# Patient Record
Sex: Female | Born: 1995 | Race: White | Hispanic: No | Smoking: Current every day smoker
Health system: Southern US, Community
[De-identification: ages and names within clinical notes are randomized; demographics above are authoritative.]

## PROBLEM LIST (undated history)

## (undated) DIAGNOSIS — M4650 Other infective spondylopathies, site unspecified: Secondary | ICD-10-CM

## (undated) DIAGNOSIS — F419 Anxiety disorder, unspecified: Secondary | ICD-10-CM

## (undated) DIAGNOSIS — N390 Urinary tract infection, site not specified: Secondary | ICD-10-CM

## (undated) DIAGNOSIS — N611 Abscess of the breast and nipple: Secondary | ICD-10-CM

## (undated) DIAGNOSIS — Z8679 Personal history of other diseases of the circulatory system: Secondary | ICD-10-CM

## (undated) DIAGNOSIS — F4310 Post-traumatic stress disorder, unspecified: Secondary | ICD-10-CM

## (undated) DIAGNOSIS — T7840XA Allergy, unspecified, initial encounter: Secondary | ICD-10-CM

## (undated) DIAGNOSIS — R87629 Unspecified abnormal cytological findings in specimens from vagina: Secondary | ICD-10-CM

## (undated) DIAGNOSIS — N61 Mastitis without abscess: Principal | ICD-10-CM

## (undated) DIAGNOSIS — J45909 Unspecified asthma, uncomplicated: Secondary | ICD-10-CM

## (undated) DIAGNOSIS — J189 Pneumonia, unspecified organism: Secondary | ICD-10-CM

## (undated) DIAGNOSIS — F32A Depression, unspecified: Secondary | ICD-10-CM

## (undated) HISTORY — DX: Unspecified asthma, uncomplicated: J45.909

## (undated) HISTORY — DX: Unspecified abnormal cytological findings in specimens from vagina: R87.629

## (undated) HISTORY — DX: Anxiety disorder, unspecified: F41.9

## (undated) HISTORY — DX: Other infective spondylopathies, site unspecified: M46.50

## (undated) HISTORY — PX: MYRINGOTOMY: SUR874

## (undated) HISTORY — DX: Personal history of other diseases of the circulatory system: Z86.79

## (undated) HISTORY — DX: Mastitis without abscess: N61.0

## (undated) HISTORY — DX: Abscess of the breast and nipple: N61.1

## (undated) HISTORY — DX: Urinary tract infection, site not specified: N39.0

## (undated) HISTORY — DX: Depression, unspecified: F32.A

## (undated) HISTORY — PX: NO PAST SURGERIES: SHX2092

## (undated) HISTORY — DX: Post-traumatic stress disorder, unspecified: F43.10

## (undated) HISTORY — DX: Allergy, unspecified, initial encounter: T78.40XA

---
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
## 2013-01-18 ENCOUNTER — Ambulatory Visit (INDEPENDENT_AMBULATORY_CARE_PROVIDER_SITE_OTHER): Payer: No Typology Code available for payment source | Admitting: Obstetrics & Gynecology

## 2013-01-18 ENCOUNTER — Encounter: Payer: Self-pay | Admitting: Obstetrics & Gynecology

## 2013-01-18 VITALS — BP 120/60 | Wt 177.0 lb

## 2013-01-18 DIAGNOSIS — O99891 Other specified diseases and conditions complicating pregnancy: Secondary | ICD-10-CM

## 2013-01-18 DIAGNOSIS — O99330 Smoking (tobacco) complicating pregnancy, unspecified trimester: Secondary | ICD-10-CM

## 2013-01-18 DIAGNOSIS — Z331 Pregnant state, incidental: Secondary | ICD-10-CM

## 2013-01-18 DIAGNOSIS — Z1389 Encounter for screening for other disorder: Secondary | ICD-10-CM

## 2013-01-18 DIAGNOSIS — Z3403 Encounter for supervision of normal first pregnancy, third trimester: Secondary | ICD-10-CM

## 2013-01-18 LAB — POCT URINALYSIS DIPSTICK
Blood, UA: NEGATIVE
Ketones, UA: NEGATIVE
Leukocytes, UA: NEGATIVE

## 2013-01-18 NOTE — Progress Notes (Signed)
BP weight and urine results all reviewed and noted. Patient reports good fetal movement, denies any bleeding and no rupture of membranes symptoms or regular contractions. Patient is without complaints. All questions were answered.  

## 2013-02-01 ENCOUNTER — Encounter: Payer: Self-pay | Admitting: Obstetrics & Gynecology

## 2013-02-01 ENCOUNTER — Ambulatory Visit (INDEPENDENT_AMBULATORY_CARE_PROVIDER_SITE_OTHER): Payer: No Typology Code available for payment source | Admitting: Obstetrics & Gynecology

## 2013-02-01 VITALS — BP 140/70 | Wt 179.0 lb

## 2013-02-01 DIAGNOSIS — Z3403 Encounter for supervision of normal first pregnancy, third trimester: Secondary | ICD-10-CM

## 2013-02-01 DIAGNOSIS — Z1389 Encounter for screening for other disorder: Secondary | ICD-10-CM

## 2013-02-01 DIAGNOSIS — O99019 Anemia complicating pregnancy, unspecified trimester: Secondary | ICD-10-CM

## 2013-02-01 LAB — POCT URINALYSIS DIPSTICK
Glucose, UA: NEGATIVE
Leukocytes, UA: NEGATIVE
Nitrite, UA: NEGATIVE

## 2013-02-01 NOTE — Progress Notes (Signed)
BP weight and urine results all reviewed and noted. Patient reports good fetal movement, denies any bleeding and no rupture of membranes symptoms or regular contractions. Patient is without complaints. All questions were answered.  

## 2013-02-01 NOTE — Patient Instructions (Signed)

## 2013-02-14 ENCOUNTER — Ambulatory Visit (INDEPENDENT_AMBULATORY_CARE_PROVIDER_SITE_OTHER): Payer: No Typology Code available for payment source | Admitting: Obstetrics and Gynecology

## 2013-02-14 VITALS — BP 128/60 | Wt 181.8 lb

## 2013-02-14 DIAGNOSIS — Z331 Pregnant state, incidental: Secondary | ICD-10-CM

## 2013-02-14 DIAGNOSIS — O2390 Unspecified genitourinary tract infection in pregnancy, unspecified trimester: Secondary | ICD-10-CM

## 2013-02-14 DIAGNOSIS — O99019 Anemia complicating pregnancy, unspecified trimester: Secondary | ICD-10-CM

## 2013-02-14 DIAGNOSIS — Z3400 Encounter for supervision of normal first pregnancy, unspecified trimester: Secondary | ICD-10-CM

## 2013-02-14 DIAGNOSIS — Z1389 Encounter for screening for other disorder: Secondary | ICD-10-CM

## 2013-02-14 LAB — POCT URINALYSIS DIPSTICK
Nitrite, UA: NEGATIVE
Protein, UA: NEGATIVE

## 2013-02-14 NOTE — Patient Instructions (Signed)
Fetal Movement Counts Patient Name: __________________________________________________ Patient Due Date: ____________________ Performing a fetal movement count is highly recommended in high-risk pregnancies, but it is good for every pregnant woman to do. Your caregiver may ask you to start counting fetal movements at 28 weeks of the pregnancy. Fetal movements often increase:  After eating a full meal.  After physical activity.  After eating or drinking something sweet or cold.  At rest. Pay attention to when you feel the baby is most active. This will help you notice a pattern of your baby's sleep and wake cycles and what factors contribute to an increase in fetal movement. It is important to perform a fetal movement count at the same time each day when your baby is normally most active.  HOW TO COUNT FETAL MOVEMENTS 1. Find a quiet and comfortable area to sit or lie down on your left side. Lying on your left side provides the best blood and oxygen circulation to your baby. 2. Write down the day and time on a sheet of paper or in a journal. 3. Start counting kicks, flutters, swishes, rolls, or jabs in a 2 hour period. You should feel at least 10 movements within 2 hours. 4. If you do not feel 10 movements in 2 hours, wait 2 3 hours and count again. Look for a change in the pattern or not enough counts in 2 hours. SEEK MEDICAL CARE IF:  You feel less than 10 counts in 2 hours, tried twice.  There is no movement in over an hour.  The pattern is changing or taking longer each day to reach 10 counts in 2 hours.  You feel the baby is not moving as he or she usually does. Date: ____________ Movements: ____________ Start time: ____________ Finish time: ____________  Date: ____________ Movements: ____________ Start time: ____________ Finish time: ____________ Date: ____________ Movements: ____________ Start time: ____________ Finish time: ____________ Date: ____________ Movements: ____________  Start time: ____________ Finish time: ____________ Date: ____________ Movements: ____________ Start time: ____________ Finish time: ____________ Date: ____________ Movements: ____________ Start time: ____________ Finish time: ____________ Date: ____________ Movements: ____________ Start time: ____________ Finish time: ____________ Date: ____________ Movements: ____________ Start time: ____________ Finish time: ____________  Date: ____________ Movements: ____________ Start time: ____________ Finish time: ____________ Date: ____________ Movements: ____________ Start time: ____________ Finish time: ____________ Date: ____________ Movements: ____________ Start time: ____________ Finish time: ____________ Date: ____________ Movements: ____________ Start time: ____________ Finish time: ____________ Date: ____________ Movements: ____________ Start time: ____________ Finish time: ____________ Date: ____________ Movements: ____________ Start time: ____________ Finish time: ____________ Date: ____________ Movements: ____________ Start time: ____________ Finish time: ____________  Date: ____________ Movements: ____________ Start time: ____________ Finish time: ____________ Date: ____________ Movements: ____________ Start time: ____________ Finish time: ____________ Date: ____________ Movements: ____________ Start time: ____________ Finish time: ____________ Date: ____________ Movements: ____________ Start time: ____________ Finish time: ____________ Date: ____________ Movements: ____________ Start time: ____________ Finish time: ____________ Date: ____________ Movements: ____________ Start time: ____________ Finish time: ____________ Date: ____________ Movements: ____________ Start time: ____________ Finish time: ____________  Date: ____________ Movements: ____________ Start time: ____________ Finish time: ____________ Date: ____________ Movements: ____________ Start time: ____________ Finish time:  ____________ Date: ____________ Movements: ____________ Start time: ____________ Finish time: ____________ Date: ____________ Movements: ____________ Start time: ____________ Finish time: ____________ Date: ____________ Movements: ____________ Start time: ____________ Finish time: ____________ Date: ____________ Movements: ____________ Start time: ____________ Finish time: ____________ Date: ____________ Movements: ____________ Start time: ____________ Finish time: ____________  Date: ____________ Movements: ____________ Start time: ____________ Finish   time: ____________ Date: ____________ Movements: ____________ Start time: ____________ Finish time: ____________ Date: ____________ Movements: ____________ Start time: ____________ Finish time: ____________ Date: ____________ Movements: ____________ Start time: ____________ Finish time: ____________ Date: ____________ Movements: ____________ Start time: ____________ Finish time: ____________ Date: ____________ Movements: ____________ Start time: ____________ Finish time: ____________ Date: ____________ Movements: ____________ Start time: ____________ Finish time: ____________  Date: ____________ Movements: ____________ Start time: ____________ Finish time: ____________ Date: ____________ Movements: ____________ Start time: ____________ Finish time: ____________ Date: ____________ Movements: ____________ Start time: ____________ Finish time: ____________ Date: ____________ Movements: ____________ Start time: ____________ Finish time: ____________ Date: ____________ Movements: ____________ Start time: ____________ Finish time: ____________ Date: ____________ Movements: ____________ Start time: ____________ Finish time: ____________ Date: ____________ Movements: ____________ Start time: ____________ Finish time: ____________  Date: ____________ Movements: ____________ Start time: ____________ Finish time: ____________ Date: ____________ Movements:  ____________ Start time: ____________ Finish time: ____________ Date: ____________ Movements: ____________ Start time: ____________ Finish time: ____________ Date: ____________ Movements: ____________ Start time: ____________ Finish time: ____________ Date: ____________ Movements: ____________ Start time: ____________ Finish time: ____________ Date: ____________ Movements: ____________ Start time: ____________ Finish time: ____________ Date: ____________ Movements: ____________ Start time: ____________ Finish time: ____________  Date: ____________ Movements: ____________ Start time: ____________ Finish time: ____________ Date: ____________ Movements: ____________ Start time: ____________ Finish time: ____________ Date: ____________ Movements: ____________ Start time: ____________ Finish time: ____________ Date: ____________ Movements: ____________ Start time: ____________ Finish time: ____________ Date: ____________ Movements: ____________ Start time: ____________ Finish time: ____________ Date: ____________ Movements: ____________ Start time: ____________ Finish time: ____________ Document Released: 08/31/2006 Document Revised: 07/18/2012 Document Reviewed: 05/28/2012 [employer] Patient Information 2014 [employer].  

## 2013-02-14 NOTE — Progress Notes (Signed)
C/o of white vaginal discharge with irritation and itching, states "thinks I have a UTI." GBS and GC/CHL today

## 2013-02-15 LAB — URINALYSIS, ROUTINE W REFLEX MICROSCOPIC
Bilirubin Urine: NEGATIVE
Ketones, ur: NEGATIVE mg/dL
Specific Gravity, Urine: 1.009 (ref 1.005–1.030)
Urobilinogen, UA: 0.2 mg/dL (ref 0.0–1.0)
pH: 7.5 (ref 5.0–8.0)

## 2013-02-15 LAB — URINALYSIS, MICROSCOPIC ONLY
Bacteria, UA: NONE SEEN
Casts: NONE SEEN

## 2013-02-15 LAB — OB RESULTS CONSOLE GC/CHLAMYDIA: Chlamydia: NEGATIVE

## 2013-02-16 LAB — STREP B DNA PROBE: GBSP: NEGATIVE

## 2013-02-19 ENCOUNTER — Telehealth: Payer: Self-pay | Admitting: Obstetrics and Gynecology

## 2013-02-19 MED ORDER — NYSTATIN-TRIAMCINOLONE 100000-0.1 UNIT/GM-% EX OINT: TOPICAL_OINTMENT | Freq: Two times a day (BID) | CUTANEOUS | Status: DC

## 2013-02-19 MED ORDER — FLUCONAZOLE 150 MG PO TABS: 150.0000 mg | ORAL_TABLET | Freq: Once | ORAL | Status: DC

## 2013-02-19 NOTE — Addendum Note (Signed)
Addended by: Tilda Burrow on: 02/19/2013 08:57 AM   Modules accepted: Kipp Brood

## 2013-02-19 NOTE — Progress Notes (Signed)
Culture positive for Yeast, will rx Diflucan and Mytrex/Mycolog. Pt to be notified by telephone jvf

## 2013-02-19 NOTE — Telephone Encounter (Signed)
Pt aware of Rx's for yeast infxn.

## 2013-02-22 ENCOUNTER — Encounter: Payer: Self-pay | Admitting: Obstetrics and Gynecology

## 2013-02-22 ENCOUNTER — Ambulatory Visit (INDEPENDENT_AMBULATORY_CARE_PROVIDER_SITE_OTHER): Payer: No Typology Code available for payment source | Admitting: Obstetrics and Gynecology

## 2013-02-22 VITALS — BP 120/72 | Wt 180.4 lb

## 2013-02-22 DIAGNOSIS — Z1389 Encounter for screening for other disorder: Secondary | ICD-10-CM

## 2013-02-22 DIAGNOSIS — Z3493 Encounter for supervision of normal pregnancy, unspecified, third trimester: Secondary | ICD-10-CM

## 2013-02-22 DIAGNOSIS — O99019 Anemia complicating pregnancy, unspecified trimester: Secondary | ICD-10-CM

## 2013-02-22 LAB — POCT URINALYSIS DIPSTICK
Blood, UA: NEGATIVE
Glucose, UA: NEGATIVE
Nitrite, UA: NEGATIVE

## 2013-02-22 NOTE — Patient Instructions (Signed)
Pain Relief During Labor and Delivery All women are different when it comes to having pain. The amount of pain that a woman experiences during labor and delivery depends on their pain tolerance, contraction strength and the baby's size and the position.  There are many ways to prepare and deal with the pain. These ways include:  Taking prenatal classes to learn about labor and delivery. The more informed you are, the less anxious and afraid you may be, which can help lessen the pain.  Taking medication or anesthetics during labor and delivery.  Women preferring natural childbirth learn breathing and relaxation techniques and how to relax between contractions to control their pain. They may take a shower or bath, have their partner massage or place an ice pack on their back, or may just want to change positions during labor. METHODS AVAILABLE TO CONTROL PAIN:  Medications may be given by injection into the muscles or vein (intravenously) to ease the pain. These medications can be given along with another medication (barbiturates) to relax you. These medications keep you awake. There can be minor side effects such as nausea, trouble concentrating, becoming sleepy and lowering the heart rate of the baby. However, the dose given will not seriously affect the baby.  Paracervical block is given during labor by injecting numbing medication into the right and left sides of the cervix and vagina. This is found to relieve most of the labor pains. It may have to be given more than once.  A local anesthetic may be injected under the skin, on the outside of the vagina, in the perineal area to perform a small cut (episiotomy). This cut is done to prevent tearing of the vagina at the time of delivery.  Pudendal anesthesia is an injection given deep through the perineum into the area to the pudendal nerves. This numbs the outside of the vagina and perineum at the time of delivery.  Epidural anesthesia is an  injection of numbing medication into the area of the lower back and spinal column. This anesthetic is injected on the outside covering of the spinal cord. An epidural numbs the lower part of the body. It can be given continuously in small doses through a tube for prolonged anesthesia. It can also be used to do a Cesarean section. You are able to move your legs, but not allowed to walk. Epidural is a very common and popular form of anesthesia during labor and delivery. It should only be given by a trained and experienced anesthesiologist or anesthetist. Side effects may occur, such as:  Headache.  Drop in blood pressure.  Backache.  Dizziness.  Difficulty breathing if it affects your chest muscles.  Shivering.  Slowing down the contractions.  Require forceps or vacuum extraction delivery.  Higher incidence of Cesarean Section.  Rarely it can cause a convulsion.  Spinal block anesthesia is an injection of numbing medication into the lower back and spinal column area. However, this anesthetic is injected on the inside of the covering of the spinal cord. Spinal block anesthesia is only given once. Therefore, it is best to be given just before delivering the baby. It can also be used for Cesarean sections. The side effects are the same as the epidural side effects. It should only be given by a trained anesthesiologist or anesthetist. Whether a person takes medication or not during labor and delivery will depend on the needs of each individual and situation. Do not be afraid to ask for pain medication if you need  it. There is no reason to be ashamed, embarrassed or feel that you failed yourself if you take medication. You should discuss how you and your caregiver plan to control your pain during your prenatal visits. It may also be a good idea to talk to an anesthesiologist before your due date. Document Released: 11/17/2008 Document Revised: 10/24/2011 Document Reviewed: 11/17/2008 [facility]  Patient Information 2014 [place], [place].

## 2013-02-22 NOTE — Progress Notes (Signed)
Pt here today for routine visiemt. Pt states she can't sleep at night. Pt denies any other problems or issues at this time.

## 2013-02-22 NOTE — Progress Notes (Signed)
Good fm, - bleeding ,- dischg,-SROM, + Braxton Hicks q1h... A stable preg [redacted]w[redacted]d. GBS neg  P: routine pnc. jvf

## 2013-02-28 ENCOUNTER — Ambulatory Visit (INDEPENDENT_AMBULATORY_CARE_PROVIDER_SITE_OTHER): Payer: No Typology Code available for payment source | Admitting: Obstetrics & Gynecology

## 2013-02-28 ENCOUNTER — Encounter: Payer: Self-pay | Admitting: Obstetrics & Gynecology

## 2013-02-28 VITALS — BP 138/78 | Wt 183.5 lb

## 2013-02-28 DIAGNOSIS — Z331 Pregnant state, incidental: Secondary | ICD-10-CM

## 2013-02-28 DIAGNOSIS — Z1389 Encounter for screening for other disorder: Secondary | ICD-10-CM

## 2013-02-28 DIAGNOSIS — O99019 Anemia complicating pregnancy, unspecified trimester: Secondary | ICD-10-CM

## 2013-02-28 LAB — POCT URINALYSIS DIPSTICK
Blood, UA: NEGATIVE
Glucose, UA: NEGATIVE
Ketones, UA: NEGATIVE

## 2013-02-28 NOTE — Patient Instructions (Signed)
Labor Induction  Most women go into labor on their own between 37 and 42 weeks of the pregnancy. When this does not happen or when there is a medical need, medicine or other methods may be used to induce labor. Labor induction causes a pregnant woman's uterus to contract. It also causes the cervix to soften (ripen), open (dilate), and thin out (efface). Usually, labor is not induced before 39 weeks of the pregnancy unless there is a problem with the baby or mother. Whether your labor will be induced depends on a number of factors, including the following:  The medical condition of you and the baby.  How many weeks along you are.  The status of baby's lung maturity.  The condition of the cervix.  The position of the baby. REASONS FOR LABOR INDUCTION  The health of the baby or mother is at risk.  The pregnancy is overdue by 1 week or more.  The water breaks but labor does not start on its own.  The mother has a health condition or serious illness such as high blood pressure, infection, placental abruption, or diabetes.  The amniotic fluid amounts are low around the baby.  The baby is distressed. REASONS TO NOT INDUCE LABOR Labor induction may not be a good idea if:  It is shown that your baby does not tolerate labor.  An induction is just more convenient.  You want the baby to be born on a certain date, like a holiday.  You have had previous surgeries on your uterus, such as a myomectomy or the removal of fibroids.  Your placenta lies very low in the uterus and blocks the opening of the cervix (placenta previa).  Your baby is not in a head down position.  The umbilical cord drops down into the birth canal in front of the baby. This could cut off the baby's blood and oxygen supply.  You have had a previous cesarean delivery.  There areunusual circumstances, such as the baby being extremely premature. RISKS AND COMPLICATIONS Problems may occur in the process of induction  and plans may need to be modified as a situation unfolds. Some of the risks of induction include:  Change in fetal heart rate, such as too high, too low, or erratic.  Risk of fetal distress.  Risk of infection to mother and baby.  Increased chance of having a cesarean delivery.  The rare, but increased chance that the placenta will separate from the uterus (abruption).  Uterine rupture (very rare). When induction is needed for medical reasons, the benefits of induction may outweigh the risks. BEFORE THE PROCEDURE Your caregiver will check your cervix and the baby's position. This will help your caregiver decide if you are far enough along for an induction to work. PROCEDURE Several methods of labor induction may be used, such as:   Taking prostaglandin medicine to dilate and ripen the cervix. The medicine will also start contractions. It can be taken by mouth or by inserting a suppository into the vagina.  A thin tube (catheter) with a balloon on the end may be inserted into your vagina to dilate the cervix. Once inserted, the balloon expands with water, which causes the cervix to open.  Striping the membranes. Your caregiver inserts a finger between the cervix and membranes, which causes the cervix to be stretched and may cause the uterus to contract. This is often done during an office visit. You will be sent home to wait for the contractions to begin. You will   then come in for an induction.  Breaking the water. Your caregiver will make a hole in the amniotic sac using a small instrument. Once the amniotic sac breaks, contractions should begin. This may still take hours to see an effect.  Taking medicine to trigger or strengthen contractions. This medicine is given intravenously through a tube in your arm. All of the methods of induction, besides stripping the membranes, will be done in the hospital. Induction is done in the hospital so that you and the baby can be carefully  monitored. AFTER THE PROCEDURE Some inductions can take up to 2 or 3 days. Depending on the cervix, it usually takes less time. It takes longer when you are induced early in the pregnancy or if this is your first pregnancy. If a mother is still pregnant and the induction has been going on for 2 to 3 days, either the mother will be sent home or a cesarean delivery will be needed. Document Released: 12/21/2006 Document Revised: 10/24/2011 Document Reviewed: 06/06/2011 [employer] Patient Information 2014 [employer].  

## 2013-02-28 NOTE — Progress Notes (Signed)
Recheck BP 138/78 BP weight and urine results all reviewed and noted. Patient reports good fetal movement, denies any bleeding and no rupture of membranes symptoms or regular contractions. Patient is without complaints. All questions were answered.

## 2013-03-07 ENCOUNTER — Other Ambulatory Visit: Payer: Self-pay | Admitting: *Deleted

## 2013-03-08 ENCOUNTER — Encounter: Payer: No Typology Code available for payment source | Admitting: Obstetrics and Gynecology

## 2013-03-09 MED ORDER — NYSTATIN-TRIAMCINOLONE 100000-0.1 UNIT/GM-% EX OINT: TOPICAL_OINTMENT | Freq: Two times a day (BID) | CUTANEOUS | Status: DC

## 2013-03-11 ENCOUNTER — Ambulatory Visit (INDEPENDENT_AMBULATORY_CARE_PROVIDER_SITE_OTHER): Payer: No Typology Code available for payment source | Admitting: Obstetrics and Gynecology

## 2013-03-11 VITALS — BP 150/60 | Wt 185.5 lb

## 2013-03-11 DIAGNOSIS — Z331 Pregnant state, incidental: Secondary | ICD-10-CM

## 2013-03-11 DIAGNOSIS — Z3403 Encounter for supervision of normal first pregnancy, third trimester: Secondary | ICD-10-CM

## 2013-03-11 DIAGNOSIS — O99019 Anemia complicating pregnancy, unspecified trimester: Secondary | ICD-10-CM

## 2013-03-11 DIAGNOSIS — Z1389 Encounter for screening for other disorder: Secondary | ICD-10-CM

## 2013-03-11 LAB — POCT URINALYSIS DIPSTICK
Blood, UA: NEGATIVE
Ketones, UA: NEGATIVE
Protein, UA: NEGATIVE

## 2013-03-11 NOTE — Patient Instructions (Signed)

## 2013-03-11 NOTE — Progress Notes (Signed)
C/o irregular contractions with pain. Excited re delivery. C/o  Mild headaches , chronic, over the last 2 weeks, along with nausea. No Ruq pain.  Generous contractions in evenings. BP RECHECK  150/70. Will check PIH labs.  Rechk 1 day. 10 am

## 2013-03-12 ENCOUNTER — Encounter: Payer: Self-pay | Admitting: Women's Health

## 2013-03-12 ENCOUNTER — Telehealth: Payer: Self-pay | Admitting: Obstetrics and Gynecology

## 2013-03-12 ENCOUNTER — Ambulatory Visit (INDEPENDENT_AMBULATORY_CARE_PROVIDER_SITE_OTHER): Payer: No Typology Code available for payment source | Admitting: Women's Health

## 2013-03-12 VITALS — BP 142/66 | Wt 183.0 lb

## 2013-03-12 DIAGNOSIS — O99320 Drug use complicating pregnancy, unspecified trimester: Secondary | ICD-10-CM

## 2013-03-12 DIAGNOSIS — O4400 Placenta previa specified as without hemorrhage, unspecified trimester: Secondary | ICD-10-CM

## 2013-03-12 DIAGNOSIS — O99019 Anemia complicating pregnancy, unspecified trimester: Secondary | ICD-10-CM

## 2013-03-12 DIAGNOSIS — Z1389 Encounter for screening for other disorder: Secondary | ICD-10-CM

## 2013-03-12 DIAGNOSIS — O99330 Smoking (tobacco) complicating pregnancy, unspecified trimester: Secondary | ICD-10-CM

## 2013-03-12 DIAGNOSIS — F1920 Other psychoactive substance dependence, uncomplicated: Secondary | ICD-10-CM

## 2013-03-12 DIAGNOSIS — O210 Mild hyperemesis gravidarum: Secondary | ICD-10-CM

## 2013-03-12 LAB — POCT URINALYSIS DIPSTICK
Leukocytes, UA: NEGATIVE
Nitrite, UA: NEGATIVE
Protein, UA: NEGATIVE

## 2013-03-12 LAB — COMPREHENSIVE METABOLIC PANEL
AST: 11 U/L (ref 0–37)
Alkaline Phosphatase: 222 U/L — ABNORMAL HIGH (ref 39–117)
BUN: 5 mg/dL — ABNORMAL LOW (ref 6–23)
Calcium: 8.7 mg/dL (ref 8.4–10.5)
Chloride: 103 mEq/L (ref 96–112)
Creat: 0.47 mg/dL — ABNORMAL LOW (ref 0.50–1.10)
Total Bilirubin: 0.3 mg/dL (ref 0.3–1.2)

## 2013-03-12 LAB — CBC
HCT: 28.7 % — ABNORMAL LOW (ref 36.0–46.0)
MCH: 24.6 pg — ABNORMAL LOW (ref 26.0–34.0)
MCHC: 32.8 g/dL (ref 30.0–36.0)
MCV: 75.1 fL — ABNORMAL LOW (ref 78.0–100.0)
RDW: 16.1 % — ABNORMAL HIGH (ref 11.5–15.5)

## 2013-03-12 LAB — PROTEIN / CREATININE RATIO, URINE: Protein Creatinine Ratio: 0.06 (ref <0.15)

## 2013-03-12 NOTE — Progress Notes (Signed)
Pt here today to recheck BP. Pt states she is still having irregular contractions and a lot of discharge. Pt denies any gush of fluid and bleeding.

## 2013-03-12 NOTE — Progress Notes (Signed)
Spoke with Dr. Emelda Fear and he advised that he would look over the pt's chart and call her, and probably send her down to [facility] this afternoon. The pt is aware of the plan and went home to rest, until Dr. Emelda Fear calls.

## 2013-03-12 NOTE — Telephone Encounter (Signed)
Dr. Emelda Fear spoke with pt concerning B/P today and induction this weekend.

## 2013-03-13 ENCOUNTER — Telehealth (HOSPITAL_COMMUNITY): Payer: Self-pay | Admitting: *Deleted

## 2013-03-13 ENCOUNTER — Encounter (HOSPITAL_COMMUNITY): Payer: Self-pay | Admitting: *Deleted

## 2013-03-13 ENCOUNTER — Inpatient Hospital Stay (HOSPITAL_COMMUNITY)
Admission: AD | Admit: 2013-03-13 | Discharge: 2013-03-13 | Disposition: A | Discharge status: Home / Self Care | Payer: No Typology Code available for payment source | Source: Ambulatory Visit | Attending: Obstetrics and Gynecology | Admitting: Obstetrics and Gynecology

## 2013-03-13 ENCOUNTER — Inpatient Hospital Stay (HOSPITAL_COMMUNITY)
Admission: AD | Admit: 2013-03-13 | Discharge: 2013-03-16 | DRG: 775 | Disposition: A | Discharge status: Home / Self Care | Payer: No Typology Code available for payment source | Source: Ambulatory Visit | Attending: Obstetrics & Gynecology | Admitting: Obstetrics & Gynecology

## 2013-03-13 DIAGNOSIS — O479 False labor, unspecified: Secondary | ICD-10-CM | POA: Insufficient documentation

## 2013-03-13 DIAGNOSIS — IMO0001 Reserved for inherently not codable concepts without codable children: Secondary | ICD-10-CM

## 2013-03-13 DIAGNOSIS — O99334 Smoking (tobacco) complicating childbirth: Secondary | ICD-10-CM | POA: Diagnosis present

## 2013-03-13 DIAGNOSIS — O139 Gestational [pregnancy-induced] hypertension without significant proteinuria, unspecified trimester: Secondary | ICD-10-CM | POA: Diagnosis present

## 2013-03-13 DIAGNOSIS — Z3403 Encounter for supervision of normal first pregnancy, third trimester: Secondary | ICD-10-CM

## 2013-03-13 LAB — URINALYSIS, ROUTINE W REFLEX MICROSCOPIC
Bilirubin Urine: NEGATIVE
Ketones, ur: NEGATIVE mg/dL
Nitrite: NEGATIVE
Specific Gravity, Urine: 1.01 (ref 1.005–1.030)
Urobilinogen, UA: 0.2 mg/dL (ref 0.0–1.0)

## 2013-03-13 LAB — URINE MICROSCOPIC-ADD ON

## 2013-03-13 MED ORDER — LACTATED RINGERS IV SOLN
INTRAVENOUS | Status: DC
  Administered 2013-03-14 (×2): via INTRAVENOUS

## 2013-03-13 MED ORDER — CITRIC ACID-SODIUM CITRATE 334-500 MG/5ML PO SOLN: 30.0000 mL | ORAL | Status: DC | PRN

## 2013-03-13 MED ORDER — OXYTOCIN BOLUS FROM INFUSION: 500.0000 mL | INTRAVENOUS | Status: DC

## 2013-03-13 MED ORDER — PROMETHAZINE HCL 25 MG/ML IJ SOLN
25.0000 mg | Freq: Once | INTRAMUSCULAR | Status: AC
  Administered 2013-03-13: 25 mg via INTRAMUSCULAR
  Filled 2013-03-13: qty 1

## 2013-03-13 MED ORDER — ACETAMINOPHEN 325 MG PO TABS: 650.0000 mg | ORAL_TABLET | ORAL | Status: DC | PRN

## 2013-03-13 MED ORDER — ALBUTEROL SULFATE HFA 108 (90 BASE) MCG/ACT IN AERS
2.0000 | INHALATION_SPRAY | Freq: Four times a day (QID) | RESPIRATORY_TRACT | Status: DC | PRN
  Filled 2013-03-13: qty 6.7

## 2013-03-13 MED ORDER — NALBUPHINE SYRINGE 5 MG/0.5 ML
10.0000 mg | INJECTION | INTRAMUSCULAR | Status: DC | PRN
  Administered 2013-03-14 (×2): 10 mg via INTRAVENOUS
  Filled 2013-03-13: qty 1
  Filled 2013-03-13: qty 0.5
  Filled 2013-03-13: qty 1

## 2013-03-13 MED ORDER — IBUPROFEN 600 MG PO TABS: 600.0000 mg | ORAL_TABLET | Freq: Four times a day (QID) | ORAL | Status: DC | PRN

## 2013-03-13 MED ORDER — LIDOCAINE HCL (PF) 1 % IJ SOLN
30.0000 mL | INTRAMUSCULAR | Status: AC | PRN
  Administered 2013-03-14: 30 mL via SUBCUTANEOUS
  Filled 2013-03-13 (×2): qty 30

## 2013-03-13 MED ORDER — MORPHINE SULFATE 10 MG/ML IJ SOLN
10.0000 mg | Freq: Once | INTRAMUSCULAR | Status: AC
  Administered 2013-03-13: 10 mg via INTRAMUSCULAR
  Filled 2013-03-13: qty 1

## 2013-03-13 MED ORDER — ONDANSETRON HCL 4 MG/2ML IJ SOLN: 4.0000 mg | Freq: Four times a day (QID) | INTRAMUSCULAR | Status: DC | PRN

## 2013-03-13 MED ORDER — LACTATED RINGERS IV SOLN
500.0000 mL | INTRAVENOUS | Status: DC | PRN
  Administered 2013-03-14: 500 mL via INTRAVENOUS

## 2013-03-13 MED ORDER — OXYCODONE-ACETAMINOPHEN 5-325 MG PO TABS: 1.0000 | ORAL_TABLET | ORAL | Status: DC | PRN

## 2013-03-13 MED ORDER — OXYTOCIN 40 UNITS IN LACTATED RINGERS INFUSION - SIMPLE MED: 62.5000 mL/h | INTRAVENOUS | Status: DC

## 2013-03-13 NOTE — Telephone Encounter (Signed)
Preadmission screen  

## 2013-03-13 NOTE — MAU Note (Signed)
Pt states has been contracting snce 2000

## 2013-03-13 NOTE — MAU Provider Note (Addendum)
Patient having contractions q 8 mins, pt very umcomfortable c contractions NO srom, + lite show on perineum Cx: 2-3 /80/-2 no blood on glove Fhr Cat I Bp 140 / 66 reflexes 3+ no clonus Willl check ua, protien/creatinine ratio

## 2013-03-13 NOTE — MAU Note (Signed)
Pt presents with increased contractions q 5- 10 minutessince 12 pm today after 5 am evaluation in MAU. Pt denies srom, some bleeding notes.  Pt with+ fm.  Pt with mild headache.

## 2013-03-13 NOTE — H&P (Signed)
Krystal Valdez is a 17 y.o. female presenting for contractions for past 2 days, worsening gradually. No leakage of fluid or bleeding. Baby moving well.  Receives care at [facility]. Has had elevated BP x 3 in office since 7/17 - 140-150/60-80s. Denies headache, vision changes, RUQ pain. Scheduled for induction in 2 days. Normal 2 hour GTT and ultrasound. Smoker. GBS negative.  Maternal Medical History:  Reason for admission: Contractions.   Contractions: Frequency: regular.   Perceived severity is strong.    Fetal activity: Perceived fetal activity is normal.   Last perceived fetal movement was within the past hour.      OB History   Grav Para Term Preterm Abortions TAB SAB Ect Mult Living   1              Past Medical History  Diagnosis Date  . Asthma   . Allergy    Past Surgical History  Procedure Laterality Date  . Myringotomy      bilateral, x 2   Family History: family history includes Diabetes in her paternal grandfather; Hyperlipidemia in her father; and Hypertension in her father. Social History:  reports that she has been smoking Cigarettes.  She has a 4 pack-year smoking history. She has never used smokeless tobacco. She reports that she does not drink alcohol or use illicit drugs.   Prenatal Transfer Tool  Maternal Diabetes: No Genetic Screening: Declined Maternal Ultrasounds/Referrals: Normal Fetal Ultrasounds or other Referrals:  None Maternal Substance Abuse:  Yes:  Type: Smoker Significant Maternal Medications:  None Significant Maternal Lab Results:  Lab values include: Group B Strep negative Other Comments:  None  ROS  See HPI  Dilation: 3 Effacement (%): 80 Station: -3 Exam by:: Dr Thad Ranger Blood pressure 129/64, pulse 80, temperature 97.9 F (36.6 C), temperature source Oral, resp. rate 18, height 5\' 7"  (1.702 m), weight 83.008 kg (183 lb), last menstrual period 06/02/2012. Exam Filed Vitals:   03/13/13 2035 03/13/13 2139 03/13/13 2305 03/14/13  0046  BP:  132/87 129/64 127/62  Pulse:  85 80 73  Temp:    98.2 F (36.8 C)  TempSrc:    Oral  Resp:  18 18 18   Height: 5\' 7"  (1.702 m)     Weight: 83.008 kg (183 lb)       Physical Exam  GEN:  WNWD, mild distress with contractions HEENT:  NCAT, EOMI, conjunctiva clear NECK:  Supple, non-tender, no thyromegaly, trachea midline CV: RRR, no murmur RESP:  CTAB ABD:  Soft, non-tender, no guarding or rebound, normal bowel sounds EXTREM:  Warm, well perfused, no edema or tenderness NEURO:  Alert, oriented, no focal deficits  Dilation: 3 Effacement (%): 80 Cervical Position: Posterior Station: -3 Presentation: Vertex Exam by:: Dr Thad Ranger Change from 2 cm earlier in visit   Prenatal labs: ABO, Rh:   Antibody: NEG (05/16 0906) Rubella:   RPR: NON REAC (05/16 0906)  HBsAg:    HIV: NON REACTIVE (05/16 0906)  GBS: NEGATIVE (07/03 1622)   Assessment/Plan: 17 y.o. G1P0 at [redacted]w[redacted]d with SOL - small cervical change and painful regular contractions - FHTs reactive - GHTN - urine protein:creatinine ratio pending, asymptomatic - Admit to L&D - Epidural when desired, pitocin as needed - Anticipate SVD  Napoleon Form 03/13/2013, 11:14 PM

## 2013-03-14 ENCOUNTER — Inpatient Hospital Stay (HOSPITAL_COMMUNITY): Payer: No Typology Code available for payment source | Admitting: Anesthesiology

## 2013-03-14 ENCOUNTER — Encounter (HOSPITAL_COMMUNITY): Payer: Self-pay | Admitting: *Deleted

## 2013-03-14 ENCOUNTER — Encounter (HOSPITAL_COMMUNITY): Payer: Self-pay | Admitting: Anesthesiology

## 2013-03-14 DIAGNOSIS — O99334 Smoking (tobacco) complicating childbirth: Secondary | ICD-10-CM

## 2013-03-14 DIAGNOSIS — O139 Gestational [pregnancy-induced] hypertension without significant proteinuria, unspecified trimester: Secondary | ICD-10-CM

## 2013-03-14 LAB — CBC
HCT: 30.7 % — ABNORMAL LOW (ref 36.0–46.0)
MCH: 25.4 pg — ABNORMAL LOW (ref 26.0–34.0)
MCV: 78.1 fL (ref 78.0–100.0)
Platelets: 252 10*3/uL (ref 150–400)
RDW: 15.4 % (ref 11.5–15.5)

## 2013-03-14 LAB — PROTEIN / CREATININE RATIO, URINE: Total Protein, Urine: 4.4 mg/dL

## 2013-03-14 LAB — RPR: RPR Ser Ql: NONREACTIVE

## 2013-03-14 MED ORDER — DIBUCAINE 1 % RE OINT: 1.0000 "application " | TOPICAL_OINTMENT | RECTAL | Status: DC | PRN

## 2013-03-14 MED ORDER — WITCH HAZEL-GLYCERIN EX PADS: 1.0000 "application " | MEDICATED_PAD | CUTANEOUS | Status: DC | PRN

## 2013-03-14 MED ORDER — EPHEDRINE 5 MG/ML INJ
10.0000 mg | INTRAVENOUS | Status: DC | PRN
  Filled 2013-03-14: qty 4
  Filled 2013-03-14: qty 2

## 2013-03-14 MED ORDER — PRENATAL MULTIVITAMIN CH
1.0000 | ORAL_TABLET | Freq: Every day | ORAL | Status: DC
  Administered 2013-03-16: 1 via ORAL
  Filled 2013-03-14: qty 1

## 2013-03-14 MED ORDER — DIPHENHYDRAMINE HCL 25 MG PO CAPS: 25.0000 mg | ORAL_CAPSULE | Freq: Four times a day (QID) | ORAL | Status: DC | PRN

## 2013-03-14 MED ORDER — OXYCODONE-ACETAMINOPHEN 5-325 MG PO TABS
1.0000 | ORAL_TABLET | ORAL | Status: DC | PRN
  Administered 2013-03-15 (×2): 1 via ORAL
  Filled 2013-03-14 (×3): qty 1

## 2013-03-14 MED ORDER — LANOLIN HYDROUS EX OINT: TOPICAL_OINTMENT | CUTANEOUS | Status: DC | PRN

## 2013-03-14 MED ORDER — TETANUS-DIPHTH-ACELL PERTUSSIS 5-2.5-18.5 LF-MCG/0.5 IM SUSP
0.5000 mL | Freq: Once | INTRAMUSCULAR | Status: AC
  Administered 2013-03-15: 0.5 mL via INTRAMUSCULAR
  Filled 2013-03-14: qty 0.5

## 2013-03-14 MED ORDER — ONDANSETRON HCL 4 MG PO TABS: 4.0000 mg | ORAL_TABLET | ORAL | Status: DC | PRN

## 2013-03-14 MED ORDER — ONDANSETRON HCL 4 MG/2ML IJ SOLN: 4.0000 mg | INTRAMUSCULAR | Status: DC | PRN

## 2013-03-14 MED ORDER — NALBUPHINE SYRINGE 5 MG/0.5 ML
10.0000 mg | INJECTION | INTRAMUSCULAR | Status: DC | PRN
  Filled 2013-03-14: qty 1

## 2013-03-14 MED ORDER — SIMETHICONE 80 MG PO CHEW: 80.0000 mg | CHEWABLE_TABLET | ORAL | Status: DC | PRN

## 2013-03-14 MED ORDER — ZOLPIDEM TARTRATE 5 MG PO TABS: 5.0000 mg | ORAL_TABLET | Freq: Every evening | ORAL | Status: DC | PRN

## 2013-03-14 MED ORDER — LIDOCAINE HCL (PF) 1 % IJ SOLN
INTRAMUSCULAR | Status: DC | PRN
  Administered 2013-03-14 (×2): 5 mL

## 2013-03-14 MED ORDER — BENZOCAINE-MENTHOL 20-0.5 % EX AERO
1.0000 "application " | INHALATION_SPRAY | CUTANEOUS | Status: DC | PRN
  Administered 2013-03-14 – 2013-03-16 (×2): 1 via TOPICAL
  Filled 2013-03-14 (×2): qty 56

## 2013-03-14 MED ORDER — OXYTOCIN 40 UNITS IN LACTATED RINGERS INFUSION - SIMPLE MED
1.0000 m[IU]/min | INTRAVENOUS | Status: DC
  Administered 2013-03-14: 2 m[IU]/min via INTRAVENOUS
  Filled 2013-03-14: qty 1000

## 2013-03-14 MED ORDER — IBUPROFEN 600 MG PO TABS
600.0000 mg | ORAL_TABLET | Freq: Four times a day (QID) | ORAL | Status: DC
  Administered 2013-03-14 – 2013-03-16 (×8): 600 mg via ORAL
  Filled 2013-03-14 (×9): qty 1

## 2013-03-14 MED ORDER — TERBUTALINE SULFATE 1 MG/ML IJ SOLN: 0.2500 mg | Freq: Once | INTRAMUSCULAR | Status: DC | PRN

## 2013-03-14 MED ORDER — EPHEDRINE 5 MG/ML INJ
10.0000 mg | INTRAVENOUS | Status: DC | PRN
  Filled 2013-03-14: qty 2

## 2013-03-14 MED ORDER — DIPHENHYDRAMINE HCL 50 MG/ML IJ SOLN: 12.5000 mg | INTRAMUSCULAR | Status: DC | PRN

## 2013-03-14 MED ORDER — SENNOSIDES-DOCUSATE SODIUM 8.6-50 MG PO TABS
2.0000 | ORAL_TABLET | Freq: Every day | ORAL | Status: DC
  Administered 2013-03-15: 2 via ORAL

## 2013-03-14 MED ORDER — LACTATED RINGERS IV SOLN
500.0000 mL | Freq: Once | INTRAVENOUS | Status: AC
  Administered 2013-03-14: 500 mL via INTRAVENOUS

## 2013-03-14 MED ORDER — FENTANYL 2.5 MCG/ML BUPIVACAINE 1/10 % EPIDURAL INFUSION (WH - ANES)
14.0000 mL/h | INTRAMUSCULAR | Status: DC | PRN
  Administered 2013-03-14 (×2): 14 mL/h via EPIDURAL
  Filled 2013-03-14 (×2): qty 125

## 2013-03-14 MED ORDER — PHENYLEPHRINE 40 MCG/ML (10ML) SYRINGE FOR IV PUSH (FOR BLOOD PRESSURE SUPPORT)
80.0000 ug | PREFILLED_SYRINGE | INTRAVENOUS | Status: DC | PRN
  Filled 2013-03-14: qty 2

## 2013-03-14 MED ORDER — PHENYLEPHRINE 40 MCG/ML (10ML) SYRINGE FOR IV PUSH (FOR BLOOD PRESSURE SUPPORT)
80.0000 ug | PREFILLED_SYRINGE | INTRAVENOUS | Status: DC | PRN
  Filled 2013-03-14: qty 2
  Filled 2013-03-14: qty 5

## 2013-03-14 NOTE — H&P (Signed)
Attestation of Attending Supervision of Advanced Practitioner: Evaluation and management procedures were performed by the PA/NP/CNM/OB Fellow under my supervision/collaboration. Chart reviewed and agree with management and plan.  Torrell Krutz V 03/14/2013 1:16 PM

## 2013-03-14 NOTE — Anesthesia Preprocedure Evaluation (Signed)

## 2013-03-14 NOTE — Anesthesia Postprocedure Evaluation (Signed)
  Anesthesia Post-op Note  Patient: Krystal Valdez  Procedure(s) Performed: * No procedures listed *  Patient Location: PACU and Mother/Baby  Anesthesia Type:Epidural  Level of Consciousness: awake, alert  and oriented  Airway and Oxygen Therapy: Patient Spontanous Breathing  Post-op Pain: none  Post-op Assessment: Post-op Vital signs reviewed, Patient's Cardiovascular Status Stable, No headache, No backache, No residual numbness and No residual motor weakness  Post-op Vital Signs: Reviewed and stable  Complications: No apparent anesthesia complications

## 2013-03-14 NOTE — Progress Notes (Signed)
Krystal Valdez is a 17 y.o. G1P0 at [redacted]w[redacted]d by LMP admitted for early labor  Subjective: Pt on pitocin, received early epidural,    Objective: BP 113/53  Pulse 71  Temp(Src) 98 F (36.7 C) (Oral)  Resp 18  Ht 5\' 7"  (1.702 m)  Wt 83.008 kg (183 lb)  BMI 28.66 kg/m2  SpO2 97%  LMP 06/02/2012      FHT:  FHR: 145 bpm, variability: moderate,  accelerations:  Present,  decelerations:  Absent UC:   irregular, every 4 minutes SVE:   Dilation: 3 Effacement (%): 80 Station: -2 Exam by:: Dr Thad Ranger Exam at 5:10 am, 4/ 90/-1/-2 jvf Labs: Lab Results  Component Value Date   WBC 14.9* 03/13/2013   HGB 10.0* 03/13/2013   HCT 30.7* 03/13/2013   MCV 78.1 03/13/2013   PLT 252 03/13/2013    Assessment / Plan: Augmentation of labor, progressing well  Labor: Progressing on Pitocin, will continue to increase then AROM and Arom clear at 5:10 am Preeclampsia:   Fetal Wellbeing:  Category I Pain Control:  Epidural I/D:   Anticipated MOD:  NSVD  Curties Conigliaro V 03/14/2013, 5:11 AM

## 2013-03-14 NOTE — Progress Notes (Signed)
Krystal Valdez is a 17 y.o. G1P0 at [redacted]w[redacted]d by LMP admitted for early labor  Subjective: Pt on pitocin, received early epidural,  Making change having pain.  Objective: Filed Vitals:   03/14/13 0932  BP: 127/70  Pulse: 121  Temp:   Resp: 20        FHT:  FHR: 130s bpm, variability: moderate,  accelerations:  Present,  decelerations:  Absent UC:   regular q2-43min SVE:   Dilation: Lip/rim Effacement (%): 100 Cervical Position: Anterior Station: 0 Presentation: Vertex Exam by:: Dr Krystal Valdez    Labs: Lab Results  Component Value Date   WBC 14.9* 03/13/2013   HGB 10.0* 03/13/2013   HCT 30.7* 03/13/2013   MCV 78.1 03/13/2013   PLT 252 03/13/2013    Assessment / Plan: Active labor.   Labor: near complete. Infant moving down. Cont current management Preeclampsia:  Normal BP Fetal Wellbeing:  Category I Pain Control:  Epidural well controlled I/D:  GBS NEg Anticipated MOD:  NSVD  Krystal Scale, MD OB Fellow

## 2013-03-14 NOTE — Anesthesia Procedure Notes (Signed)
Epidural Patient location during procedure: OB Start time: 03/14/2013 4:06 AM  Staffing Anesthesiologist: Angus Seller., Harrell Gave. Performed by: anesthesiologist   Preanesthetic Checklist Completed: patient identified, site marked, surgical consent, pre-op evaluation, timeout performed, IV checked, risks and benefits discussed and monitors and equipment checked  Epidural Patient position: sitting Prep: site prepped and draped and DuraPrep Patient monitoring: continuous pulse ox and blood pressure Approach: midline Injection technique: LOR air and LOR saline  Needle:  Needle type: Tuohy  Needle gauge: 17 G Needle length: 9 cm and 9 Needle insertion depth: 5 cm cm Catheter type: closed end flexible Catheter size: 19 Gauge Catheter at skin depth: 10 cm Test dose: negative  Assessment Events: blood not aspirated, injection not painful, no injection resistance, negative IV test and no paresthesia  Additional Notes Patient identified.  Risk benefits discussed including failed block, incomplete pain control, headache, nerve damage, paralysis, blood pressure changes, nausea, vomiting, reactions to medication both toxic or allergic, and postpartum back pain.  Patient expressed understanding and wished to proceed.  All questions were answered.  Sterile technique used throughout procedure and epidural site dressed with sterile barrier dressing. No paresthesia or other complications noted.The patient did not experience any signs of intravascular injection such as tinnitus or metallic taste in mouth nor signs of intrathecal spread such as rapid motor block. Please see nursing notes for vital signs.

## 2013-03-15 MED ORDER — PNEUMOCOCCAL VAC POLYVALENT 25 MCG/0.5ML IJ INJ
0.5000 mL | INJECTION | INTRAMUSCULAR | Status: DC
  Filled 2013-03-15: qty 0.5

## 2013-03-15 NOTE — Progress Notes (Signed)
Post Partum Day 1 Subjective: up ad lib, voiding, tolerating PO and + flatus. Back pain at epidural site, no complaints of headache when standing up. Still sore and burns when she pees, I told her this is to be expected with the repair she had.   Objective: Blood pressure 114/54, pulse 88, temperature 97.3 F (36.3 C), temperature source Oral, resp. rate 18, height 5\' 7"  (1.702 m), weight 83.008 kg (183 lb), last menstrual period 06/02/2012, SpO2 100.00%, unknown if currently breastfeeding.  Physical Exam:  General: alert, cooperative and no distress Lochia: appropriate Uterine Fundus: firm DVT Evaluation: No evidence of DVT seen on physical exam. No cords or calf tenderness. No significant calf/ankle edema.   Recent Labs  03/13/13 0005  HGB 10.0*  HCT 30.7*    Assessment/Plan: Plan for discharge tomorrow and Contraception depo  Patient may want to go home today, will decide later.   LOS: 2 days   Krystal Valdez 03/15/2013, 7:46 AM

## 2013-03-15 NOTE — Progress Notes (Signed)
I spoke with and examined patient and agree with resident's note and plan of care. Plan to d/c tomorrow Tawana Scale, MD OB Fellow 03/15/2013 12:33 PM

## 2013-03-16 ENCOUNTER — Inpatient Hospital Stay (HOSPITAL_COMMUNITY): Admission: RE | Admit: 2013-03-16 | Payer: No Typology Code available for payment source | Source: Ambulatory Visit

## 2013-03-16 MED ORDER — BENZOCAINE-MENTHOL 20-0.5 % EX AERO: 1.0000 "application " | INHALATION_SPRAY | CUTANEOUS | Status: DC | PRN

## 2013-03-16 MED ORDER — IBUPROFEN 600 MG PO TABS: 600.0000 mg | ORAL_TABLET | Freq: Four times a day (QID) | ORAL | Status: DC

## 2013-03-16 MED ORDER — OXYCODONE-ACETAMINOPHEN 5-325 MG PO TABS: 1.0000 | ORAL_TABLET | ORAL | Status: DC | PRN

## 2013-03-16 NOTE — Discharge Summary (Signed)
Obstetric Discharge Summary Reason for Admission: onset of labor Prenatal Procedures: none Intrapartum Procedures: spontaneous vaginal delivery Postpartum Procedures: none Complications-Operative and Postpartum: 2 degree perineal laceration Hemoglobin  Date Value Range Status  03/13/2013 10.0* 12.0 - 15.0 g/dL Final  1/61/0960 45.4  12.2 - 16.2 g/dL Final     HCT  Date Value Range Status  03/13/2013 30.7* 36.0 - 46.0 % Final     HCT, POC  Date Value Range Status  09/25/2011 37.2* 37.7 - 47.9 % Final    Physical Exam:  General: alert Lochia: appropriate Uterine Fundus: firm Incision: n/a DVT Evaluation: No evidence of DVT seen on physical exam.  Discharge Diagnoses: Term Pregnancy-delivered  Discharge Information: Date: 03/16/2013 Activity: pelvic rest Diet: routine Medications: PNV, Ibuprofen and Percocet Condition: stable Instructions: refer to practice specific booklet Discharge to: home Follow-up Information   Follow up with [facility]. Schedule an appointment as soon as possible for a visit in 1 week. (for depo provera)    Contact information:   [address] [place] [address] [phone]      Newborn Data: Live born female  Birth Weight: 7 lb 12 oz (3515 g) APGAR: 9, 9  Home with mother She will get her depo provera next week at [facility].  Gresham Caetano C. 03/16/2013, 9:03 AM

## 2013-03-18 ENCOUNTER — Telehealth: Payer: Self-pay | Admitting: Adult Health

## 2013-03-18 ENCOUNTER — Telehealth: Payer: Self-pay | Admitting: *Deleted

## 2013-03-18 NOTE — Telephone Encounter (Signed)
Pt requesting a work note for [employer], father of baby, to excuse for work on days 07/28-08/01 due to pt delivering. Note faxed to [phone].

## 2013-03-22 NOTE — Telephone Encounter (Signed)
Called pt with no answer. Close encounter

## 2013-03-27 ENCOUNTER — Ambulatory Visit: Payer: No Typology Code available for payment source | Admitting: Adult Health

## 2013-03-27 ENCOUNTER — Telehealth: Payer: Self-pay | Admitting: Obstetrics & Gynecology

## 2013-03-27 NOTE — Telephone Encounter (Signed)
Pt c/o red, swollen nipple with heat to touch on left breast. Pt states cold cabbage leafs and support bra not helping. Appointment made for pt to be seen by Cyril Mourning, NP.

## 2013-03-28 ENCOUNTER — Ambulatory Visit (INDEPENDENT_AMBULATORY_CARE_PROVIDER_SITE_OTHER): Payer: No Typology Code available for payment source | Admitting: Adult Health

## 2013-03-28 ENCOUNTER — Encounter: Payer: Self-pay | Admitting: Adult Health

## 2013-03-28 VITALS — BP 130/80 | Temp 98.8°F | Ht 67.0 in | Wt 168.0 lb

## 2013-03-28 DIAGNOSIS — N61 Mastitis without abscess: Secondary | ICD-10-CM

## 2013-03-28 DIAGNOSIS — O26899 Other specified pregnancy related conditions, unspecified trimester: Secondary | ICD-10-CM

## 2013-03-28 HISTORY — DX: Mastitis without abscess: N61.0

## 2013-03-28 MED ORDER — AMOXICILLIN-POT CLAVULANATE 875-125 MG PO TABS: 1.0000 | ORAL_TABLET | Freq: Two times a day (BID) | ORAL | Status: DC

## 2013-03-28 MED ORDER — OXYCODONE-ACETAMINOPHEN 7.5-325 MG PO TABS: 1.0000 | ORAL_TABLET | ORAL | Status: DC | PRN

## 2013-03-28 NOTE — Progress Notes (Signed)
Subjective:     Patient ID: Krystal Valdez, female   DOB: [birth year], 17 y.o.   MRN: [id number]  HPI Krystal Valdez is a 17 year old white female who delivered 7/31, she is not breastfeeding in today complaining of pain left breast,redness and green discharge from nipple.  Review of Systems Positives in HPI Reviewed past medical,surgical, social and family history. Reviewed medications and allergies.     Objective:   Physical Exam BP 130/80  Temp(Src) 98.8 F (37.1 C)  Ht 5\' 7"  (1.702 m)  Wt 168 lb (76.204 kg)  BMI 26.31 kg/m2  LMP 06/02/2012  Breastfeeding? No   Right breast no masses,retatction has milky discharge, on the left there is a thickness at 8-9 0'clock and redness there,no retraction but she does have a greenish thick discharge can be expressed from the nipple with pressure.Dr Emelda Fear in to co-examine.  Assessment:     Left Mastitis    Plan:    Check TSH and prolactin Rx Augmentin 875-125 mg 1 bid x 14 days #28 no refills Rx percocet 7.5/325 mg #40 1 every 4 hours prn pain no refills Use cabbage leaves,tight bra,massage and express Follow up in 4 days Review hand out on mastitis    Has postpartum 04/16/13 and she wants Depo

## 2013-03-28 NOTE — Patient Instructions (Addendum)
Breastfeeding, Mastitis  Breastfeeding is usually the best way to feed your baby. Breastfed babies tend to be healthier and less affected by disease. Breastfed babies may have better brain development and be less likely to be overweight than formula-fed babies. Breastfeeding also benefits the mother. Breastfeeding reduces the risk of breast cancer. It will give you time to be close to your baby and helps create a strong bond. It also delays the return of your periods, stimulates your uterus to contract back to normal, and helps you lose some of the weight you gained during pregnancy.  Breastfeeding should not be painful. If there is tenderness at first, it should gradually go away as the days go by. Poor latch-on and positioning are common causes of sore nipples. This is usually because the baby is not getting enough of the areola (the colored portion around the nipple) into his or her mouth, and is sucking mostly on the nipple. In general, nurse early and often, nurse with the nipple and areola in the baby's mouth, not just the nipple and feed your baby on demand.  CAUSES   It is common for many women to have a plugged duct in the breast at some point if she breastfeeds. A plugged milk duct feels like a tender, sore, lump in the breast. It is not accompanied by a fever or other symptoms. It happens when a milk duct does not properly drain, and the breast becomes inflamed (red and sore). Then, pressure builds up behind the plug. A plugged duct usually only occurs in one breast at a time.   A breast infection (mastitis), on the other hand, is soreness or a lump in the breast that can be accompanied by a fever or flu-like symptoms, such as feeling run down or very achy. Some women with a breast infection also have nausea and vomiting. You also may have yellowish discharge from the nipple that looks like colostrum, or the breasts feel warm or hot to the touch and appear pink or red. A breast infection can occur when  other family members have a cold or the flu, and like a plugged duct, it usually only occurs in one breast. It is not always easy to tell the difference between a breast infection and a plugged duct because both have similar symptoms and can improve within 24 to 48 hours.   Using one position to breastfeed may not empty your breasts properly or completely and could lead to engorgement and mastitis.   Wearing a bra that is too tight may restrict the flow of your milk.   Mastitis develops because bacteria get under the skin through cracks in the nipple and into the breasts and an infection develops.  TREATMENT    Breastfeed or pump the breast more often on the affected side. This helps loosen the plug, keeps the milk moving freely, and the breast from becoming overly full. Nursing every 2 hours, both day and night on the affected side first can be helpful.   Getting extra sleep or relaxing with your feet up can help speed healing. Often, a plugged duct or breast infection is the first sign that a mother is doing too much and becoming overly tired.   Wear a well-fitting, supportive bra that is not too tight, since this can constrict milk ducts.   If you do not feel better within 24 hours of trying these steps, and you have a fever or your symptoms worsen, call your caregiver. You may need an   or there is pus or blood in the milk, red streaks near the painful area, or your symptoms came suddenly, see your caregiver right away.  Even if you need an antibiotic, continuing to breastfeed during treatment is best for both you and your baby. Most antibiotics passed in your breast milk will not hurt your baby.  Increase your fluid intake especially if you have a fever.  If mastitis is not treated quickly and properly, you may develop a breast abscess. THRUSH  Thrush (yeast) is a fungal infection that can form on your nipples or in  your breast because it thrives on milk.  The infection forms from an overgrowth of the candida organism. Candida usually lives in our bodies and is kept at healthy levels by the natural bacteria in our bodies. But, when the natural balance of bacteria is upset, candida can overgrow, causing an infection. Some of the things that can cause thrush include:   Having an overly moist environment on your skin or nipples that are sore or cracked.  Taking antibiotics, birth control pills, or steroids.  Having a diet that contains large amounts of sugar or foods with yeast.  Women with diabetes have a higher incidence of thrush and fungal infections.  Having a chronic illness like HIV infection, diabetes, or anemia. These are symptoms of thrush:  Having sore nipples that last more than a few days, even when your baby's latch and positioning is correct.  Getting sore nipples suddenly after several weeks of normal nursing.  Pink, flaky, shiny, itchy, or cracked nipples, or deep pink and blistered nipples.  Shooting pains deep in the breast during or after feedings, or achy breasts. This may be from a candida infection of the milk ducts.  The infection can form in your baby's mouth from having contact with your nipples, and appear as little white spots on the inside of the cheeks, gums, or tongue. It also can appear as a diaper rash (small red dots around a rash) on your baby that will not go away by using regular diaper rash ointments. Many babies with thrush refuse to nurse, or are gassy or cranky. HOME CARE INSTRUCTIONS  If you or your baby have any of the above symptoms, contact your caregiver and your baby's caregiver so you both can be correctly diagnosed.  You can get medication for your nipples and for your baby. Medication for a mother may be an ointment for the nipples, and your baby can be given a liquid medication for his or her mouth, or an ointment for any diaper rash.  Change  disposable nursing pads often and wash any towels or clothing that have come in contact with the yeast in very hot water above 122 F (50 C).  Wear a clean bra every day.  Only use cotton pads.  Wash your hands often, and wash your baby's hands often, especially if he or she sucks on his or her fingers.  Boil any pacifiers, bottle nipples, or toys your baby puts in his or her mouth once a day for 20 minutes to kill the thrush. After 1 week of treatment, discard pacifiers and nipples and buy new ones. Boil all breast pump parts that touch the milk for 20 minutes daily.  Make sure other family members are free of thrush or other fungal infections. If they have symptoms, get treatment for them. MAKE SURE YOU:   Understand these instructions.  Will watch your condition.  Will get help right away if  you are not doing well or get worse. Document Released: 11/26/2004 Document Revised: 04/25/2012 Document Reviewed: 04/16/2008 [facility] Patient Information 2014 [place], [place]. Take Augmentin and percocet Follow up in 4 days

## 2013-03-29 ENCOUNTER — Telehealth: Payer: Self-pay | Admitting: Adult Health

## 2013-03-29 LAB — PROLACTIN: Prolactin: 49.9 ng/mL

## 2013-03-29 NOTE — Telephone Encounter (Signed)
Pt feels better, has some nausea still has phenergan ok to take if needed and can just take 1/2 of tab keep follow up as scheduled.

## 2013-04-01 ENCOUNTER — Ambulatory Visit: Payer: No Typology Code available for payment source | Admitting: Adult Health

## 2013-04-02 ENCOUNTER — Ambulatory Visit (INDEPENDENT_AMBULATORY_CARE_PROVIDER_SITE_OTHER): Payer: No Typology Code available for payment source | Admitting: Adult Health

## 2013-04-02 ENCOUNTER — Encounter: Payer: Self-pay | Admitting: Adult Health

## 2013-04-02 VITALS — BP 120/80 | Temp 98.2°F | Ht 67.0 in | Wt 168.0 lb

## 2013-04-02 DIAGNOSIS — N611 Abscess of the breast and nipple: Secondary | ICD-10-CM

## 2013-04-02 DIAGNOSIS — N61 Mastitis without abscess: Secondary | ICD-10-CM

## 2013-04-02 HISTORY — DX: Abscess of the breast and nipple: N61.1

## 2013-04-02 MED ORDER — ONDANSETRON HCL 8 MG PO TABS: 8.0000 mg | ORAL_TABLET | Freq: Two times a day (BID) | ORAL | Status: DC | PRN

## 2013-04-02 MED ORDER — SULFAMETHOXAZOLE-TMP DS 800-160 MG PO TABS: 1.0000 | ORAL_TABLET | Freq: Two times a day (BID) | ORAL | Status: DC

## 2013-04-02 MED ORDER — OXYCODONE-ACETAMINOPHEN 7.5-325 MG PO TABS: 1.0000 | ORAL_TABLET | ORAL | Status: DC | PRN

## 2013-04-02 NOTE — Patient Instructions (Addendum)
Stop Augmentin and start septra ds See Dr Lovell Sheehan 8/21 at 11 am and follow in 1 week

## 2013-04-02 NOTE — Progress Notes (Signed)
Subjective:     Patient ID: Krystal Valdez, female   DOB: 1996-01-27, 17 y.o.   MRN: [id number]  HPI Krystal Valdez is back for left breast mastitis, that is worse, she has pain and redness,and it is not draining now.Has pain under arm.   Review of Systems See HPI Reviewed past medical,surgical, social and family history. Reviewed medications and allergies.     Objective:   Physical Exam BP 120/80  Temp(Src) 98.2 F (36.8 C)  Ht 5\' 7"  (1.702 m)  Wt 168 lb (76.204 kg)  BMI 26.31 kg/m2  Breastfeeding? No   Breasts: right no dominate mass,retarction or nipple discharge, on left there is redness and swelling around areola, and the nipple has fullness and white pustule Dr Despina Hidden in to co examine.  Assessment:     Left breast abscess    Plan:    Refer to Dr Lovell Sheehan 8/21 at 11:15 am be there at 11 am, do not eat or drink that am before visit Stop Augmentin  Rx septra ds 1 bid x 14 days #28 no refills Refilled percocet 7.5/325 mg #40 no refills Rx Zofran 8 mg #20 1 every 12 hours prn n/v with 1 refill Follow up in 1 week

## 2013-04-04 ENCOUNTER — Encounter (HOSPITAL_COMMUNITY): Payer: Self-pay

## 2013-04-04 ENCOUNTER — Encounter (HOSPITAL_COMMUNITY)
Admission: RE | Admit: 2013-04-04 | Discharge: 2013-04-04 | Disposition: A | Discharge status: Home / Self Care | Payer: No Typology Code available for payment source | Source: Ambulatory Visit | Attending: General Surgery | Admitting: General Surgery

## 2013-04-04 ENCOUNTER — Inpatient Hospital Stay (HOSPITAL_COMMUNITY): Admission: RE | Admit: 2013-04-04 | Payer: Self-pay | Source: Ambulatory Visit

## 2013-04-04 LAB — HCG, SERUM, QUALITATIVE: Preg, Serum: NEGATIVE

## 2013-04-04 LAB — BASIC METABOLIC PANEL
BUN: 11 mg/dL (ref 6–23)
CO2: 26 mEq/L (ref 19–32)
Chloride: 98 mEq/L (ref 96–112)
GFR calc Af Amer: >90 mL/min (ref >90)
Glucose, Bld: 76 mg/dL (ref 70–99)
Potassium: 4.3 mEq/L (ref 3.5–5.1)

## 2013-04-04 LAB — CBC WITH DIFFERENTIAL/PLATELET
Hemoglobin: 10.1 g/dL — ABNORMAL LOW (ref 12.0–15.0)
Lymphocytes Relative: 24 % (ref 12–46)
Lymphs Abs: 2.3 10*3/uL (ref 0.7–4.0)
Monocytes Relative: 5 % (ref 3–12)
Neutro Abs: 6.4 10*3/uL (ref 1.7–7.7)
Neutrophils Relative %: 67 % (ref 43–77)
RBC: 4.1 MIL/uL (ref 3.87–5.11)

## 2013-04-04 NOTE — H&P (Signed)
  NTS SOAP Note  Vital Signs:  Vitals as of: 04/04/2013: Systolic 124: Diastolic 55: Heart Rate 94: Temp 96.8F: Height 5ft 7in: Weight 164Lbs 0 Ounces: Pain Level 8: BMI 25.69  BMI : 25.69 kg/m2  Subjective: This 16 Years 10 Months old Female presents for of a left breast abscess.  Has had left breast pain and swelling for about one week.  Initially started on an antibiotic, has not improved.  Followed by GYN.  Last night started to have drainage from the nipple, with some relief of the swelling.  Is three weeks postpartum, is not breastfeeding.  Review of Symptoms:  Constitutional:unremarkable   Head:unremarkable    Eyes:unremarkable   Nose/Mouth/Throat:unremarkable Cardiovascular:  unremarkable   Respiratory:unremarkable   Gastrointestinal:  unremarkable   Genitourinary:unremarkable     Musculoskeletal:unremarkable   Skin:unremarkable   left breast pain and drainage Hematolgic/Lymphatic:unremarkable     Allergic/Immunologic:unremarkable     Past Medical History:    Reviewed   Past Medical History  Surgical History: tubes in ears Medical Problems: none Allergies: nkda Medications: oxycodone, bactrum   Social History:Reviewed  Social History  Preferred Language: English Race:  White Ethnicity: Not Hispanic / Latino Age: 16 Years 10 Months Marital Status:  S Alcohol: socially Recreational drug(s):  No   Smoking Status: Current every day smoker reviewed on 04/04/2013 Started Date: 08/15/2001 Packs per day: 0.50 Functional Status reviewed on mm/dd/yyyy ------------------------------------------------ Bathing: Normal Cooking: Normal Dressing: Normal Driving: Normal Eating: Normal Managing Meds: Normal Oral Care: Normal Shopping: Normal Toileting: Normal Transferring: Normal Walking: Normal Cognitive Status reviewed on mm/dd/yyyy ------------------------------------------------ Attention: Normal Decision  Making: Normal Language: Normal Memory: Normal Motor: Normal Perception: Normal Problem Solving: Normal Visual and Spatial: Normal   Family History:  Reviewed  Family Health History Family History is Unknown    Objective Information: General:  Well appearing, well nourished in no distress. Heart:  RRR, no murmur Lungs:    CTA bilaterally, no wheezes, rhonchi, rales.  Breathing unlabored.     Left breast with posterior areolar swelling and induration.  Erythema present.  Not actively drainage, but exam limited secondary to pain.  Assessment:Left mastitis  Diagnosis &amp; Procedure Smart Code   Plan:To OR on 04/05/13 for incision and drainage of left breast abscess.   Patient Education:Alternative treatments to surgery were discussed with patient (and family).  Risks and benefits  of procedure were fully explained to the patient (and family) who gave informed consent. Patient/family questions were addressed.  Follow-up:Pending Surgery   

## 2013-04-04 NOTE — Patient Instructions (Addendum)
Your procedure is scheduled on: 04/05/2013  Report to [facility] at  6:15   AM.  Call this number if you have problems the morning of surgery: [phone]   Remember:   Do not drink or eat food:After Midnight.  :  Take these medicines the morning of surgery with A SIP OF WATER:    Do not wear jewelry, make-up or nail polish.  Do not wear lotions, powders, or perfumes. You may wear deodorant.  Do not shave 48 hours prior to surgery. Men may shave face and neck.  Do not bring valuables to the hospital.  Contacts, dentures or bridgework may not be worn into surgery.  Leave suitcase in the car. After surgery it may be brought to your room.  For patients admitted to the hospital, checkout time is 11:00 AM the day of discharge.   Patients discharged the day of surgery will not be allowed to drive home.    Special Instructions: Shower using CHG 2 nights before surgery and the night before surgery.  If you shower the day of surgery use CHG.  Use special wash - you have one bottle of CHG for all showers.  You should use approximately 1/3 of the bottle for each shower.   Please read over the following fact sheets that you were given: Pain Booklet, MRSA Information, Surgical Site Infection Prevention and Care and Recovery After Surgery  PATIENT INSTRUCTIONS POST-ANESTHESIA  IMMEDIATELY FOLLOWING SURGERY:  Do not drive or operate machinery for the first twenty four hours after surgery.  Do not make any important decisions for twenty four hours after surgery or while taking narcotic pain medications or sedatives.  If you develop intractable nausea and vomiting or a severe headache please notify your doctor immediately.  FOLLOW-UP:  Please make an appointment with your surgeon as instructed. You do not need to follow up with anesthesia unless specifically instructed to do so.  WOUND CARE INSTRUCTIONS (if applicable):  Keep a dry clean dressing on the anesthesia/puncture wound site if there is  drainage.  Once the wound has quit draining you may leave it open to air.  Generally you should leave the bandage intact for twenty four hours unless there is drainage.  If the epidural site drains for more than 36-48 hours please call the anesthesia department.  QUESTIONS?:  Please feel free to call your physician or the hospital operator if you have any questions, and they will be happy to assist you.      Incision and Drainage Care After Refer to this sheet in the next few weeks. These instructions provide you with information on caring for yourself after your procedure. Your caregiver may also give you more specific instructions. Your treatment has been planned according to current medical practices, but problems sometimes occur. Call your caregiver if you have any problems or questions after your procedure. HOME CARE INSTRUCTIONS   If antibiotic medicine is given, take it as directed. Finish it even if you start to feel better.  Only take over-the-counter or prescription medicines for pain, discomfort, or fever as directed by your caregiver.  Keep all follow-up appointments as directed by your caregiver.  Change any bandages (dressings) as directed by your caregiver. Replace old dressings with clean dressings.  Wash your hands before and after caring for your wound. You will receive specific instructions for cleansing and caring for your wound.  SEEK MEDICAL CARE IF:   You have increased pain, swelling, or redness around the wound.  You  have increased drainage, smell, or bleeding from the wound.  You have muscle aches, chills, or you feel generally sick.  You have a fever. MAKE SURE YOU:   Understand these instructions.  Will watch your condition.  Will get help right away if you are not doing well or get worse. Document Released: 10/24/2011 Document Reviewed: 10/24/2011 [facility] Patient Information 2014 [place], [place].

## 2013-04-05 ENCOUNTER — Ambulatory Visit (HOSPITAL_COMMUNITY)
Admission: RE | Admit: 2013-04-05 | Discharge: 2013-04-05 | Disposition: A | Discharge status: Home / Self Care | Payer: No Typology Code available for payment source | Source: Ambulatory Visit | Attending: General Surgery | Admitting: General Surgery

## 2013-04-05 ENCOUNTER — Encounter (HOSPITAL_COMMUNITY): Payer: Self-pay | Admitting: Anesthesiology

## 2013-04-05 ENCOUNTER — Encounter (HOSPITAL_COMMUNITY): Admission: RE | Payer: Self-pay | Source: Ambulatory Visit

## 2013-04-05 ENCOUNTER — Encounter (HOSPITAL_COMMUNITY): Payer: Self-pay | Admitting: *Deleted

## 2013-04-05 ENCOUNTER — Ambulatory Visit (HOSPITAL_COMMUNITY): Payer: No Typology Code available for payment source | Admitting: Anesthesiology

## 2013-04-05 ENCOUNTER — Ambulatory Visit (HOSPITAL_COMMUNITY): Admission: RE | Admit: 2013-04-05 | Payer: Self-pay | Source: Ambulatory Visit | Admitting: General Surgery

## 2013-04-05 ENCOUNTER — Encounter (HOSPITAL_COMMUNITY)
Admission: RE | Disposition: A | Discharge status: Home / Self Care | Payer: Self-pay | Source: Ambulatory Visit | Attending: General Surgery

## 2013-04-05 DIAGNOSIS — N61 Mastitis without abscess: Secondary | ICD-10-CM | POA: Insufficient documentation

## 2013-04-05 HISTORY — PX: INCISION AND DRAINAGE ABSCESS: SHX5864

## 2013-04-05 SURGERY — INCISION AND DRAINAGE, ABSCESS
Anesthesia: General | Site: Breast | Laterality: Left | Wound class: Dirty or Infected

## 2013-04-05 SURGERY — IRRIGATION AND DEBRIDEMENT ABSCESS
Anesthesia: General | Laterality: Left

## 2013-04-05 MED ORDER — FENTANYL CITRATE 0.05 MG/ML IJ SOLN
INTRAMUSCULAR | Status: DC | PRN
  Administered 2013-04-05: 50 ug via INTRAVENOUS

## 2013-04-05 MED ORDER — BUPIVACAINE HCL (PF) 0.5 % IJ SOLN
INTRAMUSCULAR | Status: AC
  Filled 2013-04-05: qty 30

## 2013-04-05 MED ORDER — CLINDAMYCIN PHOSPHATE 900 MG/50ML IV SOLN
900.0000 mg | Freq: Once | INTRAVENOUS | Status: AC
  Administered 2013-04-05: 900 mg via INTRAVENOUS

## 2013-04-05 MED ORDER — SODIUM CHLORIDE 0.9 % IR SOLN
Status: DC | PRN
  Administered 2013-04-05: 1000 mL

## 2013-04-05 MED ORDER — MIDAZOLAM HCL 2 MG/2ML IJ SOLN
INTRAMUSCULAR | Status: AC
  Filled 2013-04-05: qty 2

## 2013-04-05 MED ORDER — BUPIVACAINE HCL (PF) 0.5 % IJ SOLN
INTRAMUSCULAR | Status: DC | PRN
  Administered 2013-04-05: 3 mL

## 2013-04-05 MED ORDER — FENTANYL CITRATE 0.05 MG/ML IJ SOLN
INTRAMUSCULAR | Status: AC
  Filled 2013-04-05: qty 2

## 2013-04-05 MED ORDER — PROPOFOL 10 MG/ML IV EMUL
INTRAVENOUS | Status: AC
  Filled 2013-04-05: qty 20

## 2013-04-05 MED ORDER — CLINDAMYCIN PHOSPHATE 900 MG/50ML IV SOLN
INTRAVENOUS | Status: AC
  Filled 2013-04-05: qty 50

## 2013-04-05 MED ORDER — KETOROLAC TROMETHAMINE 30 MG/ML IJ SOLN
30.0000 mg | Freq: Once | INTRAMUSCULAR | Status: AC
  Administered 2013-04-05: 30 mg via INTRAVENOUS

## 2013-04-05 MED ORDER — OXYCODONE-ACETAMINOPHEN 7.5-325 MG PO TABS: 1.0000 | ORAL_TABLET | ORAL | Status: DC | PRN

## 2013-04-05 MED ORDER — MIDAZOLAM HCL 2 MG/2ML IJ SOLN
1.0000 mg | INTRAMUSCULAR | Status: DC | PRN
  Administered 2013-04-05: 2 mg via INTRAVENOUS

## 2013-04-05 MED ORDER — LIDOCAINE HCL 1 % IJ SOLN
INTRAMUSCULAR | Status: DC | PRN
  Administered 2013-04-05: 50 mg via INTRADERMAL

## 2013-04-05 MED ORDER — KETOROLAC TROMETHAMINE 30 MG/ML IJ SOLN
INTRAMUSCULAR | Status: AC
  Filled 2013-04-05: qty 1

## 2013-04-05 MED ORDER — PROPOFOL 10 MG/ML IV BOLUS
INTRAVENOUS | Status: DC | PRN
  Administered 2013-04-05: 150 mg via INTRAVENOUS

## 2013-04-05 MED ORDER — ONDANSETRON HCL 4 MG/2ML IJ SOLN: 4.0000 mg | Freq: Once | INTRAMUSCULAR | Status: DC | PRN

## 2013-04-05 MED ORDER — FENTANYL CITRATE 0.05 MG/ML IJ SOLN
25.0000 ug | INTRAMUSCULAR | Status: DC | PRN
  Administered 2013-04-05 (×4): 50 ug via INTRAVENOUS

## 2013-04-05 MED ORDER — FENTANYL CITRATE 0.05 MG/ML IJ SOLN
25.0000 ug | INTRAMUSCULAR | Status: AC
  Administered 2013-04-05 (×2): 25 ug via INTRAVENOUS

## 2013-04-05 MED ORDER — LACTATED RINGERS IV SOLN
INTRAVENOUS | Status: DC
  Administered 2013-04-05: 1000 mL via INTRAVENOUS

## 2013-04-05 MED ORDER — LIDOCAINE HCL (PF) 1 % IJ SOLN
INTRAMUSCULAR | Status: AC
  Filled 2013-04-05: qty 5

## 2013-04-05 SURGICAL SUPPLY — 30 items
BAG HAMPER (MISCELLANEOUS) ×2 IMPLANT
BANDAGE CONFORM 2  STR LF (GAUZE/BANDAGES/DRESSINGS) ×2 IMPLANT
BLADE SURG 15 STRL LF DISP TIS (BLADE) IMPLANT
BLADE SURG 15 STRL SS (BLADE) ×4
CLOTH BEACON ORANGE TIMEOUT ST (SAFETY) ×2 IMPLANT
COVER LIGHT HANDLE STERIS (MISCELLANEOUS) ×4 IMPLANT
ELECT REM PT RETURN 9FT ADLT (ELECTROSURGICAL) ×2
ELECTRODE REM PT RTRN 9FT ADLT (ELECTROSURGICAL) ×1 IMPLANT
GAUZE PACKING IODOFORM 1/2 (PACKING) ×1 IMPLANT
GLOVE BIOGEL M STRL SZ7.5 (GLOVE) ×2 IMPLANT
GLOVE BIOGEL PI IND STRL 7.5 (GLOVE) IMPLANT
GLOVE BIOGEL PI INDICATOR 7.5 (GLOVE) ×1
GLOVE ECLIPSE 7.0 STRL STRAW (GLOVE) ×1 IMPLANT
GLOVE EXAM NITRILE MD LF STRL (GLOVE) ×1 IMPLANT
GOWN STRL REIN XL XLG (GOWN DISPOSABLE) ×4 IMPLANT
KIT ROOM TURNOVER APOR (KITS) ×2 IMPLANT
MANIFOLD NEPTUNE II (INSTRUMENTS) ×2 IMPLANT
MARKER SKIN DUAL TIP RULER LAB (MISCELLANEOUS) ×2 IMPLANT
NS IRRIG 1000ML POUR BTL (IV SOLUTION) ×2 IMPLANT
PACK BASIC LIMB (CUSTOM PROCEDURE TRAY) IMPLANT
PACK MINOR (CUSTOM PROCEDURE TRAY) ×2 IMPLANT
PAD ABD 5X9 TENDERSORB (GAUZE/BANDAGES/DRESSINGS) IMPLANT
PAD ARMBOARD 7.5X6 YLW CONV (MISCELLANEOUS) ×2 IMPLANT
SET BASIN LINEN APH (SET/KITS/TRAYS/PACK) ×2 IMPLANT
SPONGE GAUZE 4X4 12PLY (GAUZE/BANDAGES/DRESSINGS) ×1 IMPLANT
SUT ETHILON 4 0 PS 2 18 (SUTURE) ×2 IMPLANT
SWAB CULTURE LIQ STUART DBL (MISCELLANEOUS) ×1 IMPLANT
SYR BULB IRRIGATION 50ML (SYRINGE) ×2 IMPLANT
SYR CONTROL 10ML LL (SYRINGE) ×1 IMPLANT
TAPE CLOTH SURG 4X10 WHT LF (GAUZE/BANDAGES/DRESSINGS) ×1 IMPLANT

## 2013-04-05 NOTE — Transfer of Care (Signed)
Immediate Anesthesia Transfer of Care Note  Patient: Krystal Valdez  Procedure(s) Performed: Procedure(s): INCISION AND DRAINAGE LEFT BREAST ABSCESS (Left)  Patient Location: PACU  Anesthesia Type:General  Level of Consciousness: awake, alert  and oriented  Airway & Oxygen Therapy: Patient Spontanous Breathing and Patient connected to nasal cannula oxygen  Post-op Assessment: Report given to PACU RN and Post -op Vital signs reviewed and stable  Post vital signs: Reviewed and stable  Complications: No apparent anesthesia complications

## 2013-04-05 NOTE — Op Note (Signed)
Patient:  Krystal Valdez  DOB:  03-Feb-1996  MRN:  [id number]   Preop Diagnosis:  Left breast abscess  Postop Diagnosis:  Same  Procedure:  Incision and drainage of left breast abscess  Surgeon:  Franky Macho, M.D.  Anes:  General  Indications:  Patient is an 17 year old white female who is 3 weeks postpartum who presents with a left breast abscess. She has been on oral antibiotics. She now presents for incision and drainage of left breast abscess. Risks and benefits of the procedure were fully explained to the patient, who gave informed consent.  Procedure note:  The patient is placed the supine position. After general anesthesia was administered, the left breast was prepped and draped using usual sterile technique with Betadine. Surgical site confirmation was performed.  A curvilinear incision was made along the superior aspect of the areolar border. The dissection was taken down to the retroareolar region. A small amount of purulent fluid was found. This was cultured and sent to microbiology. Mostly what was found was inflamed tissue. No large abscess cavity was found. The wound is irrigated normal saline. 0.5% Sensorcaine was instilled the surrounding wound. The wound was packed with iodoform new gauze. The skin was reapproximated loosely using 4-0 nylon interrupted sutures. A dry sterile dressing was then applied.  All tape and needle counts were correct at the end of the procedure. Patient was awakened and transferred to PACU in stable condition.  Complications:  None  EBL:  Minimal  Specimen:  Aerobic culture of left breast abscess

## 2013-04-05 NOTE — H&P (Signed)
  NTS SOAP Note  Vital Signs:  Vitals as of: 04/04/2013: Systolic 124: Diastolic 55: Heart Rate 94: Temp 96.5F: Height 70ft 7in: Weight 164Lbs 0 Ounces: Pain Level 8: BMI 25.69  BMI : 25.69 kg/m2  Subjective: This 17 Years 84 Months old Female presents for of a left breast abscess.  Has had left breast pain and swelling for about one week.  Initially started on an antibiotic, has not improved.  Followed by GYN.  Last night started to have drainage from the nipple, with some relief of the swelling.  Is three weeks postpartum, is not breastfeeding.  Review of Symptoms:  Constitutional:unremarkable   Head:unremarkable    Eyes:unremarkable   Nose/Mouth/Throat:unremarkable Cardiovascular:  unremarkable   Respiratory:unremarkable   Gastrointestinal:  unremarkable   Genitourinary:unremarkable     Musculoskeletal:unremarkable   Skin:unremarkable   left breast pain and drainage Hematolgic/Lymphatic:unremarkable     Allergic/Immunologic:unremarkable     Past Medical History:    Reviewed   Past Medical History  Surgical History: tubes in ears Medical Problems: none Allergies: nkda Medications: oxycodone, bactrum   Social History:Reviewed  Social History  Preferred Language: English Race:  White Ethnicity: Not Hispanic / Latino Age: 17 Years 10 Months Marital Status:  S Alcohol: socially Recreational drug(s):  No   Smoking Status: Current every day smoker reviewed on 04/04/2013 Started Date: 08/15/2001 Packs per day: 0.50 Functional Status reviewed on mm/dd/yyyy ------------------------------------------------ Bathing: Normal Cooking: Normal Dressing: Normal Driving: Normal Eating: Normal Managing Meds: Normal Oral Care: Normal Shopping: Normal Toileting: Normal Transferring: Normal Walking: Normal Cognitive Status reviewed on mm/dd/yyyy ------------------------------------------------ Attention: Normal Decision  Making: Normal Language: Normal Memory: Normal Motor: Normal Perception: Normal Problem Solving: Normal Visual and Spatial: Normal   Family History:  Reviewed  Family Health History Family History is Unknown    Objective Information: General:  Well appearing, well nourished in no distress. Heart:  RRR, no murmur Lungs:    CTA bilaterally, no wheezes, rhonchi, rales.  Breathing unlabored.     Left breast with posterior areolar swelling and induration.  Erythema present.  Not actively drainage, but exam limited secondary to pain.  Assessment:Left mastitis  Diagnosis &amp; Procedure Smart Code   Plan:To OR on 04/05/13 for incision and drainage of left breast abscess.   Patient Education:Alternative treatments to surgery were discussed with patient (and family).  Risks and benefits  of procedure were fully explained to the patient (and family) who gave informed consent. Patient/family questions were addressed.  Follow-up:Pending Surgery

## 2013-04-05 NOTE — Interval H&P Note (Signed)
History and Physical Interval Note:  04/05/2013 7:27 AM  Krystal Valdez  has presented today for surgery, with the diagnosis of left breast abscess  The various methods of treatment have been discussed with the patient and family. After consideration of risks, benefits and other options for treatment, the patient has consented to  Procedure(s): INCISION AND DRAINAGE ABSCESS (Left) as a surgical intervention .  The patient's history has been reviewed, patient examined, no change in status, stable for surgery.  I have reviewed the patient's chart and labs.  Questions were answered to the patient's satisfaction.     Franky Macho A

## 2013-04-05 NOTE — Progress Notes (Signed)
Wants to go home. Denies pain. Pt's mother at side. Newborn infant being held comforted by pt's mother. Newborn crying. No formula available. Pt wants to go home to feed baby. D/C instructions and rx given to pt. Voiced understanding.

## 2013-04-05 NOTE — Anesthesia Preprocedure Evaluation (Signed)
Anesthesia Evaluation  Patient identified by MRN, date of birth, ID band Patient awake    Reviewed: Allergy & Precautions, H&P , NPO status , Patient's Chart, lab work & pertinent test results  Airway Mallampati: II TM Distance: >3 FB     Dental  (+) Teeth Intact   Pulmonary asthma ,  breath sounds clear to auscultation        Cardiovascular negative cardio ROS  Rhythm:Regular Rate:Normal     Neuro/Psych    GI/Hepatic   Endo/Other    Renal/GU      Musculoskeletal   Abdominal   Peds  Hematology   Anesthesia Other Findings   Reproductive/Obstetrics                           Anesthesia Physical Anesthesia Plan  ASA: II  Anesthesia Plan: General   Post-op Pain Management:    Induction: Intravenous  Airway Management Planned: LMA  Additional Equipment:   Intra-op Plan:   Post-operative Plan: Extubation in OR  Informed Consent: I have reviewed the patients History and Physical, chart, labs and discussed the procedure including the risks, benefits and alternatives for the proposed anesthesia with the patient or authorized representative who has indicated his/her understanding and acceptance.     Plan Discussed with:   Anesthesia Plan Comments:         Anesthesia Quick Evaluation

## 2013-04-05 NOTE — Anesthesia Postprocedure Evaluation (Signed)
  Anesthesia Post-op Note  Patient: Krystal Valdez  Procedure(s) Performed: Procedure(s): INCISION AND DRAINAGE LEFT BREAST ABSCESS (Left)  Patient Location: PACU  Anesthesia Type:General  Level of Consciousness: awake, alert , oriented and patient cooperative  Airway and Oxygen Therapy: Patient Spontanous Breathing  Post-op Pain: mild  Post-op Assessment: Post-op Vital signs reviewed, Patient's Cardiovascular Status Stable, Respiratory Function Stable, Patent Airway, No signs of Nausea or vomiting, Adequate PO intake, Pain level controlled, No headache, No backache, No residual numbness and No residual motor weakness  Post-op Vital Signs: Reviewed and stable  Complications: No apparent anesthesia complications

## 2013-04-05 NOTE — Preoperative (Signed)
Beta Blockers   Reason not to administer Beta Blockers:Not Applicable 

## 2013-04-08 ENCOUNTER — Encounter (HOSPITAL_COMMUNITY): Payer: Self-pay | Admitting: General Surgery

## 2013-04-08 LAB — CULTURE, ROUTINE-ABSCESS

## 2013-04-09 ENCOUNTER — Ambulatory Visit: Payer: No Typology Code available for payment source | Admitting: Adult Health

## 2013-04-16 ENCOUNTER — Ambulatory Visit: Payer: No Typology Code available for payment source | Admitting: Women's Health

## 2013-04-29 ENCOUNTER — Encounter: Payer: Self-pay | Admitting: Women's Health

## 2013-04-29 ENCOUNTER — Ambulatory Visit (INDEPENDENT_AMBULATORY_CARE_PROVIDER_SITE_OTHER): Payer: No Typology Code available for payment source | Admitting: Women's Health

## 2013-04-29 VITALS — BP 110/80 | Ht 67.0 in | Wt 160.6 lb

## 2013-04-29 DIAGNOSIS — Z3400 Encounter for supervision of normal first pregnancy, unspecified trimester: Secondary | ICD-10-CM

## 2013-04-29 DIAGNOSIS — Z3202 Encounter for pregnancy test, result negative: Secondary | ICD-10-CM

## 2013-04-29 NOTE — Progress Notes (Signed)
Patient ID: Krystal Valdez, female   DOB: 1995/10/29, 17 y.o.   MRN: [id number] Subjective:    LILLIEN Valdez is a 17 y.o. [id number] Caucasian female who presents for a postpartum visit. She is 6 weeks postpartum following a spontaneous vaginal delivery. I have fully reviewed the prenatal and intrapartum course. The delivery was at 40.5 gestational weeks. Outcome: spontaneous vaginal delivery. Anesthesia: epidural. Postpartum course has been complicated by mastitis not resolved by antibiotics, had to have I&D in OR by Dr. Anna Genre ago, doing well now.  Baby's course has been uncomplicated. Baby is feeding by bottle. Bleeding lochia x 2.5wks, then menses began last Sun . Bowel function is constipation. Bladder function is normal. Patient is sexually active. Contraception method is condoms and requests depo provera, states will get at her PCP's office in [place] b/c it is closer.. Postpartum depression screening: negative.  The following portions of the patient's history were reviewed and updated as appropriate: allergies, current medications, past medical history, past surgical history and problem list.  Review of Systems Pertinent items are noted in HPI.   Filed Vitals:   04/29/13 1349  BP: 110/80  Height: 5\' 7"  (1.702 m)  Weight: 160 lb 9.6 oz (72.848 kg)    Objective:     General:  alert, cooperative and no distress   Breasts:  Incision healing well on Lt breast near nipple  Lungs: clear to auscultation bilaterally  Heart:  regular rate and rhythm  Abdomen: soft, nontender   Vulva: normal  Vagina: normal vagina  Cervix:  closed  Corpus: Well-involuted  Adnexa:  Non-palpable  Rectal Exam: No hemorrhoids        Assessment:   Normal postpartum exam 6 wks s/p SVD Resolved mastitis Depression screening Contraception counseling   Plan:   Contraception: condoms currently, plans on initiating depo provera at her PCPs office, to call to schedule Reviewed  constipation relief measures Follow up as needed.    Cheral Marker, CNM, WHNP-BC 04/29/2013 2:22 PM

## 2013-04-29 NOTE — Patient Instructions (Signed)
Constipation  Drink plenty of fluid, preferably water, throughout the day  Eat foods high in fiber such as fruits, vegetables, and grains  Exercise, such as walking, is a good way to keep your bowels regular  Drink warm fluids, especially warm prune juice, or decaf coffee  Eat a 1/2 cup of real oatmeal (not instant), 1/2 cup applesauce, and 1/2-1 cup warm prune juice every day  If needed, you may take Colace (docusate sodium) stool softener once or twice a day to help keep the stool soft. If you are pregnant, wait until you are out of your first trimester (12-14 weeks of pregnancy)  If you still are having problems with constipation, you may take Miralax once daily as needed to help keep your bowels regular.  If you are pregnant, wait until you are out of your first trimester (12-14 weeks of pregnancy)    Medroxyprogesterone injection [Contraceptive] What is this medicine? MEDROXYPROGESTERONE (me DROX ee proe JES te rone) contraceptive injections prevent pregnancy. They provide effective birth control for 3 months. Depo-subQ Provera 104 is also used for treating pain related to endometriosis. This medicine may be used for other purposes; ask your health care provider or pharmacist if you have questions. What should I tell my health care provider before I take this medicine? They need to know if you have any of these conditions: -frequently drink alcohol -asthma -blood vessel disease or a history of a blood clot in the lungs or legs -bone disease such as osteoporosis -breast cancer -diabetes -eating disorder (anorexia nervosa or bulimia) -high blood pressure -HIV infection or AIDS -kidney disease -liver disease -mental depression -migraine -seizures (convulsions) -stroke -tobacco smoker -vaginal bleeding -an unusual or allergic reaction to medroxyprogesterone, other hormones, medicines, foods, dyes, or preservatives -pregnant or trying to get pregnant -breast-feeding How  should I use this medicine? Depo-Provera Contraceptive injection is given into a muscle. Depo-subQ Provera 104 injection is given under the skin. These injections are given by a health care professional. You must not be pregnant before getting an injection. The injection is usually given during the first 5 days after the start of a menstrual period or 6 weeks after delivery of a baby. Talk to your pediatrician regarding the use of this medicine in children. Special care may be needed. These injections have been used in female children who have started having menstrual periods. Overdosage: If you think you have taken too much of this medicine contact a poison control center or emergency room at once. NOTE: This medicine is only for you. Do not share this medicine with others. What if I miss a dose? Try not to miss a dose. You must get an injection once every 3 months to maintain birth control. If you cannot keep an appointment, call and reschedule it. If you wait longer than 13 weeks between Depo-Provera contraceptive injections or longer than 14 weeks between Depo-subQ Provera 104 injections, you could get pregnant. Use another method for birth control if you miss your appointment. You may also need a pregnancy test before receiving another injection. What may interact with this medicine? Do not take this medicine with any of the following medications: -bosentan This medicine may also interact with the following medications: -aminoglutethimide -antibiotics or medicines for infections, especially rifampin, rifabutin, rifapentine, and griseofulvin -aprepitant -barbiturate medicines such as phenobarbital or primidone -bexarotene -carbamazepine -medicines for seizures like ethotoin, felbamate, oxcarbazepine, phenytoin, topiramate -modafinil -St. John's wort This list may not describe all possible interactions. Give your health care provider  a list of all the medicines, herbs, non-prescription drugs,  or dietary supplements you use. Also tell them if you smoke, drink alcohol, or use illegal drugs. Some items may interact with your medicine. What should I watch for while using this medicine? This drug does not protect you against HIV infection (AIDS) or other sexually transmitted diseases. Use of this product may cause you to lose calcium from your bones. Loss of calcium may cause weak bones (osteoporosis). Only use this product for more than 2 years if other forms of birth control are not right for you. The longer you use this product for birth control the more likely you will be at risk for weak bones. Ask your health care professional how you can keep strong bones. You may have a change in bleeding pattern or irregular periods. Many females stop having periods while taking this drug. If you have received your injections on time, your chance of being pregnant is very low. If you think you may be pregnant, see your health care professional as soon as possible. Tell your health care professional if you want to get pregnant within the next year. The effect of this medicine may last a long time after you get your last injection. What side effects may I notice from receiving this medicine? Side effects that you should report to your doctor or health care professional as soon as possible: -allergic reactions like skin rash, itching or hives, swelling of the face, lips, or tongue -breast tenderness or discharge -breathing problems -changes in vision -depression -feeling faint or lightheaded, falls -fever -pain in the abdomen, chest, groin, or leg -problems with balance, talking, walking -unusually weak or tired -yellowing of the eyes or skin Side effects that usually do not require medical attention (report to your doctor or health care professional if they continue or are bothersome): -acne -fluid retention and swelling -headache -irregular periods, spotting, or absent periods -temporary pain,  itching, or skin reaction at site where injected -weight gain This list may not describe all possible side effects. Call your doctor for medical advice about side effects. You may report side effects to FDA at 1-800-FDA-1088. Where should I keep my medicine? This does not apply. The injection will be given to you by a health care professional. NOTE: This sheet is a summary. It may not cover all possible information. If you have questions about this medicine, talk to your doctor, pharmacist, or health care provider.  2013, [employer]. (08/22/2008 6:37:56 PM)

## 2013-06-12 ENCOUNTER — Ambulatory Visit (INDEPENDENT_AMBULATORY_CARE_PROVIDER_SITE_OTHER): Payer: No Typology Code available for payment source | Admitting: General Practice

## 2013-06-12 ENCOUNTER — Encounter: Payer: Self-pay | Admitting: General Practice

## 2013-06-12 VITALS — BP 125/69 | HR 100 | Temp 97.4°F | Ht 67.0 in | Wt 162.5 lb

## 2013-06-12 DIAGNOSIS — T148XXA Other injury of unspecified body region, initial encounter: Secondary | ICD-10-CM

## 2013-06-12 DIAGNOSIS — M549 Dorsalgia, unspecified: Secondary | ICD-10-CM

## 2013-06-12 LAB — POCT URINALYSIS DIPSTICK
Bilirubin, UA: NEGATIVE
Blood, UA: NEGATIVE
Glucose, UA: NEGATIVE
Ketones, UA: NEGATIVE
Leukocytes, UA: NEGATIVE
Nitrite, UA: NEGATIVE
pH, UA: 6.5

## 2013-06-12 LAB — POCT UA - MICROSCOPIC ONLY
Casts, Ur, LPF, POC: NEGATIVE
Crystals, Ur, HPF, POC: NEGATIVE
Yeast, UA: NEGATIVE

## 2013-06-12 MED ORDER — IBUPROFEN 800 MG PO TABS: 800.0000 mg | ORAL_TABLET | Freq: Three times a day (TID) | ORAL | Status: DC | PRN

## 2013-06-12 MED ORDER — CYCLOBENZAPRINE HCL 5 MG PO TABS: 5.0000 mg | ORAL_TABLET | Freq: Two times a day (BID) | ORAL | Status: DC | PRN

## 2013-06-12 NOTE — Patient Instructions (Signed)
Muscle Strain  Muscle strain occurs when a muscle is stretched beyond its normal length. A small number of muscle fibers generally are torn. This is especially common in athletes. This happens when a sudden, violent force placed on a muscle stretches it too far. Usually, recovery from muscle strain takes 1 to 2 weeks. Complete healing will take 5 to 6 weeks.   HOME CARE INSTRUCTIONS    While awake, apply ice to the sore muscle for the first 2 days after the injury.   Put ice in a plastic bag.   Place a towel between your skin and the bag.   Leave the ice on for 15-20 minutes each hour.   Do not use the strained muscle for several days, until you no longer have pain.   You may wrap the injured area with an elastic bandage for comfort. Be careful not to wrap it too tightly. This may interfere with blood circulation or increase swelling.   Only take over-the-counter or prescription medicines for pain, discomfort, or fever as directed by your caregiver.  SEEK MEDICAL CARE IF:   You have increasing pain or swelling in the injured area.  MAKE SURE YOU:    Understand these instructions.   Will watch your condition.   Will get help right away if you are not doing well or get worse.  Document Released: 08/01/2005 Document Revised: 10/24/2011 Document Reviewed: 08/13/2011  [employer] Patient Information 2014 [employer].

## 2013-06-12 NOTE — Progress Notes (Signed)
Subjective:    Patient ID: Krystal Valdez, female    DOB: September 02, 1995, 17 y.o.   MRN: [id number]  Back Pain This is a new problem. The current episode started yesterday. The problem occurs 2 to 4 times per day. The problem is unchanged. The pain is present in the thoracic spine and lumbar spine. The quality of the pain is described as aching and stabbing. The pain is at a severity of 7/10. The pain is moderate. The pain is the same all the time. The symptoms are aggravated by bending, lying down and twisting. Associated symptoms include dysuria. Pertinent negatives include no bladder incontinence, bowel incontinence, chest pain, fever, headaches, leg pain, numbness, tingling or weakness. She has tried nothing for the symptoms.  reports last menstrual cycle was May 23, 2013 and was regular.     Review of Systems  Constitutional: Negative for fever and chills.  Respiratory: Negative for chest tightness and shortness of breath.   Cardiovascular: Negative for chest pain and palpitations.  Gastrointestinal: Negative for bowel incontinence.  Genitourinary: Positive for dysuria. Negative for bladder incontinence.  Musculoskeletal: Positive for back pain and myalgias.  Neurological: Negative for dizziness, tingling, weakness, numbness and headaches.       Objective:   Physical Exam  Constitutional: She is oriented to person, place, and time. She appears well-developed and well-nourished.  Cardiovascular: Normal rate, regular rhythm and normal heart sounds.   Pulmonary/Chest: Effort normal and breath sounds normal.  Abdominal: Soft. Bowel sounds are normal. She exhibits no distension. There is no tenderness.  Musculoskeletal: She exhibits edema and tenderness.  Left lumbar and thoracic area tenderness/edema  Neurological: She is alert and oriented to person, place, and time.  Skin: Skin is warm and dry.  Psychiatric: She has a normal mood and affect.     Results for orders placed in  visit on 06/12/13  POCT UA - MICROSCOPIC ONLY      Result Value Range   WBC, Ur, HPF, POC neg     RBC, urine, microscopic occ     Bacteria, U Microscopic mod     Mucus, UA mod     Epithelial cells, urine per micros few     Crystals, Ur, HPF, POC neg     Casts, Ur, LPF, POC neg     Yeast, UA neg    POCT URINALYSIS DIPSTICK      Result Value Range   Color, UA yellow     Clarity, UA cloudy     Glucose, UA neg     Bilirubin, UA neg     Ketones, UA neg     Spec Grav, UA <=1.005     Blood, UA neg     pH, UA 6.5     Protein, UA neg     Urobilinogen, UA negative     Nitrite, UA neg     Leukocytes, UA Negative         Assessment & Plan:  1. Back pain  - POCT UA - Microscopic Only - POCT urinalysis dipstick - Urine culture  2. Muscle strain  - ibuprofen (ADVIL,MOTRIN) 800 MG tablet; Take 1 tablet (800 mg total) by mouth every 8 (eight) hours as needed for pain.  Dispense: 20 tablet; Refill: 0 - cyclobenzaprine (FLEXERIL) 5 MG tablet; Take 1 tablet (5 mg total) by mouth 2 (two) times daily as needed for muscle spasms.  Dispense: 15 tablet; Refill: 0 -discussed sedation precautions -RTO if symptoms worsen or no improvement, may refer  for PT -information sheet provided and discussed, muscle strain -Patient verbalized understanding Coralie Keens, FNP-C

## 2013-06-14 LAB — URINE CULTURE: Organism ID, Bacteria: NO GROWTH

## 2013-06-20 ENCOUNTER — Other Ambulatory Visit: Payer: Self-pay

## 2013-07-26 ENCOUNTER — Encounter (HOSPITAL_COMMUNITY): Payer: Self-pay | Admitting: Emergency Medicine

## 2013-07-26 ENCOUNTER — Emergency Department (INDEPENDENT_AMBULATORY_CARE_PROVIDER_SITE_OTHER)
Admission: EM | Admit: 2013-07-26 | Discharge: 2013-07-26 | Disposition: A | Discharge status: Home / Self Care | Payer: No Typology Code available for payment source | Source: Home / Self Care | Attending: Family Medicine | Admitting: Family Medicine

## 2013-07-26 DIAGNOSIS — J329 Chronic sinusitis, unspecified: Secondary | ICD-10-CM

## 2013-07-26 MED ORDER — AMOXICILLIN 500 MG PO CAPS: 500.0000 mg | ORAL_CAPSULE | Freq: Three times a day (TID) | ORAL | Status: DC

## 2013-07-26 NOTE — ED Provider Notes (Signed)
Medical screening examination/treatment/procedure(s) were performed by non-physician practitioner and as supervising physician I was immediately available for consultation/collaboration.  Audelia Knape, M.D.  Antoin Dargis C Tessah Patchen, MD 07/26/13 2135 

## 2013-07-26 NOTE — ED Provider Notes (Signed)
CSN: [id number]     Arrival date & time 07/26/13  1851 History   First MD Initiated Contact with Patient 07/26/13 1924     No chief complaint on file.  (Consider location/radiation/quality/duration/timing/severity/associated sxs/prior Treatment) Patient is a 17 y.o. female presenting with cough. The history is provided by the patient. No language interpreter was used.  Cough Cough characteristics:  Non-productive Severity:  Moderate Onset quality:  Gradual Duration:  1 week Timing:  Constant Progression:  Worsening Chronicity:  New Relieved by:  Nothing Worsened by:  Nothing tried Associated symptoms: rhinorrhea     Past Medical History  Diagnosis Date  . Asthma   . UTI (lower urinary tract infection)   . Asthma   . Allergy   . Mastitis, left, acute 03/28/2013    rx augmentin.  . Left breast abscess 04/02/2013    Will change antibiotics to septra Ds and refer to Dr Lovell Sheehan 8/21 at 11 am   Past Surgical History  Procedure Laterality Date  . No past surgeries    . Myringotomy      bilateral, x 2  . Incision and drainage abscess Left 04/05/2013    Procedure: INCISION AND DRAINAGE LEFT BREAST ABSCESS;  Surgeon: Dalia Heading, MD;  Location: [facility];  Service: General;  Laterality: Left;   Family History  Problem Relation Age of Onset  . Hypertension Other   . Diabetes Other   . Cancer Other     hx breast,colon,cervical cancer  . Hypertension Father   . Hyperlipidemia Father   . Diabetes Paternal Grandfather    History  Substance Use Topics  . Smoking status: Current Every Day Smoker -- 1.00 packs/day for 4 years    Types: Cigarettes  . Smokeless tobacco: Never Used     Comment: 1/2 pack a day  . Alcohol Use: No   OB History   Grav Para Term Preterm Abortions TAB SAB Ect Mult Living   1 1 1       1      Review of Systems  HENT: Positive for rhinorrhea.   Respiratory: Positive for cough.   All other systems reviewed and are negative.    Allergies  Review  of patient's allergies indicates no known allergies.  Home Medications   Current Outpatient Rx  Name  Route  Sig  Dispense  Refill  . albuterol (PROVENTIL HFA;VENTOLIN HFA) 108 (90 BASE) MCG/ACT inhaler   Inhalation   Inhale 2 puffs into the lungs every 6 (six) hours as needed for wheezing.   1 Inhaler   2   . amoxicillin-clavulanate (AUGMENTIN) 875-125 MG per tablet   Oral   Take 1 tablet by mouth 2 (two) times daily.   28 tablet   0   . cyclobenzaprine (FLEXERIL) 5 MG tablet   Oral   Take 1 tablet (5 mg total) by mouth 2 (two) times daily as needed for muscle spasms.   15 tablet   0   . ibuprofen (ADVIL,MOTRIN) 800 MG tablet   Oral   Take 1 tablet (800 mg total) by mouth every 8 (eight) hours as needed for pain.   20 tablet   0    BP 133/64  Pulse 83  Temp(Src) 97.8 F (36.6 C) (Oral)  Resp 16  SpO2 100% Physical Exam  Nursing note and vitals reviewed. Constitutional: She is oriented to person, place, and time. She appears well-developed and well-nourished.  HENT:  Head: Normocephalic.  Right Ear: External ear normal.  Left Ear:  External ear normal.  Nose: Nose normal.  Mouth/Throat: Oropharynx is clear and moist.  Eyes: Conjunctivae and EOM are normal. Pupils are equal, round, and reactive to light.  Neck: Normal range of motion.  Pulmonary/Chest: Effort normal.  Musculoskeletal: Normal range of motion.  Neurological: She is alert and oriented to person, place, and time.  Skin: Skin is warm.  Psychiatric: She has a normal mood and affect.    ED Course  Procedures (including critical care time) Labs Review Labs Reviewed - No data to display Imaging Review No results found.  EKG Interpretation    Date/Time:    Ventricular Rate:    PR Interval:    QRS Duration:   QT Interval:    QTC Calculation:   R Axis:     Text Interpretation:              MDM   1. Sinusitis    amoxicillian    Elson Areas, [place] 07/26/13 1938

## 2013-07-26 NOTE — ED Notes (Signed)
Provider in before nurse.  Pt assessed and triaged by Provider.

## 2013-10-28 ENCOUNTER — Telehealth: Payer: Self-pay | Admitting: Nurse Practitioner

## 2013-10-28 NOTE — Telephone Encounter (Signed)
Patient is going to bring paper work and tubes for us to look at and make sure that we can do what lab work is needed here

## 2014-04-06 ENCOUNTER — Encounter (HOSPITAL_COMMUNITY): Payer: Self-pay | Admitting: Emergency Medicine

## 2014-04-06 ENCOUNTER — Emergency Department (HOSPITAL_COMMUNITY)
Admission: EM | Admit: 2014-04-06 | Discharge: 2014-04-07 | Disposition: A | Discharge status: Home / Self Care | Payer: No Typology Code available for payment source | Attending: Emergency Medicine | Admitting: Emergency Medicine

## 2014-04-06 DIAGNOSIS — O99330 Smoking (tobacco) complicating pregnancy, unspecified trimester: Secondary | ICD-10-CM | POA: Diagnosis not present

## 2014-04-06 DIAGNOSIS — Z792 Long term (current) use of antibiotics: Secondary | ICD-10-CM | POA: Diagnosis not present

## 2014-04-06 DIAGNOSIS — N760 Acute vaginitis: Secondary | ICD-10-CM | POA: Insufficient documentation

## 2014-04-06 DIAGNOSIS — O9989 Other specified diseases and conditions complicating pregnancy, childbirth and the puerperium: Secondary | ICD-10-CM | POA: Insufficient documentation

## 2014-04-06 DIAGNOSIS — J45909 Unspecified asthma, uncomplicated: Secondary | ICD-10-CM | POA: Diagnosis not present

## 2014-04-06 DIAGNOSIS — O2390 Unspecified genitourinary tract infection in pregnancy, unspecified trimester: Secondary | ICD-10-CM | POA: Insufficient documentation

## 2014-04-06 DIAGNOSIS — N390 Urinary tract infection, site not specified: Secondary | ICD-10-CM | POA: Insufficient documentation

## 2014-04-06 DIAGNOSIS — B9689 Other specified bacterial agents as the cause of diseases classified elsewhere: Secondary | ICD-10-CM

## 2014-04-06 LAB — WET PREP, GENITAL
Trich, Wet Prep: NONE SEEN
WBC, Wet Prep HPF POC: NONE SEEN
Yeast Wet Prep HPF POC: NONE SEEN

## 2014-04-06 LAB — URINALYSIS, ROUTINE W REFLEX MICROSCOPIC
BILIRUBIN URINE: NEGATIVE
Glucose, UA: NEGATIVE mg/dL
HGB URINE DIPSTICK: NEGATIVE
KETONES UR: NEGATIVE mg/dL
NITRITE: NEGATIVE
Protein, ur: NEGATIVE mg/dL
Specific Gravity, Urine: 1.014 (ref 1.005–1.030)
UROBILINOGEN UA: 0.2 mg/dL (ref 0.0–1.0)
pH: 7 (ref 5.0–8.0)

## 2014-04-06 LAB — POC URINE PREG, ED: Preg Test, Ur: POSITIVE — AB

## 2014-04-06 LAB — URINE MICROSCOPIC-ADD ON

## 2014-04-06 MED ORDER — METRONIDAZOLE 500 MG PO TABS: 500.0000 mg | ORAL_TABLET | Freq: Two times a day (BID) | ORAL | Status: DC

## 2014-04-06 MED ORDER — CEPHALEXIN 500 MG PO CAPS: 500.0000 mg | ORAL_CAPSULE | Freq: Four times a day (QID) | ORAL | Status: DC

## 2014-04-06 NOTE — ED Notes (Signed)
Pt reports taking a positive pregnancy test a month ago- for the past 4 days pt has noticed white vaginal discharge with itching.  Pt unsure how far along she is, denies pain with urination. Denies abdominal or pelvic pain.

## 2014-04-06 NOTE — ED Provider Notes (Signed)
CSN: [id number]     Arrival date & time 04/06/14  2116 History   First MD Initiated Contact with Patient 04/06/14 2131     Chief Complaint  Patient presents with  . Vaginal Discharge     (Consider location/radiation/quality/duration/timing/severity/associated sxs/prior Treatment) Patient is a 18 y.o. female presenting with female genitourinary complaint. The history is provided by the patient.  Female GU Problem This is a new problem. The current episode started in the past 7 days. The problem occurs constantly. The problem has been unchanged. Pertinent negatives include no arthralgias, chest pain, chills, congestion, fever, headaches, myalgias, nausea or vomiting. Nothing aggravates the symptoms. She has tried nothing for the symptoms. The treatment provided no relief.   18 yo F with a chief complaint of vaginal discharge and itching. As the going on for the past 4 or 5 days. Patient had a positive pregnancy test last month however has not sought OB care yet. Patient is a G2P1001. Denies any problems with pregnancy. Patient continues to smoke. Patient has started taking prenatal vitamins. Patient denies any vaginal bleeding any cramping patient denies any headaches blurry vision leg swelling.  Past Medical History  Diagnosis Date  . Asthma   . UTI (lower urinary tract infection)   . Asthma   . Allergy   . Mastitis, left, acute 03/28/2013    rx augmentin.  . Left breast abscess 04/02/2013    Will change antibiotics to septra Ds and refer to Dr Lovell Sheehan 8/21 at 11 am   Past Surgical History  Procedure Laterality Date  . No past surgeries    . Myringotomy      bilateral, x 2  . Incision and drainage abscess Left 04/05/2013    Procedure: INCISION AND DRAINAGE LEFT BREAST ABSCESS;  Surgeon: Dalia Heading, MD;  Location: [facility];  Service: General;  Laterality: Left;   Family History  Problem Relation Age of Onset  . Hypertension Other   . Diabetes Other   . Cancer Other     hx  breast,colon,cervical cancer  . Hypertension Father   . Hyperlipidemia Father   . Diabetes Paternal Grandfather    History  Substance Use Topics  . Smoking status: Current Every Day Smoker -- 1.00 packs/day for 4 years    Types: Cigarettes  . Smokeless tobacco: Never Used     Comment: 1/2 pack a day  . Alcohol Use: No   OB History   Grav Para Term Preterm Abortions TAB SAB Ect Mult Living   Review of Systems  Constitutional: Negative for fever and chills.  HENT: Negative for congestion and rhinorrhea.   Eyes: Negative for redness and visual disturbance.  Respiratory: Negative for shortness of breath and wheezing.   Cardiovascular: Negative for chest pain and palpitations.  Gastrointestinal: Negative for nausea and vomiting.  Genitourinary: Positive for vaginal discharge. Negative for dysuria, urgency, vaginal bleeding and vaginal pain.  Musculoskeletal: Negative for arthralgias and myalgias.  Skin: Negative for pallor and wound.  Neurological: Negative for dizziness and headaches.      Allergies  Review of patient's allergies indicates no known allergies.  Home Medications   Prior to Admission medications   Medication Sig Start Date End Date Taking? Authorizing Provider  acetaminophen (TYLENOL) 325 MG tablet Take 650 mg by mouth every 6 (six) hours as needed for headache.   Yes Historical Provider, MD  cephALEXin (KEFLEX) 500 MG capsule Take 1  capsule (500 mg total) by mouth 4 (four) times daily. 04/06/14   Melene Plan, MD  metroNIDAZOLE (FLAGYL) 500 MG tablet Take 1 tablet (500 mg total) by mouth 2 (two) times daily. 04/06/14   Melene Plan, MD   BP 131/56  Pulse 118  Temp(Src) 98.3 F (36.8 C) (Oral)  Resp 16  Ht  (1.702 m)  Wt 160 lb (72.576 kg)  BMI 25.05 kg/m2  SpO2 100%  LMP 12/15/2013 Physical Exam  Constitutional: She is oriented to person, place, and time. She appears well-developed and well-nourished. No distress.  HENT:  Head:  Normocephalic and atraumatic.  Eyes: EOM are normal. Pupils are equal, round, and reactive to light.  Neck: Normal range of motion. Neck supple.  Cardiovascular: Normal rate and regular rhythm.  Exam reveals no gallop and no friction rub.   No murmur heard. Pulmonary/Chest: Effort normal. She has no wheezes. She has no rales.  Abdominal: Soft. She exhibits no distension. There is no tenderness.  Genitourinary: Uterus normal.    Cervix exhibits discharge. Cervix exhibits no motion tenderness and no friability. Right adnexum displays no mass, no tenderness and no fullness. Left adnexum displays no mass, no tenderness and no fullness.  Musculoskeletal: She exhibits no edema and no tenderness.  Neurological: She is alert and oriented to person, place, and time.  Skin: Skin is warm and dry. She is not diaphoretic.  Psychiatric: She has a normal mood and affect. Her behavior is normal.    ED Course  Procedures (including critical care time) Labs Review Labs Reviewed  WET PREP, GENITAL - Abnormal; Notable for the following:    Clue Cells Wet Prep HPF POC MANY (*)    All other components within normal limits  URINALYSIS, ROUTINE W REFLEX MICROSCOPIC - Abnormal; Notable for the following:    APPearance TURBID (*)    Leukocytes, UA MODERATE (*)    All other components within normal limits  URINE MICROSCOPIC-ADD ON - Abnormal; Notable for the following:    Squamous Epithelial / LPF MANY (*)    Bacteria, UA MANY (*)    All other components within normal limits  POC URINE PREG, ED - Abnormal; Notable for the following:    Preg Test, Ur POSITIVE (*)    All other components within normal limits  GC/CHLAMYDIA PROBE AMP    Imaging Review No results found.   EKG Interpretation None      MDM   Final diagnoses:  BV (bacterial vaginosis)  UTI (lower urinary tract infection)    18 yo F with a chief complaint of vaginal discharge and itching. Patient appears to have folliculitis.  Patient also with vaginal discharge and have BV. Patient with positive nitrites positive leukocytes. We'll treat for BV and UTI. Patient will do warm compresses for the folliculitis. Discussed smoking cessation, and need for prenatal care.  11:15 PM:  I have discussed the diagnosis/risks/treatment options with the patient and believe the pt to be eligible for discharge home to follow-up with PCP. We also discussed returning to the ED immediately if new or worsening sx occur. We discussed the sx which are most concerning (e.g., vaginal bleeding, cramping) that necessitate immediate return. Medications administered to the patient during their visit and any new prescriptions provided to the patient are listed below.  Medications given during this visit Medications - No data to display  New Prescriptions   CEPHALEXIN (KEFLEX) 500 MG CAPSULE    Take 1 capsule (500 mg total) by mouth 4 (four)  times daily.   METRONIDAZOLE (FLAGYL) 500 MG TABLET    Take 1 tablet (500 mg total) by mouth 2 (two) times daily.       Melene Plan, MD 04/06/14 [phone]

## 2014-04-06 NOTE — ED Notes (Signed)
Patient states she has sores in her vaginal area for the past two-three days with vaginal discharge.

## 2014-04-06 NOTE — ED Notes (Signed)
Resident at bedside.  

## 2014-04-06 NOTE — Discharge Instructions (Signed)
Bacterial Vaginosis Bacterial vaginosis is an infection of the vagina. It happens when too many of certain germs (bacteria) grow in the vagina. HOME CARE  Take your medicine as told by your doctor.  Finish your medicine even if you start to feel better.  Do not have sex until you finish your medicine and are better.  Tell your sex partner that you have an infection. They should see their doctor for treatment.  Practice safe sex. Use condoms. Have only one sex partner. GET HELP IF:  You are not getting better after 3 days of treatment.  You have more grey fluid (discharge) coming from your vagina than before.  You have more pain than before.  You have a fever. MAKE SURE YOU:   Understand these instructions.  Will watch your condition.  Will get help right away if you are not doing well or get worse. Document Released: 05/10/2008 Document Revised: 05/22/2013 Document Reviewed: 03/13/2013 [employer] Patient Information 2015 [employer]. This information is not intended to replace advice given to you by your health care provider. Make sure you discuss any questions you have with your health care provider.  

## 2014-04-06 NOTE — ED Provider Notes (Signed)
This patient was seen in conjunction with the resident physician. The documentation accurately reflects the patient's emergency department evaluation. On my exam, patient was in no distress, stable vital signs.  We discussed all findings, and she was discharged in stable condition.  Gerhard Munch, MD 04/07/14 [phone]

## 2014-04-07 NOTE — ED Notes (Signed)
Patient discharged with all personal belongings. 

## 2014-04-08 LAB — GC/CHLAMYDIA PROBE AMP
CT Probe RNA: NEGATIVE
GC Probe RNA: NEGATIVE

## 2014-05-11 ENCOUNTER — Encounter: Payer: Self-pay | Admitting: Emergency Medicine

## 2014-05-11 ENCOUNTER — Emergency Department (INDEPENDENT_AMBULATORY_CARE_PROVIDER_SITE_OTHER)
Admission: EM | Admit: 2014-05-11 | Discharge: 2014-05-11 | Disposition: A | Discharge status: Home / Self Care | Payer: No Typology Code available for payment source | Source: Home / Self Care | Attending: Emergency Medicine | Admitting: Emergency Medicine

## 2014-05-11 DIAGNOSIS — L03319 Cellulitis of trunk, unspecified: Secondary | ICD-10-CM

## 2014-05-11 DIAGNOSIS — J069 Acute upper respiratory infection, unspecified: Secondary | ICD-10-CM

## 2014-05-11 DIAGNOSIS — L02219 Cutaneous abscess of trunk, unspecified: Secondary | ICD-10-CM

## 2014-05-11 DIAGNOSIS — L03111 Cellulitis of right axilla: Secondary | ICD-10-CM

## 2014-05-11 DIAGNOSIS — IMO0002 Reserved for concepts with insufficient information to code with codable children: Secondary | ICD-10-CM

## 2014-05-11 MED ORDER — CEFDINIR 300 MG PO CAPS: 300.0000 mg | ORAL_CAPSULE | Freq: Two times a day (BID) | ORAL | Status: DC

## 2014-05-11 MED ORDER — CEFTRIAXONE SODIUM 1 G IJ SOLR
1.0000 g | INTRAMUSCULAR | Status: AC
  Administered 2014-05-11: 1 g via INTRAMUSCULAR

## 2014-05-11 NOTE — ED Notes (Signed)
Pt has had red raised painful bumps on her stomach and now R armpit for over a month.  Bumps are red raised hard and painful.  10/10 pain very tender.  Pt is [redacted] weeks pregnant.

## 2014-05-11 NOTE — ED Provider Notes (Signed)
CSN: [id number]     Arrival date & time 05/11/14  1119 History   First MD Initiated Contact with Patient 05/11/14 1142     Chief Complaint  Patient presents with  . Skin Problem    stomach armpit    HPI 18 year old female. Here with other family members. Patient is [redacted] weeks pregnant.  She complains of several week of worsening painful red bumps. Much worse the past few days. One in right axilla, and several on lower abdomen. Denies blisters or drainage or bleeding. I note in her past medical history, she had mastitis in the left breast abscess last year, 03/2013, requiring incision and drainage was left breast abscess by Dr. Franky Macho, surgeon.  Currently he denies fever, chills, nausea, vomiting, vaginal bleeding or abdominal pain.  She smokes cigarettes. She also complains of URI symptoms for several days. No chills/sweats No  Fever  +  Nasal congestion +  Discolored Post-nasal drainage Mild sinus pain/pressure No sore throat  +  Nonproductive cough No wheezing Positive chest congestion No hemoptysis No shortness of breath No pleuritic pain  No itchy/red eyes No earache  No nausea No vomiting No abdominal pain No diarrhea  +  Fatigue No myalgias No headache    Past Medical History  Diagnosis Date  . Asthma   . UTI (lower urinary tract infection)   . Asthma   . Allergy   . Mastitis, left, acute 03/28/2013    rx augmentin.  . Left breast abscess 04/02/2013    Will change antibiotics to septra Ds and refer to Dr Lovell Sheehan 8/21 at 11 am   Past Surgical History  Procedure Laterality Date  . No past surgeries    . Myringotomy      bilateral, x 2  . Incision and drainage abscess Left 04/05/2013    Procedure: INCISION AND DRAINAGE LEFT BREAST ABSCESS;  Surgeon: Dalia Heading, MD;  Location: [facility];  Service: General;  Laterality: Left;   Family History  Problem Relation Age of Onset  . Hypertension Other   . Diabetes Other   . Cancer Other     hx  breast,colon,cervical cancer  . Hypertension Father   . Hyperlipidemia Father   . Diabetes Paternal Grandfather    History  Substance Use Topics  . Smoking status: Current Every Day Smoker -- 1.00 packs/day for 4 years    Types: Cigarettes  . Smokeless tobacco: Never Used     Comment: 1/2 pack a day  . Alcohol Use: No   OB History   Grav Para Term Preterm Abortions TAB SAB Ect Mult Living   Review of Systems  All other systems reviewed and are negative.   Allergies  Review of patient's allergies indicates no known allergies.  Home Medications   Prior to Admission medications   Medication Sig Start Date End Date Taking? Authorizing Provider  Prenatal Vit-Fe Fumarate-FA (PRENATAL MULTIVITAMIN) TABS tablet Take 1 tablet by mouth daily at 12 noon.   Yes Historical Provider, MD  acetaminophen (TYLENOL) 325 MG tablet Take 650 mg by mouth every 6 (six) hours as needed for headache.    Historical Provider, MD  cefdinir (OMNICEF) 300 MG capsule Take 1 capsule (300 mg total) by mouth 2 (two) times daily. X 10 days 05/11/14   Lajean Manes, MD   BP 126/75  Pulse 116  Temp(Src) 97.9 F (36.6 C) (Oral)  Ht  (1.702  m)  Wt 171 lb (77.565 kg)  BMI 26.78 kg/m2  SpO2 98%  LMP 12/15/2013 Physical Exam  Nursing note and vitals reviewed. Constitutional: She is oriented to person, place, and time. She appears well-developed and well-nourished. No distress.  HENT:  Head: Normocephalic and atraumatic.  Right Ear: Tympanic membrane normal.  Left Ear: Tympanic membrane normal.  Mouth/Throat: Oropharynx is clear and moist. No oropharyngeal exudate.  Nose: Boggy terminates, or mucoid drainage   Eyes: Conjunctivae and EOM are normal. Pupils are equal, round, and reactive to light. Right eye exhibits no discharge. Left eye exhibits no discharge. No scleral icterus.  Neck: Neck supple.  Cardiovascular: Normal rate, regular rhythm and normal heart sounds.   Pulmonary/Chest:  No respiratory distress. She has no wheezes. She has rhonchi. She has no rales.  A few anterior rhonchi. Good air movement bilaterally. No wheezes  Lymphadenopathy:    She has no cervical adenopathy.  Neurological: She is alert and oriented to person, place, and time.  Skin: Skin is warm and dry.     There are several firm, 1 x 1 cm indurated, warm, red raised, very tender lesions.   There is one in right axilla and several in skin of lower abdomen. No fluctuance or drainage or bleeding or blisters. No red streaks   Psychiatric: She has a normal mood and affect.    ED Course  Procedures (including critical care time) Labs Review Labs Reviewed - No data to display  Imaging Review No results found.   MDM   1. Cellulitis of right axilla   2. Cellulitis and abscess of trunk   3. Acute upper respiratory infections of unspecified site    Treatment options discussed, as well as risks, benefits, alternatives. Patient requested a shot of an antibiotic to jump start her treatment. I reviewed antibiotic choices, and antibiotics selected are approved in pregnancy. Patient voiced understanding and agreement with the following plans: Rocephin 1 g IM stat Cefdinir 300 twice a day x10 days Follow-up with your primary care doctor or surgeon in 2-3 days if not improving, or sooner if symptoms become worse. Precautions discussed. Red flags discussed. Advised her to quit smoking and explained risks of not doing so. Questions invited and answered. Patient voiced understanding and agreement.    Lajean Manes, MD 05/11/14 1321

## 2014-06-16 ENCOUNTER — Encounter: Payer: Self-pay | Admitting: Emergency Medicine

## 2014-10-09 ENCOUNTER — Emergency Department (HOSPITAL_COMMUNITY)
Admission: EM | Admit: 2014-10-09 | Discharge: 2014-10-09 | Disposition: A | Discharge status: Home / Self Care | Payer: No Typology Code available for payment source | Attending: Emergency Medicine | Admitting: Emergency Medicine

## 2014-10-09 ENCOUNTER — Encounter (HOSPITAL_COMMUNITY): Payer: Self-pay | Admitting: Emergency Medicine

## 2014-10-09 DIAGNOSIS — O99513 Diseases of the respiratory system complicating pregnancy, third trimester: Secondary | ICD-10-CM | POA: Insufficient documentation

## 2014-10-09 DIAGNOSIS — Z8742 Personal history of other diseases of the female genital tract: Secondary | ICD-10-CM | POA: Insufficient documentation

## 2014-10-09 DIAGNOSIS — O9A213 Injury, poisoning and certain other consequences of external causes complicating pregnancy, third trimester: Secondary | ICD-10-CM | POA: Diagnosis not present

## 2014-10-09 DIAGNOSIS — O99333 Smoking (tobacco) complicating pregnancy, third trimester: Secondary | ICD-10-CM | POA: Insufficient documentation

## 2014-10-09 DIAGNOSIS — Z3490 Encounter for supervision of normal pregnancy, unspecified, unspecified trimester: Secondary | ICD-10-CM

## 2014-10-09 DIAGNOSIS — J45909 Unspecified asthma, uncomplicated: Secondary | ICD-10-CM | POA: Insufficient documentation

## 2014-10-09 DIAGNOSIS — Z3A40 40 weeks gestation of pregnancy: Secondary | ICD-10-CM | POA: Diagnosis not present

## 2014-10-09 DIAGNOSIS — Z87440 Personal history of urinary (tract) infections: Secondary | ICD-10-CM | POA: Insufficient documentation

## 2014-10-09 DIAGNOSIS — Z043 Encounter for examination and observation following other accident: Secondary | ICD-10-CM | POA: Insufficient documentation

## 2014-10-09 DIAGNOSIS — Z79899 Other long term (current) drug therapy: Secondary | ICD-10-CM | POA: Insufficient documentation

## 2014-10-09 DIAGNOSIS — F17210 Nicotine dependence, cigarettes, uncomplicated: Secondary | ICD-10-CM | POA: Insufficient documentation

## 2014-10-09 MED ORDER — ACETAMINOPHEN 325 MG PO TABS
650.0000 mg | ORAL_TABLET | Freq: Once | ORAL | Status: AC
  Administered 2014-10-09: 650 mg via ORAL
  Filled 2014-10-09: qty 2

## 2014-10-09 NOTE — ED Notes (Signed)
Pt placed on toco and fetal hr monitor
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
## 2014-10-09 NOTE — Progress Notes (Signed)
D/C home with instructions. [facility]

## 2015-11-10 ENCOUNTER — Emergency Department
Admission: EM | Admit: 2015-11-10 | Discharge: 2015-11-10 | Disposition: A | Discharge status: Home / Self Care | Payer: PRIVATE HEALTH INSURANCE | Source: Home / Self Care | Attending: Family Medicine | Admitting: Family Medicine

## 2015-11-10 ENCOUNTER — Encounter: Payer: Self-pay | Admitting: *Deleted

## 2015-11-10 DIAGNOSIS — J069 Acute upper respiratory infection, unspecified: Secondary | ICD-10-CM

## 2015-11-10 DIAGNOSIS — J9801 Acute bronchospasm: Secondary | ICD-10-CM | POA: Diagnosis not present

## 2015-11-10 DIAGNOSIS — B9789 Other viral agents as the cause of diseases classified elsewhere: Principal | ICD-10-CM

## 2015-11-10 MED ORDER — AZITHROMYCIN 250 MG PO TABS: ORAL_TABLET | ORAL | Status: DC

## 2015-11-10 MED ORDER — BENZONATATE 200 MG PO CAPS: 200.0000 mg | ORAL_CAPSULE | Freq: Every day | ORAL | Status: DC

## 2015-11-10 MED ORDER — PREDNISONE 20 MG PO TABS: ORAL_TABLET | ORAL | Status: DC

## 2015-11-10 MED ORDER — ALBUTEROL SULFATE HFA 108 (90 BASE) MCG/ACT IN AERS: 2.0000 | INHALATION_SPRAY | RESPIRATORY_TRACT | Status: DC | PRN

## 2015-11-10 NOTE — ED Provider Notes (Signed)
CSN: [id number]     Arrival date & time 11/10/15  1236 History   First MD Initiated Contact with Patient 11/10/15 1317     Chief Complaint  Patient presents with  . Cough      HPI Comments: Patient complains of two day history of typical cold-like symptoms including sinus congestion, headache, fatigue, chills, and non-productive cough.  She has developed a right earache.  She has developed wheezing and shortness of breath with activity. She has a past history of exercise asthma and seasonal allergies. She also has a family history of asthma. She has been under increased stress recently after the death of her young daughter.  The history is provided by the patient.    Past Medical History  Diagnosis Date  . Asthma   . UTI (lower urinary tract infection)   . Asthma   . Allergy   . Mastitis, left, acute 03/28/2013    rx augmentin.  . Left breast abscess 04/02/2013    Will change antibiotics to septra Ds and refer to Dr Lovell Sheehan 8/21 at 11 am   Past Surgical History  Procedure Laterality Date  . No past surgeries    . Myringotomy      bilateral, x 2  . Incision and drainage abscess Left 04/05/2013    Procedure: INCISION AND DRAINAGE LEFT BREAST ABSCESS;  Surgeon: Dalia Heading, MD;  Location: [facility];  Service: General;  Laterality: Left;   Family History  Problem Relation Age of Onset  . Hypertension Other   . Diabetes Other   . Cancer Other     hx breast,colon,cervical cancer  . Hypertension Father   . Hyperlipidemia Father   . Diabetes Paternal Grandfather    Social History  Substance Use Topics  . Smoking status: Current Every Day Smoker -- 1.00 packs/day for 4 years    Types: Cigarettes  . Smokeless tobacco: Never Used     Comment: 1/2 pack a day  . Alcohol Use: No   OB History    Gravida Para Term Preterm AB TAB SAB Ectopic Multiple Living   Review of Systems No sore throat + cough No pleuritic pain + wheezing + nasal congestion +  post-nasal drainage No sinus pain/pressure No itchy/red eyes No earache No hemoptysis + SOB No fever, + chills No nausea No vomiting No abdominal pain No diarrhea No urinary symptoms No skin rash + fatigue No myalgias + headache Used OTC meds without relief  Allergies  Review of patient's allergies indicates no known allergies.  Home Medications   Prior to Admission medications   Medication Sig Start Date End Date Taking? Authorizing Provider  albuterol (PROVENTIL HFA;VENTOLIN HFA) 108 (90 Base) MCG/ACT inhaler Inhale 2 puffs into the lungs every 4 (four) hours as needed for wheezing or shortness of breath. 11/10/15   Lattie Haw, MD  azithromycin (ZITHROMAX Z-PAK) 250 MG tablet Take 2 tabs today; then begin one tab once daily for 4 more days. 11/10/15   Lattie Haw, MD  benzonatate (TESSALON) 200 MG capsule Take 1 capsule (200 mg total) by mouth at bedtime. Take as needed for cough 11/10/15   Lattie Haw, MD  predniSONE (DELTASONE) 20 MG tablet Take one tab by mouth twice daily for 5 days, then one daily for 3 days. Take with food. 11/10/15   Lattie Haw, MD   Meds Ordered and Administered this Visit  Medications -  No data to display  BP 113/62 mmHg  Pulse 94  Temp(Src) 97.6 F (36.4 C) (Oral)  Resp 16  Ht 5\' 7"  (1.702 m)  Wt 142 lb (64.411 kg)  BMI 22.24 kg/m2  SpO2 100%  LMP 10/24/2015  Breastfeeding? Unknown No data found.   Physical Exam Nursing notes and Vital Signs reviewed. Appearance:  Patient appears stated age, and in no acute distress Eyes:  Pupils are equal, round, and reactive to light and accomodation.  Extraocular movement is intact.  Conjunctivae are not inflamed  Ears:  Canals normal.  Tympanic membranes normal.  Nose:  Mildly congested turbinates.  No sinus tenderness.   Pharynx:  Normal Neck:  Supple.  Tender enlarged posterior/lateral nodes are palpated bilaterally  Lungs:  Clear to auscultation.  Breath sounds are equal.  Moving  air well. Heart:  Regular rate and rhythm without murmurs, rubs, or gallops.  Abdomen:  Nontender without masses or hepatosplenomegaly.  Bowel sounds are present.  No CVA or flank tenderness.  Extremities:  No edema.  Skin:  No rash present.   ED Course  Procedures none    MDM   1. Viral URI with cough   2. Bronchospasm    Begin prednisone burst and taper.  Prescription written for Benzonatate [facility](Tessalon) to take at bedtime for night-time cough.  Begin Z-pak for atypical coverage.  Rx for albuterol inhaler. Take plain guaifenesin (1200mg  extended release tabs such as Mucinex) twice daily, with plenty of water, for cough and congestion.  May add Pseudoephedrine (30mg , one or two every 4 to 6 hours) for sinus congestion.  Get adequate rest.   May use Afrin nasal spray (or generic oxymetazoline) twice daily for about 5 days and then discontinue.  Also recommend using saline nasal spray several times daily and saline nasal irrigation (AYR is a common brand).  Use Flonase nasal spray each morning after using Afrin nasal spray and saline nasal irrigation. Try warm salt water gargles for sore throat.  Stop all antihistamines for now, and other non-prescription cough/cold preparations.  Follow-up with family doctor if not improving about10 days     Lattie HawStephen A Ezequias Lard, MD 11/10/15 1501

## 2015-11-10 NOTE — ED Notes (Signed)
Pt c/o cough x 2 days, productive at times. Denies fever.

## 2015-11-10 NOTE — Discharge Instructions (Signed)
Take plain guaifenesin (1200mg extended release tabs such as Mucinex) twice daily, with plenty of water, for cough and congestion.  May add Pseudoephedrine (30mg, one or two every 4 to 6 hours) for sinus congestion.  Get adequate rest.   °May use Afrin nasal spray (or generic oxymetazoline) twice daily for about 5 days and then discontinue.  Also recommend using saline nasal spray several times daily and saline nasal irrigation (AYR is a common brand).  Use Flonase nasal spray each morning after using Afrin nasal spray and saline nasal irrigation. °Try warm salt water gargles for sore throat.  °Stop all antihistamines for now, and other non-prescription cough/cold preparations. °Follow-up with family doctor if not improving about10 days.  °

## 2016-03-02 ENCOUNTER — Encounter: Payer: Self-pay | Admitting: Emergency Medicine

## 2016-03-02 ENCOUNTER — Emergency Department (INDEPENDENT_AMBULATORY_CARE_PROVIDER_SITE_OTHER)
Admission: EM | Admit: 2016-03-02 | Discharge: 2016-03-02 | Disposition: A | Discharge status: Home / Self Care | Payer: No Typology Code available for payment source | Source: Home / Self Care | Attending: Family Medicine | Admitting: Family Medicine

## 2016-03-02 DIAGNOSIS — H5712 Ocular pain, left eye: Secondary | ICD-10-CM | POA: Diagnosis not present

## 2016-03-02 DIAGNOSIS — N390 Urinary tract infection, site not specified: Secondary | ICD-10-CM

## 2016-03-02 DIAGNOSIS — H109 Unspecified conjunctivitis: Secondary | ICD-10-CM | POA: Diagnosis not present

## 2016-03-02 LAB — POCT URINALYSIS DIP (MANUAL ENTRY)
Bilirubin, UA: NEGATIVE
Blood, UA: NEGATIVE
Glucose, UA: NEGATIVE
Ketones, POC UA: NEGATIVE
Leukocytes, UA: NEGATIVE
Nitrite, UA: POSITIVE — AB
Protein Ur, POC: NEGATIVE
Spec Grav, UA: 1.02 (ref 1.005–1.03)
Urobilinogen, UA: 0.2 (ref 0–1)
pH, UA: 6 (ref 5–8)

## 2016-03-02 MED ORDER — POLYMYXIN B-TRIMETHOPRIM 10000-0.1 UNIT/ML-% OP SOLN: 1.0000 [drp] | OPHTHALMIC | Status: DC

## 2016-03-02 MED ORDER — CEPHALEXIN 500 MG PO CAPS: 500.0000 mg | ORAL_CAPSULE | Freq: Two times a day (BID) | ORAL | Status: DC

## 2016-03-02 MED ORDER — OLOPATADINE HCL 0.2 % OP SOLN
1.0000 [drp] | Freq: Every day | OPHTHALMIC | Status: DC
Start: 2016-03-02 — End: 2016-05-12

## 2016-03-02 NOTE — ED Notes (Signed)
Left eye itching, swollen, matted this morning Dysuria x 1 week, cloudy, foul odor

## 2016-03-02 NOTE — ED Provider Notes (Signed)
CSN: [id number]     Arrival date & time 03/02/16  1215 History   First MD Initiated Contact with Patient 03/02/16 1240     Chief Complaint  Patient presents with  . Eye Problem    Left eye itching, swollen, matted this morning  . Dysuria    Dysuria x 1 week, foul odor, cloudy   (Consider location/radiation/quality/duration/timing/severity/associated sxs/prior Treatment) HPI  Krystal Valdez is a 20 y.o. female presenting to UC with c/o Left eye swelling, redness, itching and discharge that started early this morning.  She has been using OTC Visine and rinsed her eye as well as took benadryl but states it is still sore and swollen.  Does not recall any eye injury.  Denies recent URI symptoms. She does not wear contacts or glasses but notes she is suppose to wear glasses.  Denies fever or chills.  Pt also c/o dysuria for about 1 week with cloudy malodorous urine.  Hx of recurrent UTIs. Pt notes she has an 49mo old son and UTIs throughout her pregnancy.  Mild pain with urination. She has not tried anything for her symptoms. Denies fever, nausea or vomiting. No vaginal symptoms.    Past Medical History  Diagnosis Date  . Asthma   . UTI (lower urinary tract infection)   . Asthma   . Allergy   . Mastitis, left, acute 03/28/2013    rx augmentin.  . Left breast abscess 04/02/2013    Will change antibiotics to septra Ds and refer to Dr Lovell SheehanJenkins 8/21 at 11 am   Past Surgical History  Procedure Laterality Date  . No past surgeries    . Myringotomy      bilateral, x 2  . Incision and drainage abscess Left 04/05/2013    Procedure: INCISION AND DRAINAGE LEFT BREAST ABSCESS;  Surgeon: Dalia HeadingMark A Jenkins, MD;  Location: [facility];  Service: General;  Laterality: Left;   Family History  Problem Relation Age of Onset  . Hypertension Other   . Diabetes Other   . Cancer Other     hx breast,colon,cervical cancer  . Hypertension Father   . Hyperlipidemia Father   . Diabetes Paternal Grandfather     Social History  Substance Use Topics  . Smoking status: Current Every Day Smoker -- 1.00 packs/day for 4 years    Types: Cigarettes  . Smokeless tobacco: Never Used     Comment: 1/2 pack a day  . Alcohol Use: No   OB History    Gravida Para Term Preterm AB TAB SAB Ectopic Multiple Living   2 1 1       1      Review of Systems  Constitutional: Negative for fever and chills.  HENT: Negative for congestion, ear pain, sore throat, trouble swallowing and voice change.   Eyes: Positive for pain, discharge, redness, itching and visual disturbance. Negative for photophobia.       Left  Respiratory: Negative for cough and shortness of breath.   Cardiovascular: Negative for chest pain and palpitations.  Gastrointestinal: Negative for nausea, vomiting, abdominal pain and diarrhea.  Genitourinary: Positive for dysuria and urgency. Negative for frequency, hematuria, flank pain, decreased urine volume, vaginal bleeding, vaginal discharge, vaginal pain and pelvic pain.  Musculoskeletal: Negative for myalgias, back pain and arthralgias.  Skin: Negative for rash.  Neurological: Negative for dizziness, light-headedness and headaches.    Allergies  Review of patient's allergies indicates no known allergies.  Home Medications   Prior to Admission medications  Medication Sig Start Date End Date Taking? Authorizing Provider  albuterol (PROVENTIL HFA;VENTOLIN HFA) 108 (90 Base) MCG/ACT inhaler Inhale 2 puffs into the lungs every 4 (four) hours as needed for wheezing or shortness of breath. 11/10/15   Lattie Haw, MD  azithromycin (ZITHROMAX Z-PAK) 250 MG tablet Take 2 tabs today; then begin one tab once daily for 4 more days. 11/10/15   Lattie Haw, MD  benzonatate (TESSALON) 200 MG capsule Take 1 capsule (200 mg total) by mouth at bedtime. Take as needed for cough 11/10/15   Lattie Haw, MD  cephALEXin (KEFLEX) 500 MG capsule Take 1 capsule (500 mg total) by mouth 2 (two) times daily. For  7 days 03/02/16   Junius Finner, PA-C  Olopatadine HCl (PATADAY) 0.2 % SOLN Apply 1 drop to eye daily. For eye swelling and redness 03/02/16   Junius Finner, PA-C  predniSONE (DELTASONE) 20 MG tablet Take one tab by mouth twice daily for 5 days, then one daily for 3 days. Take with food. 11/10/15   Lattie Haw, MD  trimethoprim-polymyxin b (POLYTRIM) ophthalmic solution Place 1 drop into the right eye every 4 (four) hours. For 5 days 03/02/16   Junius Finner, PA-C   Meds Ordered and Administered this Visit  Medications - No data to display  BP 118/70 mmHg  Pulse 77  Temp(Src) 97.6 F (36.4 C) (Oral)  Ht 5\' 7"  (1.702 m)  Wt 142 lb (64.411 kg)  BMI 22.24 kg/m2  SpO2 100% No data found.   Physical Exam  Constitutional: She appears well-developed and well-nourished. No distress.  HENT:  Head: Normocephalic and atraumatic.  Right Ear: Tympanic membrane normal.  Left Ear: Tympanic membrane normal.  Nose: Nose normal.  Mouth/Throat: Uvula is midline, oropharynx is clear and moist and mucous membranes are normal.  Eyes: EOM are normal. Pupils are equal, round, and reactive to light. Right eye exhibits no discharge. Left eye exhibits no discharge. Left conjunctiva is injected. No scleral icterus.  Left eye: mild edema under eye, mild tenderness w/o erythema or warmth. No visible discharge.  No fluorescin uptake.   Neck: Normal range of motion. Neck supple.  Cardiovascular: Normal rate, regular rhythm and normal heart sounds.   Pulmonary/Chest: Effort normal. No respiratory distress.  Abdominal: Soft. She exhibits no distension. There is no tenderness.  Musculoskeletal: Normal range of motion.  Neurological: She is alert.  Skin: Skin is warm and dry. She is not diaphoretic.  Nursing note and vitals reviewed.   ED Course  Procedures (including critical care time)  Labs Review Labs Reviewed  POCT URINALYSIS DIP (MANUAL ENTRY) - Abnormal; Notable for the following:    Clarity, UA  cloudy (*)    Nitrite, UA Positive (*)    All other components within normal limits  URINE CULTURE    Imaging Review No results found.   Visual Acuity Review  Right Eye Distance: 20/20 Left Eye Distance: 20/100 Bilateral Distance: 20/20 (without correction)   MDM   1. Left conjunctivitis   2. Left eye pain   3. UTI (lower urinary tract infection)    Pt c/o Left eye irritation, redness, and swelling.  Due to reports of eye being matted shut and pain, will cover for bacterial cause.  Rx: polytrim and pataday  Pt also c/o urinary symptoms. UA: c/w UTI, will send culture. Rx: Keflex (has been on in past with good results)   F/u with PCP in 4-5 days if not improving, sooner if worsening. Patient  verbalized understanding and agreement with treatment plan.    Junius Finner, PA-C 03/02/16 1256

## 2016-03-04 ENCOUNTER — Telehealth: Payer: Self-pay

## 2016-03-04 NOTE — Telephone Encounter (Signed)
Left message for patient to call if questions or problems.

## 2016-03-05 LAB — URINE CULTURE: Colony Count: >=100000

## 2016-03-05 NOTE — Telephone Encounter (Signed)
Pt informed of urine culture results.  TMartin,CMA 

## 2016-04-22 ENCOUNTER — Encounter: Payer: Self-pay | Admitting: Emergency Medicine

## 2016-04-22 ENCOUNTER — Emergency Department (INDEPENDENT_AMBULATORY_CARE_PROVIDER_SITE_OTHER)
Admission: EM | Admit: 2016-04-22 | Discharge: 2016-04-22 | Disposition: A | Discharge status: Home / Self Care | Payer: No Typology Code available for payment source | Source: Home / Self Care | Attending: Family Medicine | Admitting: Family Medicine

## 2016-04-22 DIAGNOSIS — J45901 Unspecified asthma with (acute) exacerbation: Secondary | ICD-10-CM

## 2016-04-22 DIAGNOSIS — J209 Acute bronchitis, unspecified: Secondary | ICD-10-CM

## 2016-04-22 DIAGNOSIS — N390 Urinary tract infection, site not specified: Secondary | ICD-10-CM

## 2016-04-22 LAB — POCT URINALYSIS DIP (MANUAL ENTRY)
BILIRUBIN UA: NEGATIVE
Glucose, UA: NEGATIVE
Ketones, POC UA: NEGATIVE
NITRITE UA: POSITIVE — AB
Protein Ur, POC: NEGATIVE
RBC UA: NEGATIVE
Spec Grav, UA: 1.02 (ref 1.005–1.03)
UROBILINOGEN UA: NEGATIVE (ref 0–1)
pH, UA: 5.5 (ref 5–8)

## 2016-04-22 MED ORDER — IPRATROPIUM-ALBUTEROL 0.5-2.5 (3) MG/3ML IN SOLN
3.0000 mL | Freq: Four times a day (QID) | RESPIRATORY_TRACT | Status: DC
  Administered 2016-04-22: 3 mL via RESPIRATORY_TRACT

## 2016-04-22 MED ORDER — DEXAMETHASONE SODIUM PHOSPHATE 10 MG/ML IJ SOLN
10.0000 mg | Freq: Once | INTRAMUSCULAR | Status: AC
Start: 2016-04-22 — End: 2016-04-22
  Administered 2016-04-22: 10 mg via INTRAMUSCULAR

## 2016-04-22 MED ORDER — PREDNISONE 20 MG PO TABS: ORAL_TABLET | ORAL | 0 refills | Status: DC

## 2016-04-22 MED ORDER — AMOXICILLIN-POT CLAVULANATE 875-125 MG PO TABS: 1.0000 | ORAL_TABLET | Freq: Two times a day (BID) | ORAL | 0 refills | Status: DC

## 2016-04-22 MED ORDER — ALBUTEROL SULFATE HFA 108 (90 BASE) MCG/ACT IN AERS: 1.0000 | INHALATION_SPRAY | Freq: Four times a day (QID) | RESPIRATORY_TRACT | 0 refills | Status: DC | PRN

## 2016-04-22 NOTE — ED Triage Notes (Signed)
Pt c/o cough and SOB x2 weeks. She also has a painful bump on right axillary x1 week.  C/o urinary urgency and odor x1 week. Pt states she feels overwhelmed and hopeless. After extensive conversation pt states she does not want to harm her or anyone else. Pt advised [facility] needs [facility] if feelings worsen and to f/u with pcp on Monday.

## 2016-04-22 NOTE — ED Provider Notes (Signed)
CSN: [id number]     Arrival date & time 04/22/16  1748 History   First MD Initiated Contact with Patient 04/22/16 1853     Chief Complaint  Patient presents with  . Cough  . Urinary Tract Infection   (Consider location/radiation/quality/duration/timing/severity/associated sxs/prior Treatment) HPI Krystal Valdez is a 20 y.o. female presenting to UC with hx of asthma c/o 2 weeks of worsening moderately productive cough with yellow-green sputum and SOB, as well as a tender sore under right arm, and urinary symptoms with urgency, dysuria, and odor for about 1 week.  She has been taking Mucinex, Sudafed, and tylenol w/o relief of symptoms. She does not have an inhaler at home to use.  Pt does not have a PCP and does not like coming to the doctor but states she needs to get over the cough. Pt's mother is here with URI symptoms as well and another family member was dx with strep throat the other day.   Per triage note, pt advised nurse she was feeling depressed, resources were given to pt in triage. Pt did not mention feels of depression during my exam.    Past Medical History:  Diagnosis Date  . Allergy   . Asthma   . Asthma   . Left breast abscess 04/02/2013   Will change antibiotics to septra Ds and refer to Dr Lovell SheehanJenkins 8/21 at 11 am  . Mastitis, left, acute 03/28/2013   rx augmentin.  Marland Kitchen. UTI (lower urinary tract infection)    Past Surgical History:  Procedure Laterality Date  . INCISION AND DRAINAGE ABSCESS Left 04/05/2013   Procedure: INCISION AND DRAINAGE LEFT BREAST ABSCESS;  Surgeon: Dalia HeadingMark A Jenkins, MD;  Location: [facility];  Service: General;  Laterality: Left;  . MYRINGOTOMY     bilateral, x 2  . NO PAST SURGERIES     Family History  Problem Relation Age of Onset  . Hypertension Other   . Diabetes Other   . Cancer Other     hx breast,colon,cervical cancer  . Hypertension Father   . Hyperlipidemia Father   . Diabetes Paternal Grandfather    Social History  Substance Use Topics   . Smoking status: Current Every Day Smoker    Packs/day: 1.00    Years: 4.00    Types: Cigarettes  . Smokeless tobacco: Never Used     Comment: 1/2 pack a day  . Alcohol use No   OB History    Gravida Para Term Preterm AB Living   2 1 1     1    SAB TAB Ectopic Multiple Live Births           1     Review of Systems  Constitutional: Positive for fatigue and fever ( subjective). Negative for chills.  HENT: Positive for congestion and sore throat. Negative for ear pain, trouble swallowing and voice change.   Respiratory: Positive for cough, shortness of breath and wheezing.   Cardiovascular: Negative for chest pain and palpitations.  Gastrointestinal: Negative for abdominal pain, diarrhea, nausea and vomiting.  Genitourinary: Positive for dysuria, frequency and urgency. Negative for flank pain and hematuria.  Musculoskeletal: Negative for arthralgias, back pain and myalgias.  Skin: Negative for rash.  Neurological: Negative for dizziness, light-headedness and headaches.    Allergies  Review of patient's allergies indicates no known allergies.  Home Medications   Prior to Admission medications   Medication Sig Start Date End Date Taking? Authorizing Provider  albuterol (PROVENTIL HFA;VENTOLIN HFA) 108 (90 Base)  MCG/ACT inhaler Inhale 2 puffs into the lungs every 4 (four) hours as needed for wheezing or shortness of breath. 11/10/15   Lattie Haw, MD  albuterol (PROVENTIL HFA;VENTOLIN HFA) 108 (90 Base) MCG/ACT inhaler Inhale 1-2 puffs into the lungs every 6 (six) hours as needed for wheezing or shortness of breath. 04/22/16   Junius Finner, PA-C  amoxicillin-clavulanate (AUGMENTIN) 875-125 MG tablet Take 1 tablet by mouth 2 (two) times daily. One po bid x 7 days 04/22/16   Junius Finner, PA-C  azithromycin (ZITHROMAX Z-PAK) 250 MG tablet Take 2 tabs today; then begin one tab once daily for 4 more days. 11/10/15   Lattie Haw, MD  benzonatate (TESSALON) 200 MG capsule Take 1  capsule (200 mg total) by mouth at bedtime. Take as needed for cough 11/10/15   Lattie Haw, MD  cephALEXin (KEFLEX) 500 MG capsule Take 1 capsule (500 mg total) by mouth 2 (two) times daily. For 7 days 03/02/16   Junius Finner, PA-C  Olopatadine HCl (PATADAY) 0.2 % SOLN Apply 1 drop to eye daily. For eye swelling and redness 03/02/16   Junius Finner, PA-C  predniSONE (DELTASONE) 20 MG tablet Take one tab by mouth twice daily for 5 days, then one daily for 3 days. Take with food. 11/10/15   Lattie Haw, MD  predniSONE (DELTASONE) 20 MG tablet 3 tabs po day one, then 2 po daily x 4 days 04/22/16   Junius Finner, PA-C  trimethoprim-polymyxin b (POLYTRIM) ophthalmic solution Place 1 drop into the right eye every 4 (four) hours. For 5 days 03/02/16   Junius Finner, PA-C   Meds Ordered and Administered this Visit   Medications  dexamethasone (DECADRON) injection 10 mg (10 mg Intramuscular Given 04/22/16 1912)    BP 115/64 (BP Location: Right Arm)   Pulse 83   Temp 97.4 F (36.3 C) (Oral)   Wt 147 lb (66.7 kg)   LMP 04/18/2016 (Approximate)   SpO2 100%   Breastfeeding? No   BMI 23.02 kg/m  No data found.   Physical Exam  Constitutional: She appears well-developed and well-nourished. No distress.  HENT:  Head: Normocephalic and atraumatic.  Right Ear: Tympanic membrane normal.  Left Ear: Tympanic membrane normal.  Nose: Mucosal edema present.  Mouth/Throat: Uvula is midline and mucous membranes are normal. Posterior oropharyngeal erythema present. No oropharyngeal exudate, posterior oropharyngeal edema or tonsillar abscesses.  Eyes: Conjunctivae are normal. No scleral icterus.  Neck: Normal range of motion. Neck supple.  Cardiovascular: Normal rate, regular rhythm and normal heart sounds.   Pulmonary/Chest: Effort normal. No respiratory distress. She has wheezes in the right middle field and the right lower field. She has rhonchi in the right middle field and the right lower field. She  has no rales. She exhibits no tenderness.  Wheeze and rhonchi in Right middle and lower lung fields. No respiratory distress. No accessory muscle use. Occasional productive cough during exam.  Abdominal: Soft. She exhibits no distension. There is no tenderness. There is no CVA tenderness.  Musculoskeletal: Normal range of motion.  Neurological: She is alert.  Skin: Skin is warm and dry. She is not diaphoretic. No erythema.  Nursing note and vitals reviewed.   Urgent Care Course   Clinical Course    Procedures (including critical care time)  Labs Review Labs Reviewed  POCT URINALYSIS DIP (MANUAL ENTRY) - Abnormal; Notable for the following:       Result Value   Clarity, UA cloudy (*)  Nitrite, UA Positive (*)    Leukocytes, UA Trace (*)    All other components within normal limits  URINE CULTURE    Imaging Review No results found.    MDM   1. Acute bronchitis, unspecified organism   2. Asthma exacerbation   3. UTI (lower urinary tract infection)    Pt c/o worsening URI symptoms and urinary symptoms for 2 weeks.  Sick contacts at home. Hx of asthma.  Wheeze and Rhonchi in Right lower lung field, likely pneumonia.  Tx in UC: Decadron 10mg  IM and duoneb. Wheezes did resolve, rhonchi still noted in Right lower lung field.  UA: c/w UTI   Rx: Augmentin, prednisone, and albuterol  Pt states tessalon does not help her.  Encouraged to take OTC cough medication. Advised pt once new prescriptions start working, cough should start to improve. F/u with PCP in 1 week if not improving, sooner if worsening. Patient verbalized understanding and agreement with treatment plan.    Junius Finner, PA-C 04/23/16 [phone]

## 2016-04-22 NOTE — Discharge Instructions (Signed)
°Emergency Department Resource Guide °1) Find a Doctor and Pay Out of Pocket °Although you won't have to find out who is covered by your insurance plan, it is a good idea to ask around and get recommendations. You will then need to call the office and see if the doctor you have chosen will accept you as a new patient and what types of options they offer for patients who are self-pay. Some doctors offer discounts or will set up payment plans for their patients who do not have insurance, but you will need to ask so you aren't surprised when you get to your appointment. ° °2) Contact Your Local Health Department °Not all health departments have doctors that can see patients for sick visits, but many do, so it is worth a call to see if yours does. If you don't know where your local health department is, you can check in your phone book. The CDC also has a tool to help you locate your state's health department, and many state websites also have listings of all of their local health departments. ° °3) Find a Walk-in Clinic °If your illness is not likely to be very severe or complicated, you may want to try a walk in clinic. These are popping up all over the country in pharmacies, drugstores, and shopping centers. They're usually staffed by nurse practitioners or physician assistants that have been trained to treat common illnesses and complaints. They're usually fairly quick and inexpensive. However, if you have serious medical issues or chronic medical problems, these are probably not your best option. ° °No Primary Care Doctor: °- Call [facility] at  832-8000 - they can help you locate a primary care doctor that  accepts your insurance, provides certain services, etc. °- Physician Referral Service- 1-800-533-3463 ° °Chronic Pain Problems: °Organization         Address  Phone   Notes  °[facility]  (336) 297-2271 Patients need to be referred by their primary care doctor.  ° °Medication  Assistance: °Organization         Address  Phone   Notes  °[place] [facility] [address] °[place], [place] [address] (336) 641-8030 --Must be a resident of [place] °-- Must have NO insurance coverage whatsoever (no Medicaid/ Medicare, etc.) °-- The pt. MUST have a primary care doctor that directs their care regularly and follows them in the community °  °[facility]  (866) 331-1348   °[employer]  (888) 892-1162   ° °Agencies that provide inexpensive medical care: °Organization         Address  Phone   Notes  °[facility]  (336) 832-8035   °[facility]    (336) 832-7272   °[facility] [address] °[place], [place] [address] (336) 832-4777   °[facility] [address], °[place] (336) 271-4999   °[facility]    (336) 373-0678   °[facility]    (336) 272-1050   °[facility] ° [address], [place] Phone:  (336) 832-4444, Fax:  (336) 832-4440 Hours of Operation:  9 am - 6 pm, M-F.  Also accepts Medicaid/Medicare and self-pay.  °[facility] ° [address],  Phone: (336) 832-3150, Fax: (336) 832-3151. Hours of Operation:  8:30 am - 5:30 pm, M-F.  Also accepts Medicaid and self-pay.  °[facility] [place] [address], [place] Phone: (336) 878-6027   °[facility] [address], [place], [place] (336)723-1848, Ext. 123 Mondays & Thursdays: 7-9 AM.  First 15 patients are seen on a first come, first serve basis. °  ° °Medicaid-accepting [place] Providers: ° °Organization         Address  Phone   Notes  °[facility] [address], [place] (336) 641-2100 Also accepts self-pay patients.  °[facility] [address], [place] ° (336) 856-9996   °[facility] [address], [place]  (336) 288-8857   °[facility] [address] [place] [address], [place] (336) 299-7000   °Veita Bland [address], [place]  ° (336) 373-1557 Only accepts [place] Access Medicaid patients after they have their name applied to their card.  ° °Self-Pay (no insurance) in [place]: ° °Organization         Address  Phone   Notes  °Sickle Cell Patients, [facility] [address], [place] (336) 832-1970   °[facility] [address], [place] (336) 832-4400   °[facility] ° [address], [place] (336) 992-4800   °[facility]/Dr. Osei-Bonsu ° [address] [place] [address], [place] or [address], [place] (336) 841-8500 Phone number for both [place] and [place] locations is the same.  °[facility] [address], [place] (336) 299-0000   °[facility] [address] [place] [address], [place] or [address] (336) 852-7530 °(336) 878-2260   °[facility] [address], [place] (336) 350-1642, phone; (336) 294-5005, fax Sees patients 1st and 3rd Saturday of every month.  Must not qualify for public or private insurance (i.e. Medicaid, Medicare, [place] Health Choice, Veterans' Benefits) • Household income should be no more than 200% of the poverty level •The clinic cannot treat you if you are pregnant or think you are pregnant • Sexually transmitted diseases are not treated at the clinic.  ° ° °Dental Care: °Organization         Address  Phone  Notes  °[place] [facility] [address], [place] (336) 641-6152 Accepts children up to age 21 who are enrolled in Medicaid or [place] Health Choice; pregnant women with a Medicaid card; and children who have applied for Medicaid or [place] Health Choice, but were declined, whose parents can pay a reduced fee at time of service.  °[place]  [facility] [place]  [address], [place] (336) 641-7733 Accepts children up to age 21 who are enrolled in Medicaid or [place] Health Choice; pregnant women with a Medicaid card; and children who have applied for Medicaid or [place] Health Choice, but were declined, whose parents can pay a reduced fee at time of service.  °[facility] ° [address],  (336) 641-4533 Patients are seen by appointment only. Walk-ins are not accepted. [facility] will see patients 18 years of age and older. °Monday - Tuesday (8am-5pm) °Most Wednesdays (8:30-5pm) °$30 per visit, cash only  °[facility] ° [address], [place] (336) 641-4533 Patients are seen by appointment only. Walk-ins are not accepted. [facility] will see patients 18 years of age and older. °One   Wednesday Evening (Monthly: Volunteer Based).  $30 per visit, cash only  °[facility]  (919) 537-3737 for adults; Children under age 4, call Graduate Pediatric Dentistry at ([phone]) [phone]. Children aged 4-14, please call ([phone]) [phone] to request a pediatric application. ° Dental services are provided in all areas of dental care including fillings, crowns and bridges, complete and partial dentures, implants, gum treatment, root canals, and extractions. Preventive care is also provided. Treatment is provided to both adults and children. °Patients are selected via a lottery and there is often a waiting list. °  °[facility] [address], °[place] ° (336) 763-8833 [email or web address] °  °[facility] [address], [place], [place] (336)723-1848, Ext. 123 Second and Fourth Thursday of each month, opens at 6:30 AM; Clinic ends at 9 AM.  Patients are seen on a first-come first-served basis, and a limited number are seen during each clinic.  ° °[facility] ° [address], [place], [place] (336) 723-7904    Eligibility Requirements °You must have lived in [place], or [place] counties for at least the last three months. °  You cannot be eligible for state or federal sponsored healthcare insurance, including Veterans Administration, Medicaid, or Medicare. °  You generally cannot be eligible for healthcare insurance through your employer.  °  How to apply: °Eligibility screenings are held every Tuesday and Wednesday afternoon from 1:00 pm until 4:00 pm. You do not need an appointment for the interview!  °[facility] [address], [place], [place] 336-631-2330   °[facility]  336-342-8273   °[facility]  336-703-3100   °[facility]  336-570-6415   ° °Behavioral Health Resources in the Community: °Intensive Outpatient Programs °Organization         Address  Phone  Notes  °[facility] [address], [place], [place] 336-878-6098   °[facility] [address], [place], [place] 336-832-9800   °[facility] [address], [place], [place] ° 336-882-2125   °[facility] [address],  °[place], [place] 1-800-853-5163 or 336-641-4981   °Substance Abuse Resources °Organization         Address  Phone  Notes  °[facility]  336-882-2125   °[facility]  336-784-9470   °[facility]  336-285-9073   °[facility]  336-845-3988   °Residential & Outpatient Substance Abuse Program  1-800-659-3381   °Psychological Services °Organization         Address  Phone  Notes  °[facility]  336- 832-9600   °[facility]  336- 378-7881   °[facility] [address], [place] 1-800-853-5163 or 336-641-4981   ° °Mobile Crisis Teams °Organization         Address  Phone  Notes  °[facility], Mobile Crisis Care Unit  1-877-626-1772   °Assertive °[facility] ° [address]  [place], [place] 336-834-9664   °Sharon DeEsch [address] °[place] 336-554-5454   ° °Self-Help/Support Groups °Organization         Address  Phone             Notes  °[facility] [place] - variety of support groups  336- 373-1402 Call for more information  °Narcotics Anonymous (NA), [facility] [address], °[place]  2 meetings at this location  ° °  Residential Treatment Programs °Organization         Address  Phone  Notes  °[facility] [address],    °[place]  1-866-801-8205   °[facility] ° [address], [place], [place] 704-293-8524   °[facility] [address], [place] 336-845-3988 Admissions: 8am-3pm M-F  °[facility] [address]    °[place], [place] 336-841-1104   °[facility] [address], [place], [place] 336-379-7146   °[facility] [address]  °[place], [place] 336-285-9073   °[facility] - Intensive Outpatient [address], [place], [place] 336-852-3033   °[facility] [address]  °[place], [place] 1-877-615-2722 or 336-784-9470   °[facility] [address] [place], [place] 336-227-7417 Accepts Medicaid  °[facility] [address]  °[place] 1-800-659-3381 Substance Abuse/Addiction Treatment  ° °[place] Behavioral Health Resources °Organization         Address  Phone  Notes  °[facility]  (888) 581-9988   °Julie Brannon, PhD [address] [place], [place]   (336) 349-5553 or (336) 951-0000   °[facility]   [address] °[place], [place] (336) 349-4454   °[facility] [address], [place], [place] (336) 342-8316 Insurance/Medicaid/sponsorship through [facility]  °[facility] [address]                                    [place], [place] (336) 342-8316 Therapy/tele-psych/case    °[facility] [address]  ° [place], [place], [place] (336) 349-2233    °Dr. Arfeen  (336) 349-4544   °[facility] [place]  [employer] [place] [facility] 1) [address],  °2) [address], [place] °3)  [address], [place] (336) 349-3220 °(336) 342-7768 ° °(336) 342-8140   °[place] Child Abuse [facility] (336) 342-1394 or (336) 342-3537 (After Hours)    ° ° °

## 2016-04-24 ENCOUNTER — Telehealth: Payer: Self-pay | Admitting: Emergency Medicine

## 2016-04-24 NOTE — Telephone Encounter (Signed)
Left a message for patient to return a call.In regard to her last visit

## 2016-04-25 ENCOUNTER — Telehealth: Payer: Self-pay | Admitting: *Deleted

## 2016-04-25 LAB — URINE CULTURE

## 2016-04-25 NOTE — Telephone Encounter (Signed)
Pt reports her UTI symptoms are improving but she is still experiencing some frequency. Her cough is still persistent but also improved. Encouraged to complete antibiotic course as + UCX.

## 2016-05-12 ENCOUNTER — Emergency Department (INDEPENDENT_AMBULATORY_CARE_PROVIDER_SITE_OTHER)
Admission: EM | Admit: 2016-05-12 | Discharge: 2016-05-12 | Disposition: A | Discharge status: Home / Self Care | Payer: No Typology Code available for payment source | Source: Home / Self Care | Attending: Emergency Medicine | Admitting: Emergency Medicine

## 2016-05-12 ENCOUNTER — Encounter: Payer: Self-pay | Admitting: *Deleted

## 2016-05-12 DIAGNOSIS — L739 Follicular disorder, unspecified: Secondary | ICD-10-CM | POA: Diagnosis not present

## 2016-05-12 DIAGNOSIS — R300 Dysuria: Secondary | ICD-10-CM

## 2016-05-12 LAB — POCT URINALYSIS DIP (MANUAL ENTRY)
Bilirubin, UA: NEGATIVE
Glucose, UA: NEGATIVE
Ketones, POC UA: NEGATIVE
Leukocytes, UA: NEGATIVE
Nitrite, UA: NEGATIVE
Protein Ur, POC: NEGATIVE
Spec Grav, UA: 1.01 (ref 1.005–1.03)
Urobilinogen, UA: 0.2 (ref 0–1)
pH, UA: 6 (ref 5–8)

## 2016-05-12 MED ORDER — HYDROCODONE-ACETAMINOPHEN 5-325 MG PO TABS: 1.0000 | ORAL_TABLET | Freq: Every evening | ORAL | 0 refills | Status: DC | PRN

## 2016-05-12 MED ORDER — SULFAMETHOXAZOLE-TRIMETHOPRIM 800-160 MG PO TABS: 1.0000 | ORAL_TABLET | Freq: Two times a day (BID) | ORAL | 0 refills | Status: AC

## 2016-05-12 NOTE — ED Provider Notes (Signed)
[facility]    CSN: [id number] Arrival date & time: 05/12/16  1149     History   Chief Complaint Chief Complaint  Patient presents with  . Polyuria  . Recurrent Skin Infections    Vaginal    HPI Krystal Valdez is a 20 y.o. female.   HPI  Complains of painful infection of hair bumps after shaving right inguinal area. No discharge. No bleeding from the hair-bump area. No fever, chills, n, vom, cardioresp sxs, abdom pain, or back pain.  Also some UTI symptoms of dysuria and urinary frequency. Denies itch.  Denies vaginal discharge.  Last menstrual period started yesterday. She denies chance of pregnancy. Here with her mom and her toddler son.   Past Medical History:  Diagnosis Date  . Allergy   . Asthma   . Asthma   . Left breast abscess 04/02/2013   Will change antibiotics to septra Ds and refer to Dr Lovell SheehanJenkins 8/21 at 11 am  . Mastitis, left, acute 03/28/2013   rx augmentin.  Marland Kitchen. UTI (lower urinary tract infection)     Patient Active Problem List   Diagnosis Date Noted  . Left breast abscess 04/02/2013  . Mastitis, left, acute 03/28/2013  . Vaginal delivery 03/14/2013  . Asthma 11/02/2012    Past Surgical History:  Procedure Laterality Date  . INCISION AND DRAINAGE ABSCESS Left 04/05/2013   Procedure: INCISION AND DRAINAGE LEFT BREAST ABSCESS;  Surgeon: Dalia HeadingMark A Jenkins, MD;  Location: [facility];  Service: General;  Laterality: Left;  . MYRINGOTOMY     bilateral, x 2  . NO PAST SURGERIES      OB History    Gravida Para Term Preterm AB Living   2 1 1     1    SAB TAB Ectopic Multiple Live Births           1       Home Medications    Prior to Admission medications   Medication Sig Start Date End Date Taking? Authorizing Provider  albuterol (PROVENTIL HFA;VENTOLIN HFA) 108 (90 Base) MCG/ACT inhaler Inhale 1-2 puffs into the lungs every 6 (six) hours as needed for wheezing or shortness of breath. 04/22/16   Junius FinnerErin O'Malley, PA-C    HYDROcodone-acetaminophen (NORCO/VICODIN) 5-325 MG tablet Take 1-2 tablets by mouth at bedtime as needed for severe pain. Take with food. 05/12/16   Lajean Manesavid Massey, MD  sulfamethoxazole-trimethoprim (BACTRIM DS,SEPTRA DS) 800-160 MG tablet Take 1 tablet by mouth 2 (two) times daily. For 10 days 05/12/16 05/22/16  Lajean Manesavid Massey, MD  trimethoprim-polymyxin b (POLYTRIM) ophthalmic solution Place 1 drop into the right eye every 4 (four) hours. For 5 days 03/02/16   Junius FinnerErin O'Malley, PA-C    Family History Family History  Problem Relation Age of Onset  . Hypertension Other   . Diabetes Other   . Cancer Other     hx breast,colon,cervical cancer  . Hypertension Father   . Hyperlipidemia Father   . Diabetes Paternal Grandfather     Social History Social History  Substance Use Topics  . Smoking status: Current Every Day Smoker    Packs/day: 1.00    Years: 4.00    Types: Cigarettes  . Smokeless tobacco: Never Used     Comment: 1/2 pack a day  . Alcohol use No     Allergies   Review of patient's allergies indicates no known allergies.   Review of Systems Review of Systems  All other systems reviewed and are negative.  Physical Exam Triage Vital Signs ED Triage Vitals  Enc Vitals Group     BP 05/12/16 1247 117/63     Pulse Rate 05/12/16 1247 90     Resp --      Temp 05/12/16 1247 97.9 F (36.6 C)     Temp Source 05/12/16 1247 Oral     SpO2 05/12/16 1247 97 %     Weight 05/12/16 1247 151 lb (68.5 kg)     Height --      Head Circumference --      Peak Flow --      Pain Score 05/12/16 1301 10     Pain Loc --      Pain Edu? --      Excl. in GC? --    No data found.   Updated Vital Signs BP 117/63 (BP Location: Left Arm)   Pulse 90   Temp 97.9 F (36.6 C) (Oral)   Wt 151 lb (68.5 kg)   LMP 05/12/2016   SpO2 97%   BMI 23.65 kg/m   Visual Acuity Right Eye Distance:   Left Eye Distance:   Bilateral Distance:    Right Eye Near:   Left Eye Near:    Bilateral  Near:     Physical Exam  Constitutional: She is oriented to person, place, and time. She appears well-developed and well-nourished. No distress.  HENT:  Head: Normocephalic and atraumatic.  Eyes: Conjunctivae and EOM are normal. Pupils are equal, round, and reactive to light. No scleral icterus.  Neck: Normal range of motion.  Cardiovascular: Normal rate.   Pulmonary/Chest: Effort normal.  Abdominal: She exhibits no distension.  Genitourinary:     Musculoskeletal: Normal range of motion.  Neurological: She is alert and oriented to person, place, and time.  Skin: Skin is warm.  + folliculitis rash,ttp, see above  Psychiatric: She has a normal mood and affect.  Nursing note and vitals reviewed. No flank or CVAT.  Urine Cx 3 weeks ago grew out E Coli, sensitive to Septra.  UC Treatments / Results  Labs (all labs ordered are listed, but only abnormal results are displayed) Labs Reviewed  POCT URINALYSIS DIP (MANUAL ENTRY) - Abnormal; Notable for the following:       Result Value   Blood, UA moderate (*)    All other components within normal limits    Pt declined urine cx today.  Procedures Procedures (including critical care time)  Medications Ordered in UC Medications - No data to display   Initial Impression / Assessment and Plan / UC Course  I have reviewed the triage vital signs and the nursing notes.  Pertinent labs results that were available during my care of the patient were reviewed by me and considered in my medical decision making (see chart for details).  Final Clinical Impressions(s) / UC Diagnoses   Final diagnoses:  Acute folliculitis  Dysuria  Possible UTI. Pt requests abx to cover folliculits and UTI.   Treatment options discussed, as well as risks, benefits, alternatives. Patient and mother voiced understanding and agreement with the following plans:  New Prescriptions Discharge Medication List as of 05/12/2016  1:14 PM    START taking these  medications   Details  HYDROcodone-acetaminophen (NORCO/VICODIN) 5-325 MG tablet Take 1-2 tablets by mouth at bedtime as needed for severe pain. Take with food., Starting Thu 05/12/2016, Print    sulfamethoxazole-trimethoprim (BACTRIM DS,SEPTRA DS) 800-160 MG tablet Take 1 tablet by mouth 2 (two) times daily. For 10 days,  Starting Thu 05/12/2016, Until Sun 05/22/2016, Normal      Per Mother and Patient request, small prescription of Vicodin prn pain. #12, No RF.  I explained we cannot refill this. Other advice given. Avoid shaving skin.   Follow-up with your gynecologist in 5-7 days if not improving, or sooner if symptoms become worse. Precautions discussed. Red flags discussed.--ER if any red flags. Questions invited and answered. They voiced understanding and agreement.    Lajean Manes, MD 05/17/16 [phone]

## 2016-05-12 NOTE — ED Triage Notes (Signed)
Pt s/o sore to external labia x 1 week. Denies drainage. Reports h/o sores in axilla. She also c/o malodorous urine, polyuria and flank pain that she feels never went away from the last uti.

## 2016-05-14 ENCOUNTER — Telehealth: Payer: Self-pay | Admitting: Emergency Medicine

## 2016-05-14 NOTE — Telephone Encounter (Signed)
Inquired about patient's status; encourage them to call with questions/concerns.  

## 2016-06-29 ENCOUNTER — Encounter: Payer: Self-pay | Admitting: Emergency Medicine

## 2016-06-29 ENCOUNTER — Emergency Department (INDEPENDENT_AMBULATORY_CARE_PROVIDER_SITE_OTHER)
Admission: EM | Admit: 2016-06-29 | Discharge: 2016-06-29 | Disposition: A | Discharge status: Home / Self Care | Payer: No Typology Code available for payment source | Source: Home / Self Care | Attending: Family Medicine | Admitting: Family Medicine

## 2016-06-29 DIAGNOSIS — J209 Acute bronchitis, unspecified: Secondary | ICD-10-CM

## 2016-06-29 DIAGNOSIS — N3090 Cystitis, unspecified without hematuria: Secondary | ICD-10-CM

## 2016-06-29 HISTORY — DX: Pneumonia, unspecified organism: J18.9

## 2016-06-29 LAB — POCT URINALYSIS DIP (MANUAL ENTRY)
Bilirubin, UA: NEGATIVE
Blood, UA: NEGATIVE
Glucose, UA: NEGATIVE
Ketones, POC UA: NEGATIVE
Nitrite, UA: NEGATIVE
PROTEIN UA: NEGATIVE
SPEC GRAV UA: 1.015 (ref 1.005–1.03)
Urobilinogen, UA: 0.2 (ref 0–1)
pH, UA: 7 (ref 5–8)

## 2016-06-29 MED ORDER — LEVOFLOXACIN 500 MG PO TABS: 500.0000 mg | ORAL_TABLET | Freq: Every day | ORAL | 0 refills | Status: DC

## 2016-06-29 MED ORDER — PREDNISONE 20 MG PO TABS: ORAL_TABLET | ORAL | 0 refills | Status: DC

## 2016-06-29 MED ORDER — METHYLPREDNISOLONE SODIUM SUCC 125 MG IJ SOLR
125.0000 mg | Freq: Once | INTRAMUSCULAR | Status: AC
  Administered 2016-06-29: 125 mg via INTRAMUSCULAR

## 2016-06-29 MED ORDER — ALBUTEROL SULFATE HFA 108 (90 BASE) MCG/ACT IN AERS: 1.0000 | INHALATION_SPRAY | Freq: Four times a day (QID) | RESPIRATORY_TRACT | 1 refills | Status: DC | PRN

## 2016-06-29 MED ORDER — GUAIFENESIN-CODEINE 100-10 MG/5ML PO SOLN: ORAL | 0 refills | Status: DC

## 2016-06-29 NOTE — ED Triage Notes (Signed)
Patient has congestion and cough for past 4 days; no fever; this is going through family. She coincidentally has some dysuria and has frequent UTIs but no meds to cover if current.

## 2016-06-29 NOTE — Discharge Instructions (Signed)
Begin prednisone on Thursday 06/30/16. Take plain guaifenesin (1200mg  extended release tabs such as Mucinex) twice daily, with plenty of water, for cough and congestion.  May add Pseudoephedrine (30mg , one or two every 4 to 6 hours) for sinus congestion.  Get adequate rest.   May use Afrin nasal spray (or generic oxymetazoline) twice daily for about 5 days and then discontinue.  Also recommend using saline nasal spray several times daily and saline nasal irrigation (AYR is a common brand).  Use Flonase nasal spray each morning after using Afrin nasal spray and saline nasal irrigation. Try warm salt water gargles for sore throat.  Stop all antihistamines for now, and other non-prescription cough/cold preparations.   Follow-up with family doctor if not improving about one week.

## 2016-06-29 NOTE — ED Provider Notes (Signed)
[facility]    CSN: [id number] Arrival date & time: 06/29/16  1955     History   Chief Complaint Chief Complaint  Patient presents with  . Nasal Congestion  . Generalized Body Aches    HPI Krystal Valdez is a 20 y.o. female.   Patient complains of three day history of typical cold-like symptoms developing over several days, including mild sore throat, sinus congestion, headache, fatigue, and cough.  She feels tight in her anterior chest, but no wheezing or shortness of breath.  She developed dysuria about 3 to 4 days ago. She states that she is under increased stress as a result of recent deaths in her family. She has a past history of pneumonia about 6 weeks ago, and she continues to smoke.   The history is provided by the patient.    Past Medical History:  Diagnosis Date  . Allergy   . Asthma   . Asthma   . Left breast abscess 04/02/2013   Will change antibiotics to septra Ds and refer to Dr Lovell SheehanJenkins 8/21 at 11 am  . Mastitis, left, acute 03/28/2013   rx augmentin.  . Pneumonia   . UTI (lower urinary tract infection)     Patient Active Problem List   Diagnosis Date Noted  . Left breast abscess 04/02/2013  . Mastitis, left, acute 03/28/2013  . Vaginal delivery 03/14/2013  . Asthma 11/02/2012    Past Surgical History:  Procedure Laterality Date  . INCISION AND DRAINAGE ABSCESS Left 04/05/2013   Procedure: INCISION AND DRAINAGE LEFT BREAST ABSCESS;  Surgeon: Dalia HeadingMark A Jenkins, MD;  Location: [facility];  Service: General;  Laterality: Left;  . MYRINGOTOMY     bilateral, x 2  . NO PAST SURGERIES      OB History    Gravida Para Term Preterm AB Living   2 1 1     1    SAB TAB Ectopic Multiple Live Births           1       Home Medications    Prior to Admission medications   Medication Sig Start Date End Date Taking? Authorizing Provider  albuterol (PROVENTIL HFA;VENTOLIN HFA) 108 (90 Base) MCG/ACT inhaler Inhale 1-2 puffs into the lungs every 6  (six) hours as needed for wheezing or shortness of breath. 06/29/16   Lattie HawStephen A Beese, MD  guaiFENesin-codeine 100-10 MG/5ML syrup Take 10mL by mouth at bedtime as needed for cough 06/29/16   Lattie HawStephen A Beese, MD  levofloxacin (LEVAQUIN) 500 MG tablet Take 1 tablet (500 mg total) by mouth daily. 06/29/16   Lattie HawStephen A Beese, MD  predniSONE (DELTASONE) 20 MG tablet Take one tab by mouth twice daily for 4 days, then one daily for 3 days. Take with food. 06/29/16   Lattie HawStephen A Beese, MD    Family History Family History  Problem Relation Age of Onset  . Hypertension Other   . Diabetes Other   . Cancer Other     hx breast,colon,cervical cancer  . Hypertension Father   . Hyperlipidemia Father   . Diabetes Paternal Grandfather     Social History Social History  Substance Use Topics  . Smoking status: Current Every Day Smoker    Packs/day: 1.00    Years: 4.00    Types: Cigarettes  . Smokeless tobacco: Never Used     Comment: 1/2 pack a day  . Alcohol use No     Allergies   Patient has no known allergies.  Review of Systems Review of Systems + sore throat + cough No pleuritic pain, but feels tight in chest with inspiration. No wheezing + nasal congestion + post-nasal drainage No sinus pain/pressure No itchy/red eyes ? earache No hemoptysis No SOB No fever, + chills No nausea No vomiting No abdominal pain No diarrhea + dysuria No skin rash + fatigue No myalgias + headache Used OTC meds without relief   Physical Exam Triage Vital Signs ED Triage Vitals  Enc Vitals Group     BP 06/29/16 2009 (!) 103/54     Pulse Rate 06/29/16 2009 94     Resp 06/29/16 2009 16     Temp 06/29/16 2009 97.8 F (36.6 C)     Temp Source 06/29/16 2009 Oral     SpO2 06/29/16 2009 99 %     Weight 06/29/16 2010 150 lb (68 kg)     Height 06/29/16 2010 5\' 7"  (1.702 m)     Head Circumference --      Peak Flow --      Pain Score 06/29/16 2012 1     Pain Loc --      Pain Edu? --       Excl. in GC? --    No data found.   Updated Vital Signs BP (!) 103/54 (BP Location: Left Arm)   Pulse 94   Temp 97.8 F (36.6 C) (Oral)   Resp 16   Ht 5\' 7"  (1.702 m)   Wt 150 lb (68 kg)   LMP 06/15/2016   SpO2 99%   BMI 23.49 kg/m   Visual Acuity Right Eye Distance:   Left Eye Distance:   Bilateral Distance:    Right Eye Near:   Left Eye Near:    Bilateral Near:     Physical Exam Nursing notes and Vital Signs reviewed. Appearance:  Patient appears stated age, and in no acute distress Eyes:  Pupils are equal, round, and reactive to light and accomodation.  Extraocular movement is intact.  Conjunctivae are not inflamed  Ears:  Canals normal.  Tympanic membranes normal.  Nose:  Mildly congested turbinates.  No sinus tenderness.   Pharynx:  Normal Neck:  Supple.  Tender enlarged posterior/lateral nodes are palpated bilaterally  Lungs:   Faint expiratory wheezes heard over right anterior chest.  Breath sounds are equal.  Moving air well. Chest:  Distinct tenderness to palpation over the mid-sternum.  Heart:  Regular rate and rhythm without murmurs, rubs, or gallops.  Abdomen:  Nontender without masses or hepatosplenomegaly.  Bowel sounds are present.  No CVA or flank tenderness.  Extremities:  No edema.  Skin:  No rash present.    UC Treatments / Results  Labs (all labs ordered are listed, but only abnormal results are displayed) Labs Reviewed  POCT URINALYSIS DIP (MANUAL ENTRY) - Abnormal; Notable for the following:       Result Value   Color, UA light yellow (*)    Clarity, UA hazy (*)    Leukocytes, UA Trace (*)    All other components within normal limits  URINE CULTURE    EKG  EKG Interpretation None       Radiology No results found.  Procedures Procedures (including critical care time)  Medications Ordered in UC Medications  methylPREDNISolone sodium succinate (SOLU-MEDROL) 125 mg/2 mL injection 125 mg (125 mg Intramuscular Given 06/29/16 2050)       Initial Impression / Assessment and Plan / UC Course  I have reviewed the triage  vital signs and the nursing notes.  Pertinent labs & imaging results that were available during my care of the patient were reviewed by me and considered in my medical decision making (see chart for details).  Clinical Course   Note that patient is under significant stress with recent deaths in family. Urine culture pending. Begin Levaquin to cover both UTI and respiratory bacteria. Administered Solumedrol 125mg  IM. Refill albuterol inhaler. Rx for Robitussin AC for night time cough.  Begin prednisone burst/taper on Thursday 06/30/16. Take plain guaifenesin (1200mg  extended release tabs such as Mucinex) twice daily, with plenty of water, for cough and congestion.  May add Pseudoephedrine (30mg , one or two every 4 to 6 hours) for sinus congestion.  Get adequate rest.   May use Afrin nasal spray (or generic oxymetazoline) twice daily for about 5 days and then discontinue.  Also recommend using saline nasal spray several times daily and saline nasal irrigation (AYR is a common brand).  Use Flonase nasal spray each morning after using Afrin nasal spray and saline nasal irrigation. Try warm salt water gargles for sore throat.  Stop all antihistamines for now, and other non-prescription cough/cold preparations.   Follow-up with family doctor if not improving about one week.     Final Clinical Impressions(s) / UC Diagnoses   Final diagnoses:  Bronchospasm with bronchitis, acute  Cystitis    New Prescriptions New Prescriptions   GUAIFENESIN-CODEINE 100-10 MG/5ML SYRUP    Take 10mL by mouth at bedtime as needed for cough   LEVOFLOXACIN (LEVAQUIN) 500 MG TABLET    Take 1 tablet (500 mg total) by mouth daily.   PREDNISONE (DELTASONE) 20 MG TABLET    Take one tab by mouth twice daily for 4 days, then one daily for 3 days. Take with food.     Lattie HawStephen A Beese, MD 07/02/16 [phone]

## 2016-07-01 LAB — URINE CULTURE

## 2016-07-02 ENCOUNTER — Telehealth: Payer: Self-pay | Admitting: Emergency Medicine

## 2016-08-04 ENCOUNTER — Emergency Department (INDEPENDENT_AMBULATORY_CARE_PROVIDER_SITE_OTHER)
Admission: EM | Admit: 2016-08-04 | Discharge: 2016-08-04 | Disposition: A | Discharge status: Home / Self Care | Payer: No Typology Code available for payment source | Source: Home / Self Care | Attending: Family Medicine | Admitting: Family Medicine

## 2016-08-04 ENCOUNTER — Encounter: Payer: Self-pay | Admitting: Emergency Medicine

## 2016-08-04 DIAGNOSIS — L02211 Cutaneous abscess of abdominal wall: Secondary | ICD-10-CM | POA: Diagnosis not present

## 2016-08-04 MED ORDER — ONDANSETRON 4 MG PO TBDP
4.0000 mg | ORAL_TABLET | Freq: Once | ORAL | Status: AC
  Administered 2016-08-04: 4 mg via ORAL

## 2016-08-04 MED ORDER — HYDROCODONE-ACETAMINOPHEN 5-325 MG PO TABS: 1.0000 | ORAL_TABLET | Freq: Four times a day (QID) | ORAL | 0 refills | Status: DC | PRN

## 2016-08-04 MED ORDER — CEFTRIAXONE SODIUM 1 G IJ SOLR: 1.0000 g | Freq: Once | INTRAMUSCULAR | Status: DC

## 2016-08-04 MED ORDER — IBUPROFEN 600 MG PO TABS
600.0000 mg | ORAL_TABLET | Freq: Once | ORAL | Status: AC
  Administered 2016-08-04: 600 mg via ORAL

## 2016-08-04 MED ORDER — IBUPROFEN 600 MG PO TABS: 600.0000 mg | ORAL_TABLET | Freq: Four times a day (QID) | ORAL | 0 refills | Status: DC | PRN

## 2016-08-04 MED ORDER — SULFAMETHOXAZOLE-TRIMETHOPRIM 800-160 MG PO TABS: 1.0000 | ORAL_TABLET | Freq: Two times a day (BID) | ORAL | 0 refills | Status: AC

## 2016-08-04 NOTE — ED Provider Notes (Signed)
CSN: [id number]     Arrival date & time 08/04/16  1149 History   First MD Initiated Contact with Patient 08/04/16 1206     Chief Complaint  Patient presents with  . Abscess   (Consider location/radiation/quality/duration/timing/severity/associated sxs/prior Treatment) HPI  Krystal Valdez is a 20 y.o. female presenting to UC with c/o a small nodule above pelvis for about 6 months.  This past week she noticed some redness so she squeezed the area, causing it to drain a small amount of "white stuff."  Two days ago she developed moderate to severe pain in the area with worsening redness and swelling. It has not drained on its own since she initially squeezed the area. Pain is 10/10, throbbing, no fever, chills n/v/d.     Past Medical History:  Diagnosis Date  . Allergy   . Asthma   . Asthma   . Left breast abscess 04/02/2013   Will change antibiotics to septra Ds and refer to Dr Lovell SheehanJenkins 8/21 at 11 am  . Mastitis, left, acute 03/28/2013   rx augmentin.  . Pneumonia   . UTI (lower urinary tract infection)    Past Surgical History:  Procedure Laterality Date  . INCISION AND DRAINAGE ABSCESS Left 04/05/2013   Procedure: INCISION AND DRAINAGE LEFT BREAST ABSCESS;  Surgeon: Dalia HeadingMark A Jenkins, MD;  Location: [facility];  Service: General;  Laterality: Left;  . MYRINGOTOMY     bilateral, x 2  . NO PAST SURGERIES     Family History  Problem Relation Age of Onset  . Hypertension Other   . Diabetes Other   . Cancer Other     hx breast,colon,cervical cancer  . Hypertension Father   . Hyperlipidemia Father   . Diabetes Paternal Grandfather    Social History  Substance Use Topics  . Smoking status: Current Every Day Smoker    Packs/day: 1.00    Years: 4.00    Types: Cigarettes  . Smokeless tobacco: Never Used     Comment: 1/2 pack a day  . Alcohol use No   OB History    Gravida Para Term Preterm AB Living   2 1 1     1    SAB TAB Ectopic Multiple Live Births           1     Review of  Systems  Constitutional: Negative for chills and fever.  Gastrointestinal: Positive for abdominal pain (skin in suprapubic region). Negative for diarrhea, nausea and vomiting.  Musculoskeletal: Negative for arthralgias and myalgias.  Skin: Positive for color change. Negative for wound.    Allergies  Patient has no known allergies.  Home Medications   Prior to Admission medications   Medication Sig Start Date End Date Taking? Authorizing Provider  albuterol (PROVENTIL HFA;VENTOLIN HFA) 108 (90 Base) MCG/ACT inhaler Inhale 1-2 puffs into the lungs every 6 (six) hours as needed for wheezing or shortness of breath. 06/29/16   Lattie HawStephen A Beese, MD  guaiFENesin-codeine 100-10 MG/5ML syrup Take 10mL by mouth at bedtime as needed for cough 06/29/16   Lattie HawStephen A Beese, MD  HYDROcodone-acetaminophen (NORCO/VICODIN) 5-325 MG tablet Take 1-2 tablets by mouth every 6 (six) hours as needed for moderate pain or severe pain. 08/04/16   Junius FinnerErin O'Malley, PA-C  ibuprofen (ADVIL,MOTRIN) 600 MG tablet Take 1 tablet (600 mg total) by mouth every 6 (six) hours as needed. 08/04/16   Junius FinnerErin O'Malley, PA-C  levofloxacin (LEVAQUIN) 500 MG tablet Take 1 tablet (500 mg total) by mouth daily. 06/29/16  Lattie HawStephen A Beese, MD  predniSONE (DELTASONE) 20 MG tablet Take one tab by mouth twice daily for 4 days, then one daily for 3 days. Take with food. 06/29/16   Lattie HawStephen A Beese, MD  sulfamethoxazole-trimethoprim (BACTRIM DS,SEPTRA DS) 800-160 MG tablet Take 1 tablet by mouth 2 (two) times daily. 08/04/16 08/11/16  Junius FinnerErin O'Malley, PA-C   Meds Ordered and Administered this Visit   Medications  ondansetron (ZOFRAN-ODT) disintegrating tablet 4 mg (4 mg Oral Given 08/04/16 1243)  ibuprofen (ADVIL,MOTRIN) tablet 600 mg (600 mg Oral Given 08/04/16 1243)    BP 116/73 (BP Location: Left Arm)   Pulse 90   Temp 97.8 F (36.6 C) (Oral)   Ht 5\' 7"  (1.702 m)   Wt 145 lb (65.8 kg)   LMP 07/15/2016 (Exact Date)   SpO2 97%   BMI 22.71  kg/m  No data found.   Physical Exam  Constitutional: She is oriented to person, place, and time. She appears well-developed and well-nourished.  HENT:  Head: Normocephalic and atraumatic.  Eyes: EOM are normal.  Neck: Normal range of motion.  Cardiovascular: Normal rate.   Pulmonary/Chest: Effort normal.  Musculoskeletal: Normal range of motion.  Neurological: She is alert and oriented to person, place, and time.  Skin: Skin is warm and dry. There is erythema.  Suprapubic region: 1cm area of erythema with 0.5cm centralized area of fluctuance. Tender. No bleeding or discharge.   Psychiatric: She has a normal mood and affect. Her behavior is normal.  Nursing note and vitals reviewed.   Urgent Care Course   Clinical Course     .Marland Kitchen.Incision and Drainage Date/Time: 08/04/2016 1:25 PM Performed by: Junius Finner'MALLEY, Alyaan Budzynski Authorized by: Donna ChristenBEESE, STEPHEN A   Consent:    Consent obtained:  Verbal   Consent given by:  Patient   Risks discussed:  Bleeding, incomplete drainage, infection and pain   Alternatives discussed:  Alternative treatment Location:    Type:  Abscess   Size:  0.5   Location:  Trunk   Trunk location:  Abdomen (suprapubic region) Pre-procedure details:    Skin preparation:  Antiseptic wash Anesthesia (see MAR for exact dosages):    Anesthesia method:  Local infiltration   Local anesthetic:  Lidocaine 2% w/o epi Procedure type:    Complexity:  Simple Procedure details:    Needle aspiration: no     Incision types:  Single straight   Incision depth:  Subcutaneous   Scalpel blade:  11   Wound management:  Probed and deloculated   Drainage:  Purulent and bloody   Drainage amount:  Scant   Wound treatment:  Wound left open   Packing materials:  None Post-procedure details:    Patient tolerance of procedure:  Tolerated well, no immediate complications   (including critical care time)  Labs Review Labs Reviewed  WOUND CULTURE    Imaging Review No results  found.     MDM   1. Cutaneous abscess of abdominal wall    Abscess I&D successfully. Wound culture sent. Rx: Bactrim (has done well on in the past), ibuprofen and norco Home care instructions provided.  F/u in 4-5 days if not improving, sooner if worsening.     Junius Finnerrin O'Malley, PA-C 08/04/16 1331

## 2016-08-04 NOTE — Discharge Instructions (Signed)
°  Please take antibiotics as prescribed and be sure to complete entire course even if you start to feel better to ensure infection does not come back. ° °Norco/Vicodin (hydrocodone-acetaminophen) is a narcotic pain medication, do not combine these medications with others containing tylenol. While taking, do not drink alcohol, drive, or perform any other activities that requires focus while taking these medications.  ° °

## 2016-08-04 NOTE — ED Triage Notes (Signed)
Abscess in pubic area x 6 months

## 2016-08-07 LAB — WOUND CULTURE
Gram Stain: NONE SEEN
Organism ID, Bacteria: NO GROWTH

## 2016-08-09 ENCOUNTER — Telehealth: Payer: Self-pay | Admitting: *Deleted

## 2016-08-09 NOTE — Telephone Encounter (Signed)
Callback: Patient reports site is much improved. WCX result given and discussed. Encouraged to complete antibiotics.

## 2017-04-14 DIAGNOSIS — F1120 Opioid dependence, uncomplicated: Secondary | ICD-10-CM | POA: Insufficient documentation

## 2022-01-10 ENCOUNTER — Other Ambulatory Visit: Payer: Self-pay

## 2022-01-10 ENCOUNTER — Emergency Department (HOSPITAL_COMMUNITY): Payer: Medicaid Other

## 2022-01-10 ENCOUNTER — Inpatient Hospital Stay (HOSPITAL_COMMUNITY)
Admission: EM | Admit: 2022-01-10 | Discharge: 2022-01-24 | DRG: 552 | Disposition: A | Discharge status: Home / Self Care | Payer: Medicaid Other | Attending: Internal Medicine | Admitting: Internal Medicine

## 2022-01-10 ENCOUNTER — Encounter (HOSPITAL_COMMUNITY): Payer: Self-pay

## 2022-01-10 DIAGNOSIS — E876 Hypokalemia: Secondary | ICD-10-CM

## 2022-01-10 DIAGNOSIS — F1120 Opioid dependence, uncomplicated: Secondary | ICD-10-CM | POA: Diagnosis not present

## 2022-01-10 DIAGNOSIS — Z8249 Family history of ischemic heart disease and other diseases of the circulatory system: Secondary | ICD-10-CM | POA: Diagnosis not present

## 2022-01-10 DIAGNOSIS — F1920 Other psychoactive substance dependence, uncomplicated: Secondary | ICD-10-CM | POA: Diagnosis not present

## 2022-01-10 DIAGNOSIS — M4656 Other infective spondylopathies, lumbar region: Principal | ICD-10-CM | POA: Diagnosis present

## 2022-01-10 DIAGNOSIS — A4101 Sepsis due to Methicillin susceptible Staphylococcus aureus: Secondary | ICD-10-CM | POA: Diagnosis not present

## 2022-01-10 DIAGNOSIS — Z8049 Family history of malignant neoplasm of other genital organs: Secondary | ICD-10-CM | POA: Diagnosis not present

## 2022-01-10 DIAGNOSIS — F1113 Opioid abuse with withdrawal: Secondary | ICD-10-CM | POA: Diagnosis not present

## 2022-01-10 DIAGNOSIS — B1920 Unspecified viral hepatitis C without hepatic coma: Secondary | ICD-10-CM | POA: Diagnosis present

## 2022-01-10 DIAGNOSIS — N390 Urinary tract infection, site not specified: Secondary | ICD-10-CM | POA: Diagnosis present

## 2022-01-10 DIAGNOSIS — F17210 Nicotine dependence, cigarettes, uncomplicated: Secondary | ICD-10-CM | POA: Diagnosis present

## 2022-01-10 DIAGNOSIS — M6008 Infective myositis, other site: Secondary | ICD-10-CM | POA: Diagnosis present

## 2022-01-10 DIAGNOSIS — F419 Anxiety disorder, unspecified: Secondary | ICD-10-CM | POA: Diagnosis not present

## 2022-01-10 DIAGNOSIS — F1510 Other stimulant abuse, uncomplicated: Secondary | ICD-10-CM | POA: Diagnosis present

## 2022-01-10 DIAGNOSIS — Z803 Family history of malignant neoplasm of breast: Secondary | ICD-10-CM | POA: Diagnosis not present

## 2022-01-10 DIAGNOSIS — Z833 Family history of diabetes mellitus: Secondary | ICD-10-CM

## 2022-01-10 DIAGNOSIS — F39 Unspecified mood [affective] disorder: Secondary | ICD-10-CM | POA: Diagnosis present

## 2022-01-10 DIAGNOSIS — O2342 Unspecified infection of urinary tract in pregnancy, second trimester: Secondary | ICD-10-CM | POA: Diagnosis present

## 2022-01-10 DIAGNOSIS — E871 Hypo-osmolality and hyponatremia: Secondary | ICD-10-CM | POA: Diagnosis present

## 2022-01-10 DIAGNOSIS — M4650 Other infective spondylopathies, site unspecified: Principal | ICD-10-CM

## 2022-01-10 DIAGNOSIS — R7881 Bacteremia: Secondary | ICD-10-CM | POA: Diagnosis present

## 2022-01-10 DIAGNOSIS — Z87440 Personal history of urinary (tract) infections: Secondary | ICD-10-CM

## 2022-01-10 DIAGNOSIS — R319 Hematuria, unspecified: Secondary | ICD-10-CM | POA: Diagnosis present

## 2022-01-10 DIAGNOSIS — M47816 Spondylosis without myelopathy or radiculopathy, lumbar region: Secondary | ICD-10-CM | POA: Diagnosis present

## 2022-01-10 DIAGNOSIS — D649 Anemia, unspecified: Secondary | ICD-10-CM | POA: Diagnosis not present

## 2022-01-10 DIAGNOSIS — D509 Iron deficiency anemia, unspecified: Secondary | ICD-10-CM

## 2022-01-10 DIAGNOSIS — I081 Rheumatic disorders of both mitral and tricuspid valves: Secondary | ICD-10-CM | POA: Diagnosis not present

## 2022-01-10 DIAGNOSIS — B9561 Methicillin susceptible Staphylococcus aureus infection as the cause of diseases classified elsewhere: Secondary | ICD-10-CM | POA: Diagnosis present

## 2022-01-10 DIAGNOSIS — Z8349 Family history of other endocrine, nutritional and metabolic diseases: Secondary | ICD-10-CM | POA: Diagnosis not present

## 2022-01-10 DIAGNOSIS — B957 Other staphylococcus as the cause of diseases classified elsewhere: Secondary | ICD-10-CM | POA: Diagnosis not present

## 2022-01-10 DIAGNOSIS — M1990 Unspecified osteoarthritis, unspecified site: Secondary | ICD-10-CM | POA: Diagnosis not present

## 2022-01-10 DIAGNOSIS — M0088 Arthritis due to other bacteria, vertebrae: Secondary | ICD-10-CM | POA: Diagnosis not present

## 2022-01-10 HISTORY — DX: Other infective spondylopathies, lumbar region: M46.56

## 2022-01-10 LAB — COMPREHENSIVE METABOLIC PANEL
ALT: 13 U/L (ref 0–44)
AST: 14 U/L — ABNORMAL LOW (ref 15–41)
Albumin: 3.8 g/dL (ref 3.5–5.0)
Alkaline Phosphatase: 71 U/L (ref 38–126)
Anion gap: 9 (ref 5–15)
BUN: 12 mg/dL (ref 6–20)
CO2: 27 mmol/L (ref 22–32)
Calcium: 9.2 mg/dL (ref 8.9–10.3)
Chloride: 100 mmol/L (ref 98–111)
Creatinine, Ser: 0.49 mg/dL (ref 0.44–1.00)
GFR, Estimated: >60 mL/min (ref >60)
Glucose, Bld: 94 mg/dL (ref 70–99)
Potassium: 3.8 mmol/L (ref 3.5–5.1)
Sodium: 136 mmol/L (ref 135–145)
Total Bilirubin: 0.5 mg/dL (ref 0.3–1.2)
Total Protein: 8.2 g/dL — ABNORMAL HIGH (ref 6.5–8.1)

## 2022-01-10 LAB — BLOOD CULTURE ID PANEL (REFLEXED) - BCID2

## 2022-01-10 LAB — CBC WITH DIFFERENTIAL/PLATELET
Abs Immature Granulocytes: 0.03 10*3/uL (ref 0.00–0.07)
Basophils Absolute: 0 10*3/uL (ref 0.0–0.1)
Basophils Relative: 0 %
Eosinophils Absolute: 0.1 10*3/uL (ref 0.0–0.5)
Eosinophils Relative: 1 %
HCT: 34.2 % — ABNORMAL LOW (ref 36.0–46.0)
Hemoglobin: 11.1 g/dL — ABNORMAL LOW (ref 12.0–15.0)
Immature Granulocytes: 0 %
Lymphocytes Relative: 14 %
Lymphs Abs: 1.5 10*3/uL (ref 0.7–4.0)
MCH: 25.9 pg — ABNORMAL LOW (ref 26.0–34.0)
MCHC: 32.5 g/dL (ref 30.0–36.0)
MCV: 79.9 fL — ABNORMAL LOW (ref 80.0–100.0)
Monocytes Absolute: 0.9 10*3/uL (ref 0.1–1.0)
Monocytes Relative: 8 %
Neutro Abs: 8.1 10*3/uL — ABNORMAL HIGH (ref 1.7–7.7)
Neutrophils Relative %: 77 %
Platelets: 330 10*3/uL (ref 150–400)
RBC: 4.28 MIL/uL (ref 3.87–5.11)
RDW: 13.7 % (ref 11.5–15.5)
WBC: 10.7 10*3/uL — ABNORMAL HIGH (ref 4.0–10.5)
nRBC: 0 % (ref 0.0–0.2)

## 2022-01-10 LAB — URINALYSIS, ROUTINE W REFLEX MICROSCOPIC
Bilirubin Urine: NEGATIVE
Glucose, UA: NEGATIVE mg/dL
Ketones, ur: NEGATIVE mg/dL
Nitrite: POSITIVE — AB
Protein, ur: 30 mg/dL — AB
Specific Gravity, Urine: 1.014 (ref 1.005–1.030)
pH: 6 (ref 5.0–8.0)

## 2022-01-10 LAB — SEDIMENTATION RATE: Sed Rate: 75 mm/hr — ABNORMAL HIGH (ref 0–22)

## 2022-01-10 LAB — I-STAT BETA HCG BLOOD, ED (MC, WL, AP ONLY): I-stat hCG, quantitative: <5 m[IU]/mL (ref <5)

## 2022-01-10 LAB — C-REACTIVE PROTEIN: CRP: 13.2 mg/dL — ABNORMAL HIGH (ref <1.0)

## 2022-01-10 LAB — LACTIC ACID, PLASMA: Lactic Acid, Venous: 1 mmol/L (ref 0.5–1.9)

## 2022-01-10 IMAGING — CT CT RENAL STONE PROTOCOL
2 of 4 series · 15 of 46 positions shown, 17 images · non-contrast
Comparison: No priors.

CLINICAL DATA: 25-year-old female presenting with left-sided
abdominal and flank pain.



[Series 2: axial st · axial · 0.76mm/px · z∈[+1095,+1495]mm · 12 of 92 slices shown, 14 images]
[im 6/92  soft-tissue]
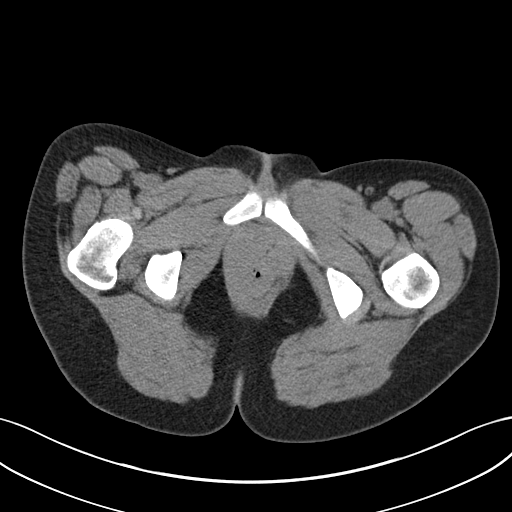
[im 6/92  bone]
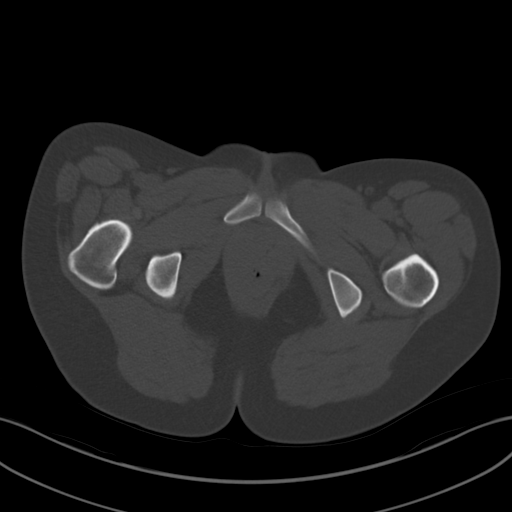
[im 16/92  soft-tissue]
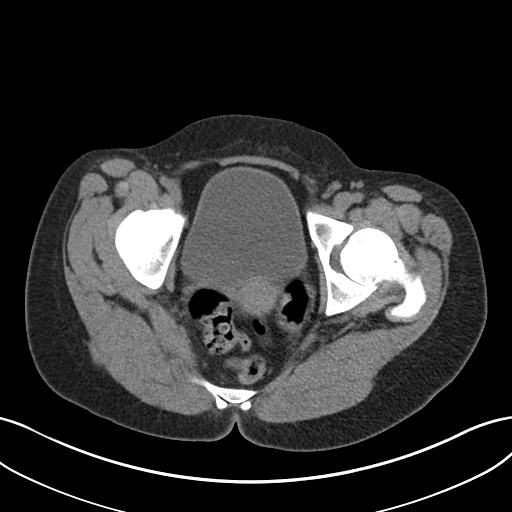
[im 21/92  soft-tissue]
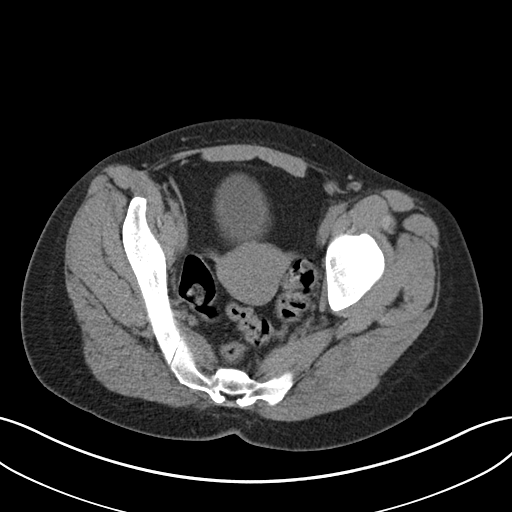
[im 26/92  soft-tissue]
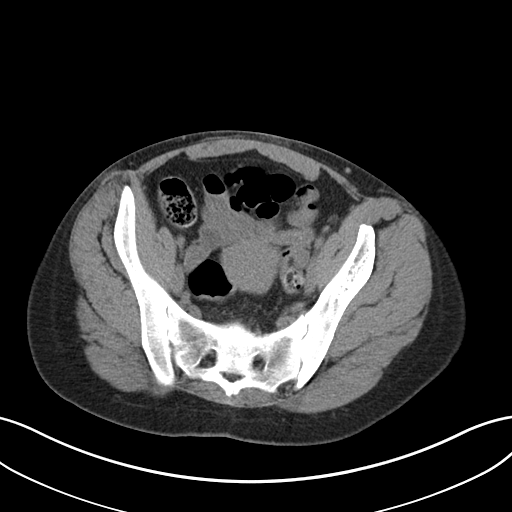
[im 36/92  soft-tissue]
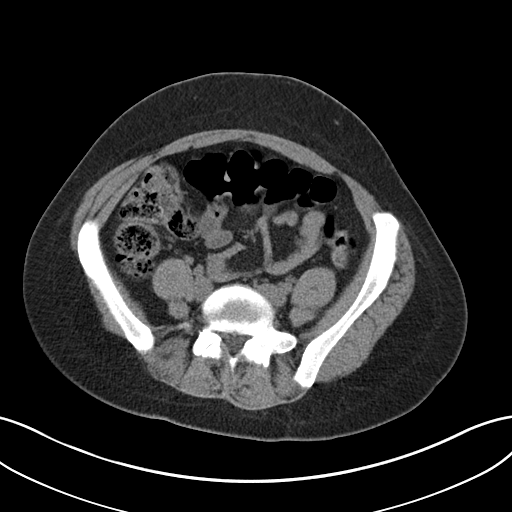
[im 41/92  soft-tissue]
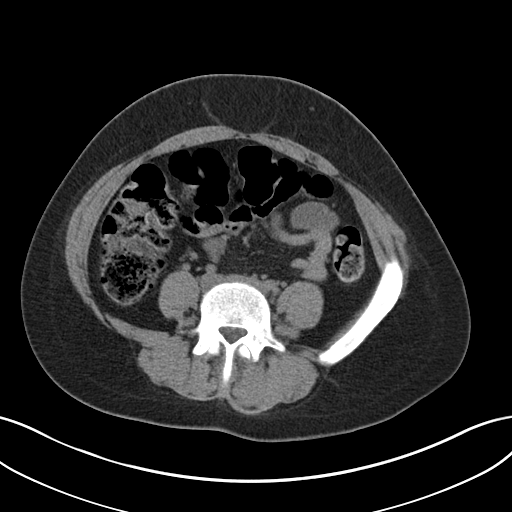
[im 51/92  soft-tissue]
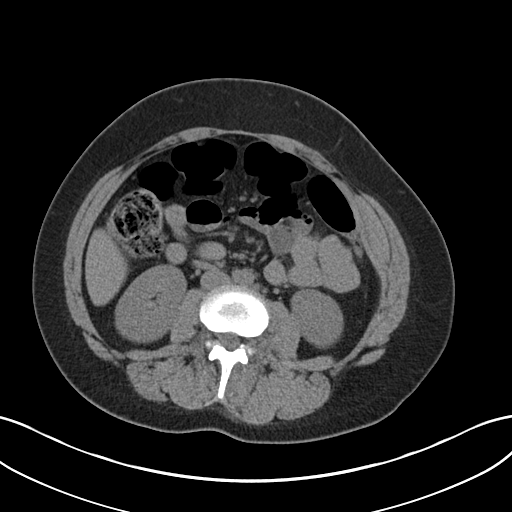
[im 56/92  soft-tissue]
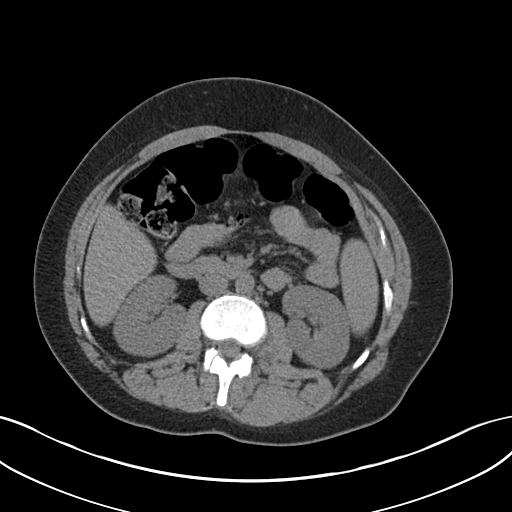
[im 66/92  soft-tissue]
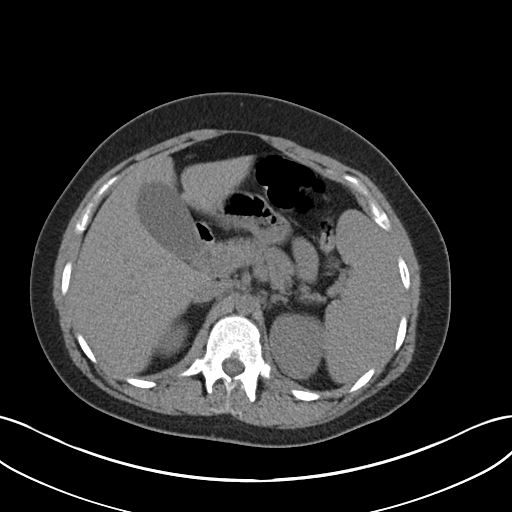
[im 66/92  bone]
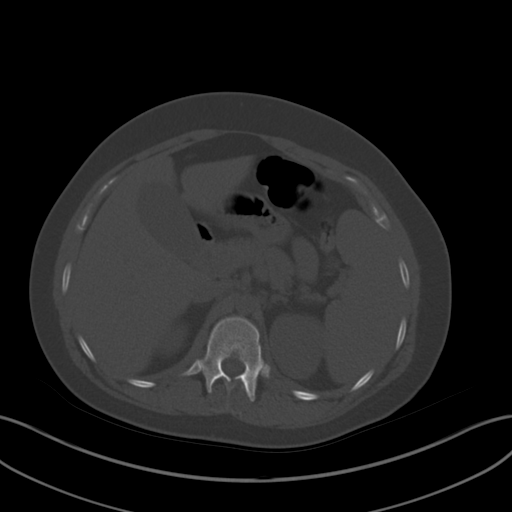
[im 71/92  soft-tissue]
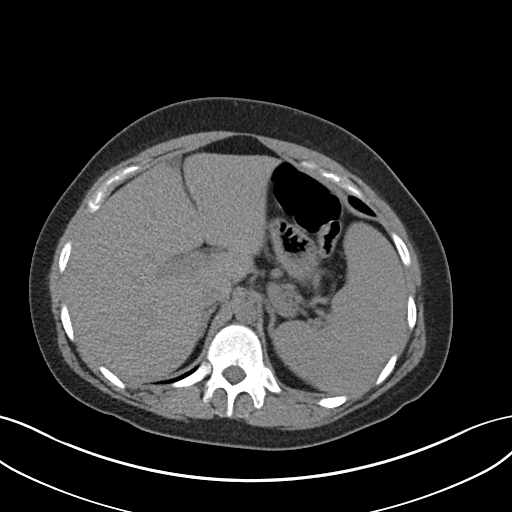
[im 76/92  soft-tissue]
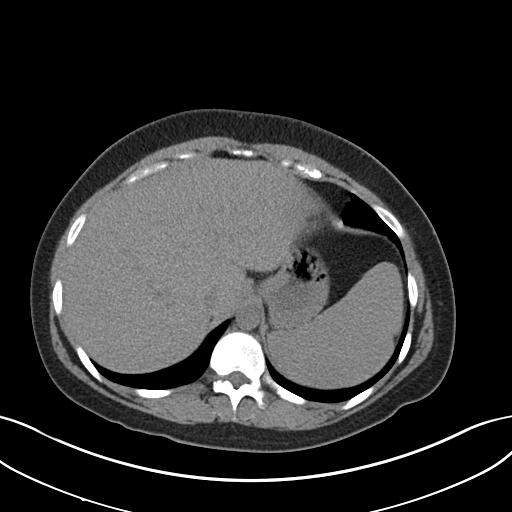
[im 86/92  soft-tissue]
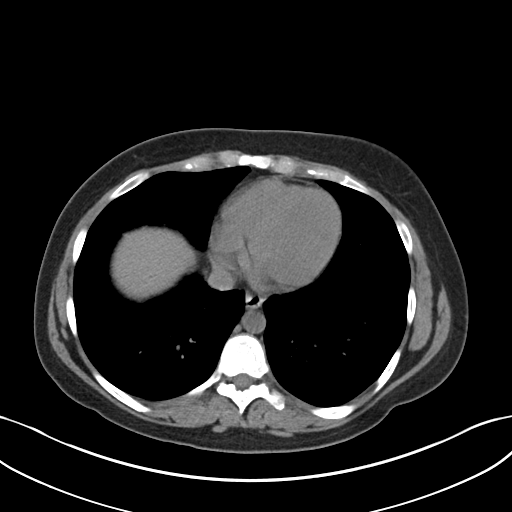

[Series 5: coronal · coronal · 0.72mm/px · 3 of 149 slices shown]
[im 50/149  soft-tissue]
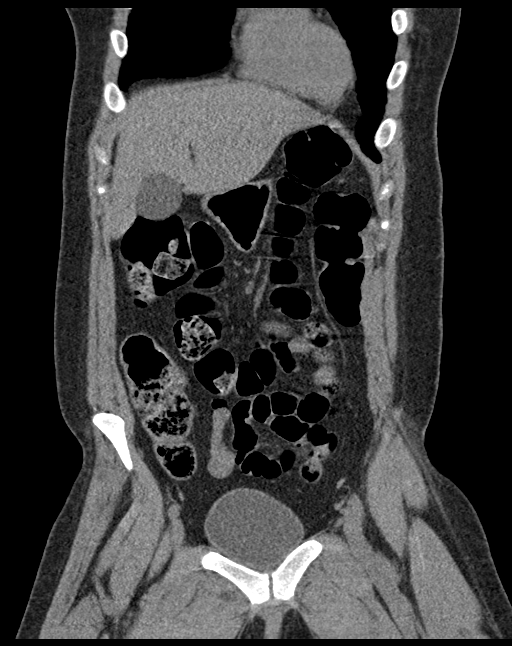
[im 66/149  soft-tissue]
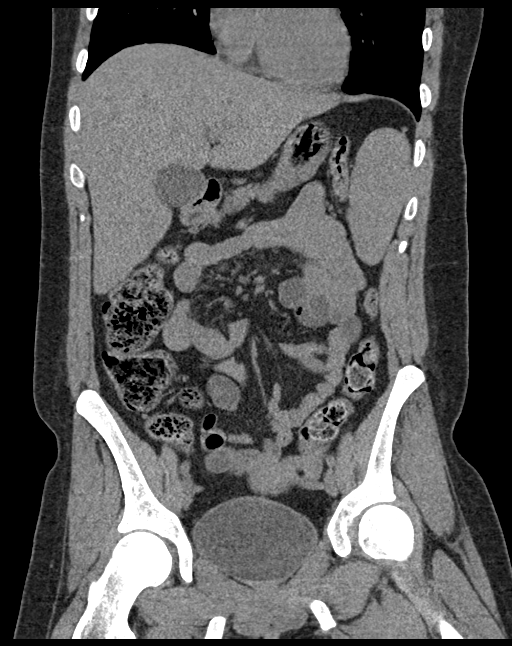
[im 83/149  soft-tissue]
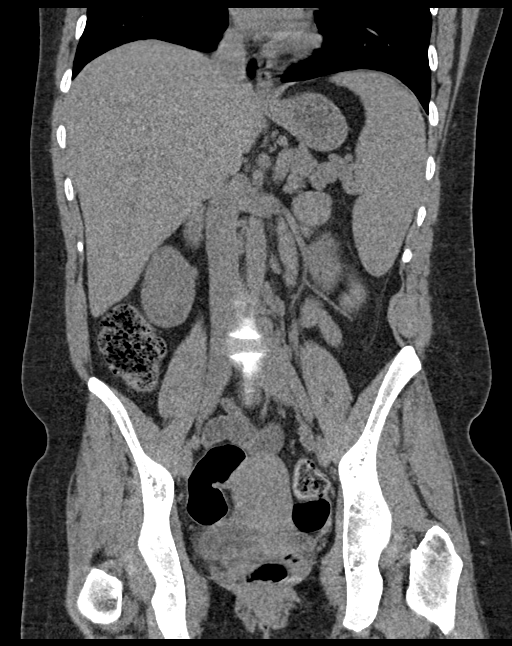

[15 of 46 positions shown; findings below may reference images not displayed]

FINDINGS: Lower chest: Unremarkable.

Hepatobiliary: Area of low attenuation adjacent to the falciform
ligament within the liver, likely focal fatty infiltration. No other
definite suspicious cystic or solid hepatic lesions are confidently
identified on today's noncontrast CT examination. Unenhanced
appearance of the gallbladder is normal.

Pancreas: No definite pancreatic mass or peripancreatic fluid
collections or inflammatory changes are noted on today's noncontrast
CT examination.

Spleen: Spleen is enlarged measuring 13.6 x 6.5 x 14.6 cm (estimated
splenic volume of 645 mL) .

Adrenals/Urinary Tract: There are no abnormal calcifications within
the collecting system of either kidney, along the course of either
ureter, or within the lumen of the urinary bladder. No
hydroureteronephrosis or perinephric stranding to suggest urinary
tract obstruction at this time. The unenhanced appearance of the
kidneys is unremarkable bilaterally. Unenhanced appearance of the
urinary bladder is normal. Bilateral adrenal glands are normal in
appearance.

Stomach/Bowel: Unenhanced appearance of the stomach is normal. No
pathologic dilatation of small bowel or colon. The appendix is not
confidently identified and may be surgically absent. Regardless,
there are no inflammatory changes noted adjacent to the cecum to
suggest the presence of an acute appendicitis at this time.

Vascular/Lymphatic: No atherosclerotic calcifications are noted in
the abdominal aorta or pelvic vasculature. No lymphadenopathy noted
in the abdomen or pelvis.

Reproductive: Unenhanced appearance of the uterus and ovaries is
unremarkable.

Other: No significant volume of ascites.  No pneumoperitoneum.

Musculoskeletal: There are no aggressive appearing lytic or blastic
lesions noted in the visualized portions of the skeleton.
IMPRESSION: 1. No acute findings are noted in the abdomen or pelvis to account
for the patient's symptoms.
2. Splenomegaly.

## 2022-01-10 IMAGING — MR MR LUMBAR SPINE WO/W CM
5 of 7 series · 30 of 48 positions shown · IV contrast (with contrast)
Comparison: None Available.

CLINICAL DATA: Lower back pain with infection suspected. IV drug
abuse

EXAM:
MRI THORACIC AND LUMBAR SPINE WITHOUT AND WITH CONTRAST
TECHNIQUE: Multiplanar and multiecho pulse sequences of the thoracic and lumbar
spine were obtained without and with intravenous contrast.
CONTRAST:  7mL GADAVIST GADOBUTROL 1 MMOL/ML IV SOLN

[Series 5: T1 · sagittal · 4.0mm · 0.81mm/px · 3 of 17 slices shown (1 of 2)]
[im 1/17]
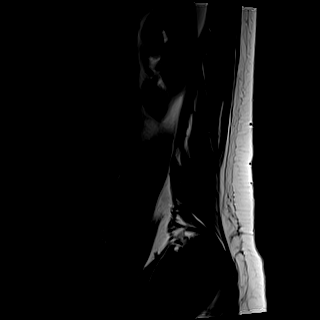
[im 9/17]
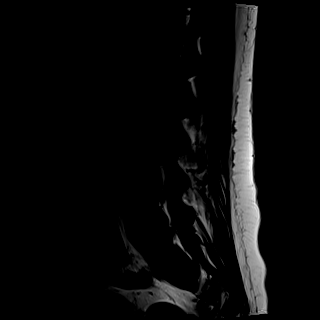
[im 17/17]
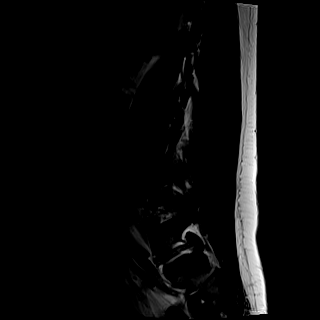

[Series 7: T2 · axial · 4.0mm · 0.62mm/px · z∈[-545,-328]mm · 11 of 45 slices shown]
[im 1/45]
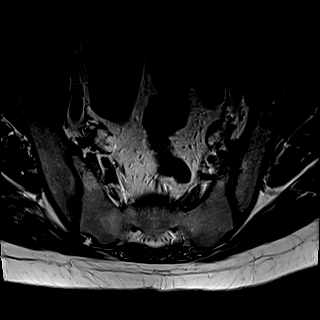
[im 5/45]
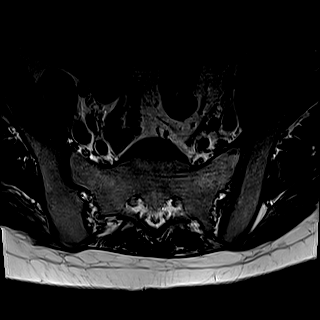
[im 9/45]
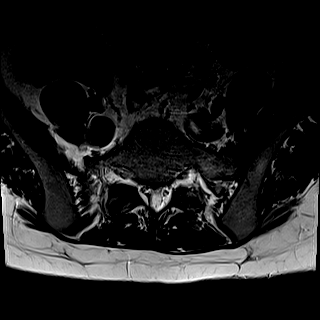
[im 14/45]
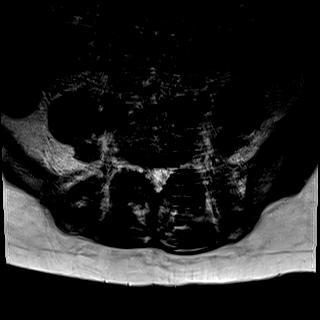
[im 18/45]
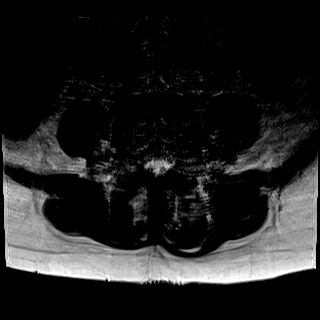
[im 23/45]
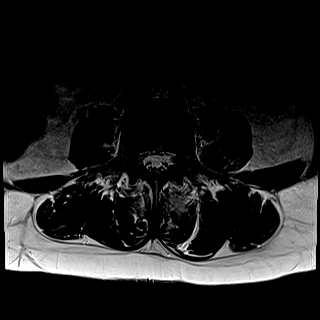
[im 27/45]
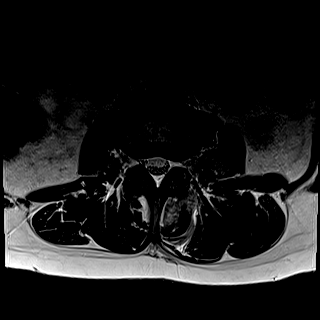
[im 31/45]
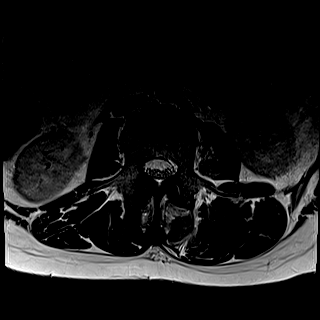
[im 36/45]
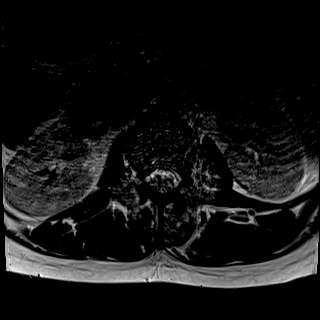
[im 40/45]
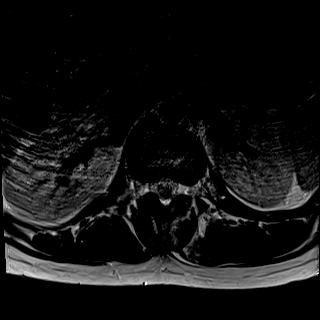
[im 45/45]
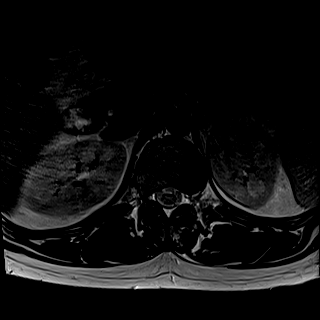

[Series 8: T1 · axial · 4.0mm · 0.39mm/px · z∈[-545,-328]mm · 11 of 45 slices shown (2 of 2)]
[im 1/45]
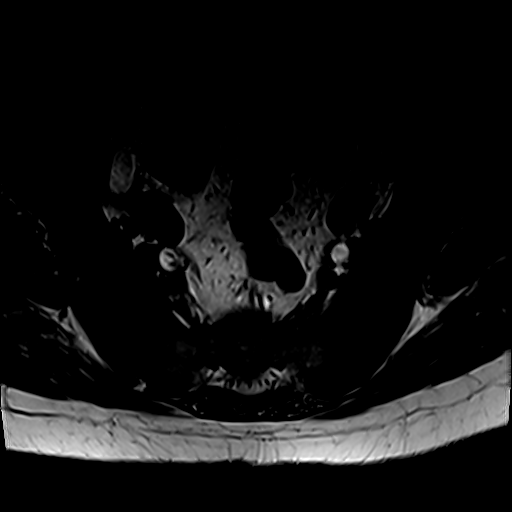
[im 5/45]
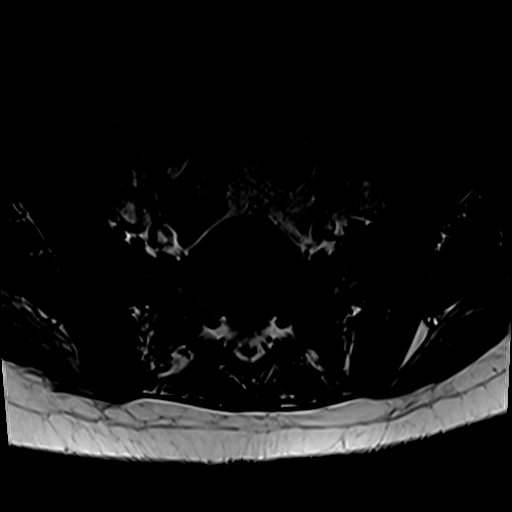
[im 9/45]
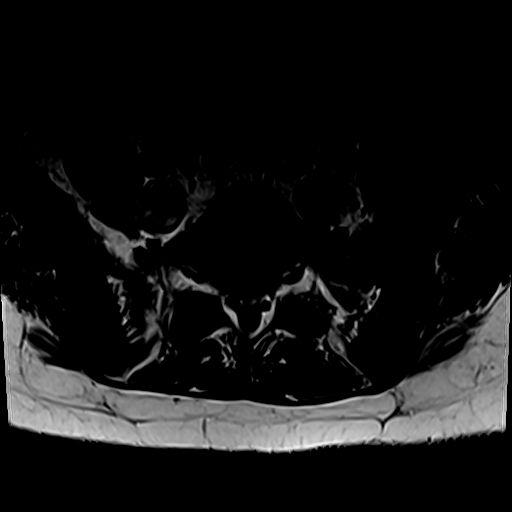
[im 14/45]
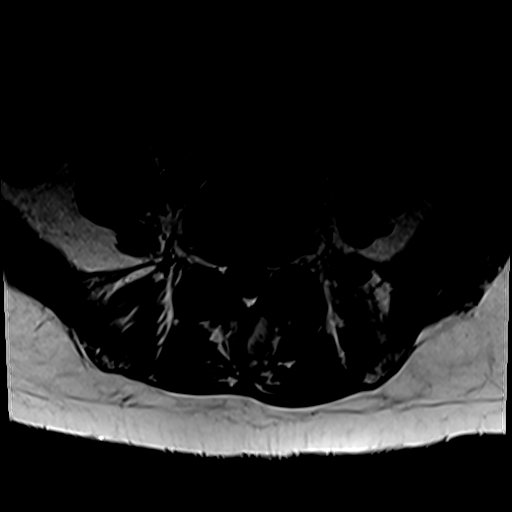
[im 18/45]
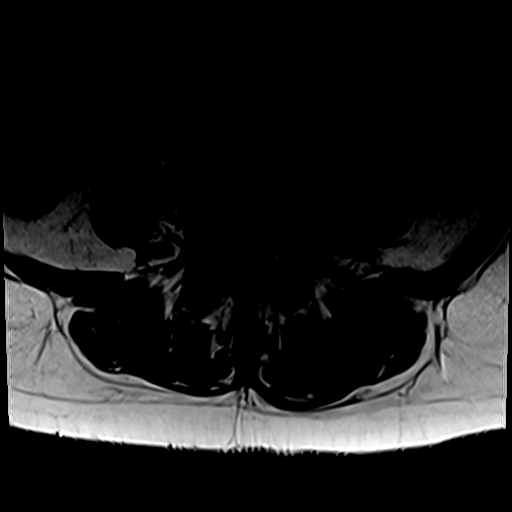
[im 23/45]
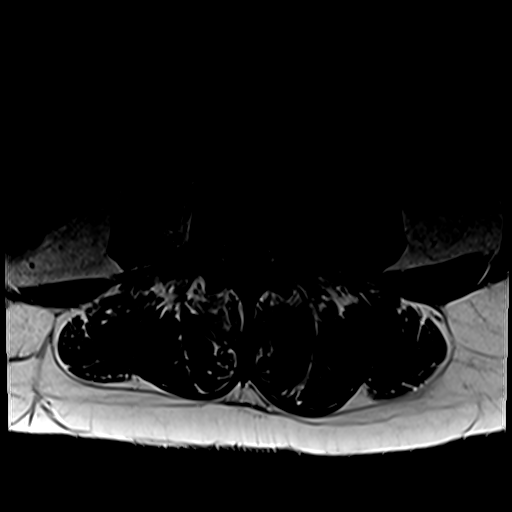
[im 27/45]
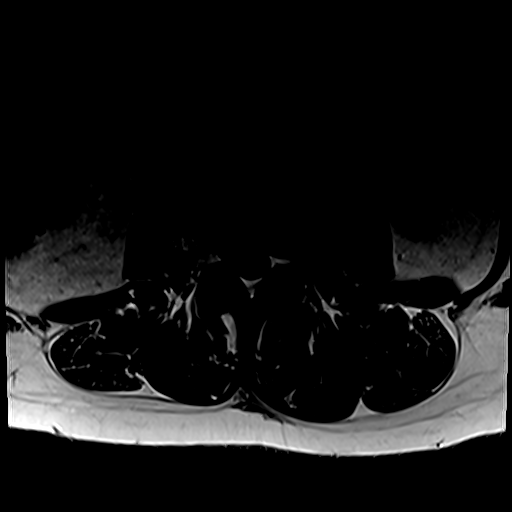
[im 31/45]
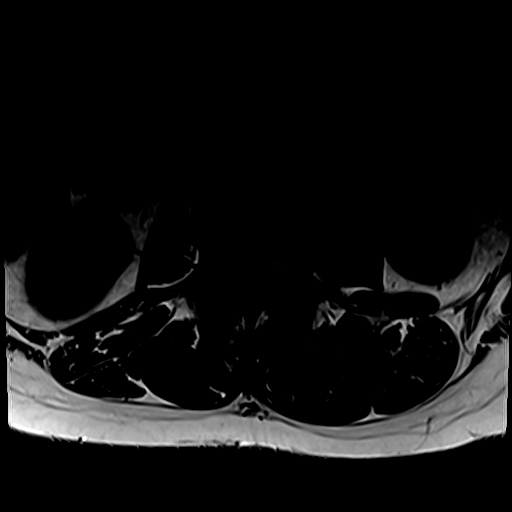
[im 36/45]
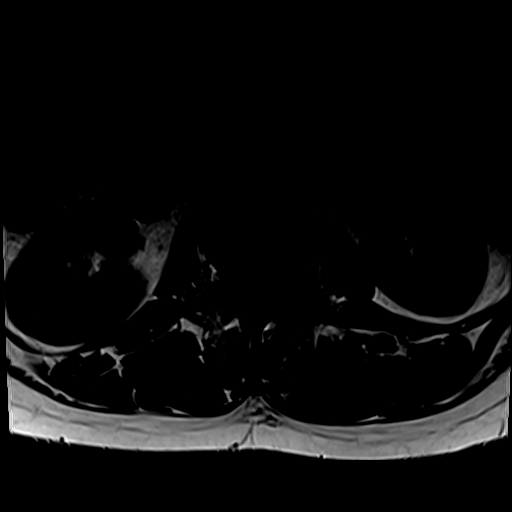
[im 40/45]
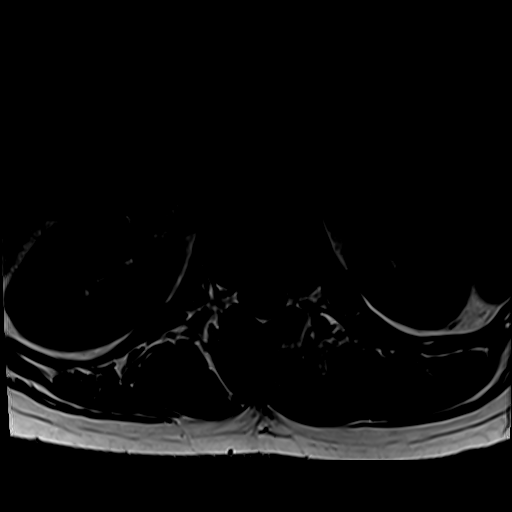
[im 45/45]
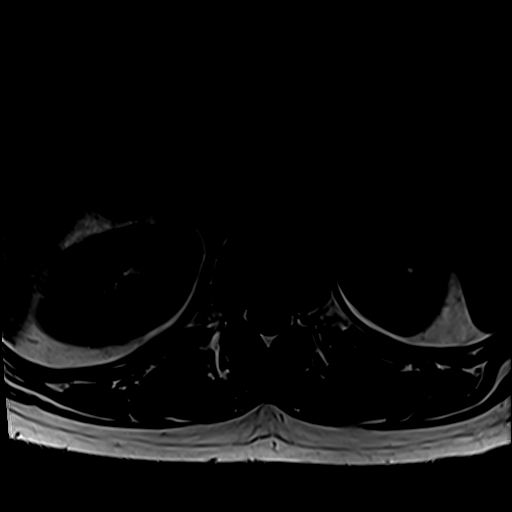

[Series 9: T2 post-contrast · sagittal · 4.0mm · 0.81mm/px · 4 of 17 slices shown]
[im 1/17]
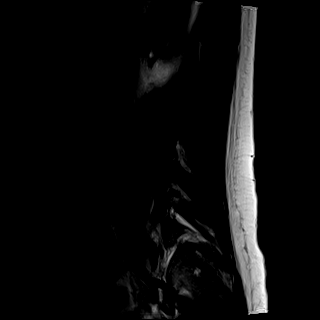
[im 6/17]
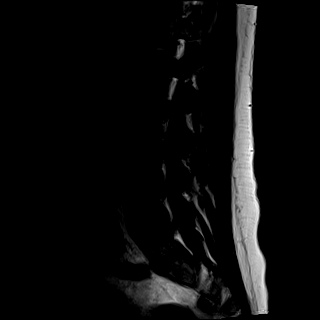
[im 11/17]
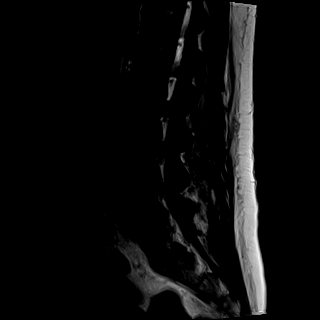
[im 17/17]
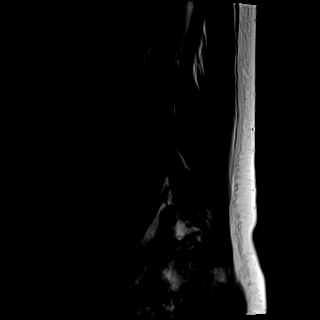

[Series 10: T1 fat-sat post-contrast · sagittal · 4.0mm · 0.81mm/px · 1 of 17 slices shown]
[im 1/17]
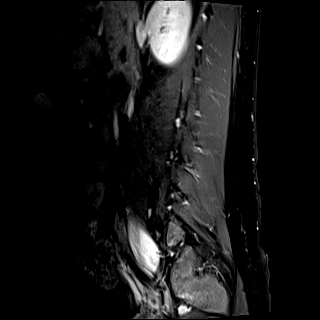

[30 of 48 positions shown; findings below may reference images not displayed]

FINDINGS: MRI THORACIC SPINE FINDINGS

Alignment:  Physiologic.

Vertebrae: No fracture, evidence of discitis, or bone lesion.

Cord:  Normal signal and morphology.

Paraspinal and other soft tissues: Negative.

Disc levels:

Diffusely preserved disc height and hydration. No herniation or
impingement

MRI LUMBAR SPINE FINDINGS

Segmentation: Counting sequence on thoracic spine study is
nondiagnostic due to motion. Based on the upper most ribs there are
6 lumbar type vertebrae, the lowest numbered S1.

Alignment:  Normal

Vertebrae: Mild marrow edema around the left L2-3 facet with
extensive regional muscular T2 hyperintensity and enhancement. On
postcontrast imaging there is collection posterior to the left L3
articular process measuring 10 x 15 mm.

Conus medullaris: Extends to the L2-3 level and appears normal.

Paraspinal and other soft tissues: Left-sided myositis and
periarticular collection as noted above.

Disc levels:

No degenerative changes or impingement
IMPRESSION: Thoracic spine:

Negative.

Lumbar spine:

1. When numbered from above there is a transitional S1 vertebra.
2. Left-sided intrinsic back muscles myositis with 10 x 15 mm
collection posterior to the left L3 articular process. Suspect
septic arthritis at L2-3.

## 2022-01-10 IMAGING — MR MR THORACIC SPINE WO/W CM
7 of 9 series · 38 of 48 positions shown · IV contrast (7ml GADAVIST)
Comparison: None Available.

CLINICAL DATA: Lower back pain with infection suspected. IV drug
abuse

EXAM:
MRI THORACIC AND LUMBAR SPINE WITHOUT AND WITH CONTRAST
TECHNIQUE: Multiplanar and multiecho pulse sequences of the thoracic and lumbar
spine were obtained without and with intravenous contrast.
CONTRAST:  7mL GADAVIST GADOBUTROL 1 MMOL/ML IV SOLN

[Series 16: T1 · sagittal · 4.0mm · 1.72mm/px · 5 of 15 slices shown (1 of 3)]
[im 1/15]
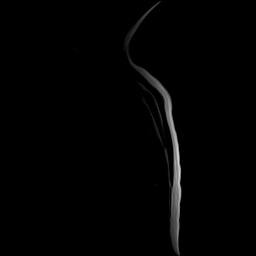
[im 4/15]
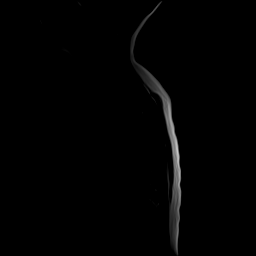
[im 8/15]
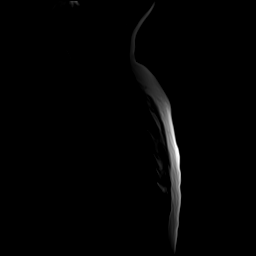
[im 11/15]
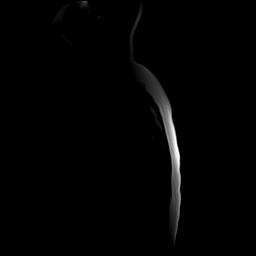
[im 15/15]
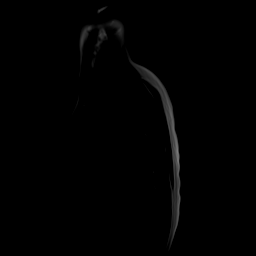

[Series 18: STIR · sagittal · 3.0mm · 1.00mm/px · 6 of 18 slices shown]
[im 1/18]
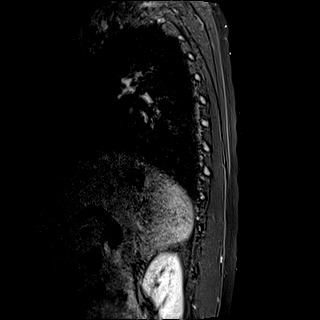
[im 4/18]
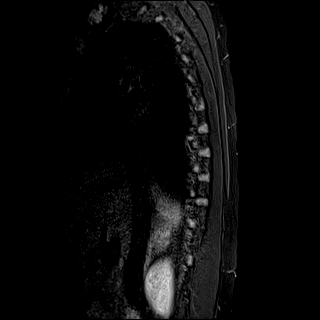
[im 7/18]
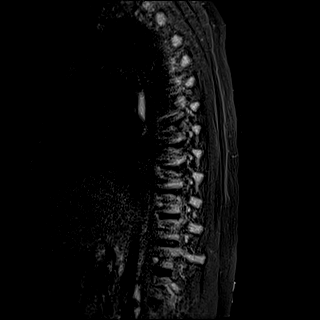
[im 11/18]
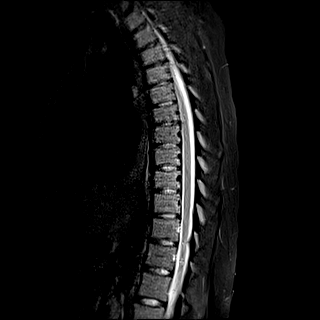
[im 14/18]
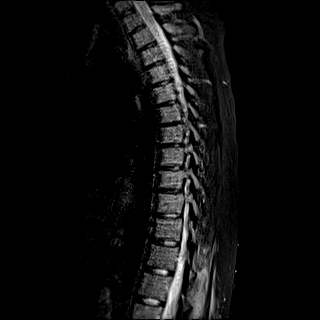
[im 18/18]
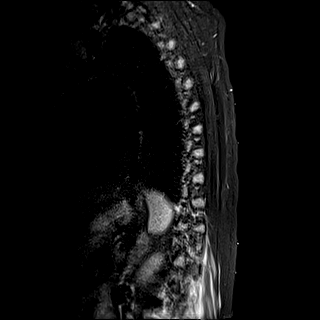

[Series 19: T1 · sagittal · 3.0mm · 1.00mm/px · 5 of 15 slices shown (2 of 3)]
[im 1/15]
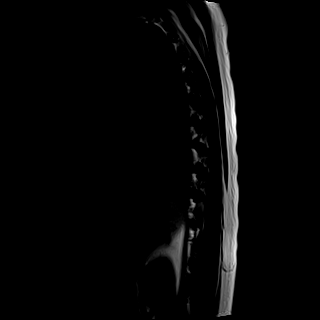
[im 4/15]
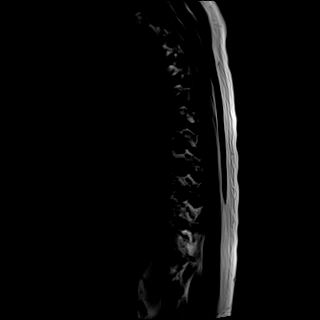
[im 8/15]
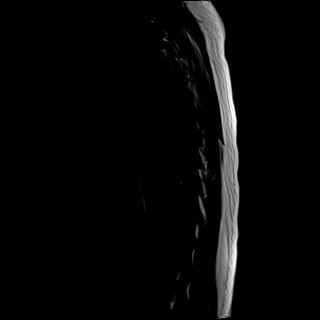
[im 11/15]
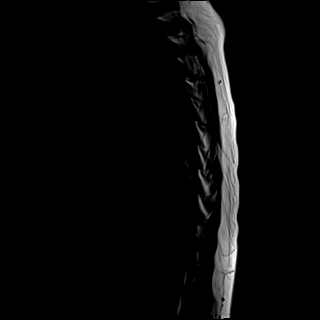
[im 15/15]
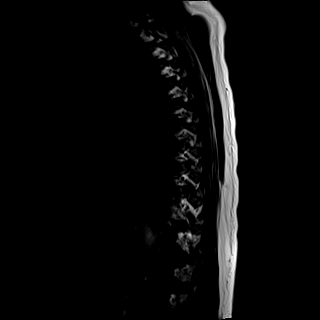

[Series 20: T2 · axial · 4.0mm · 0.78mm/px · z∈[-295,-83]mm · 5 of 15 slices shown]
[im 1/15]
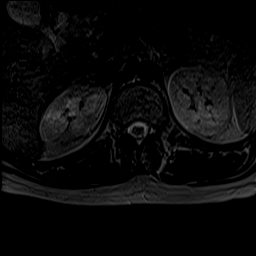
[im 4/15]
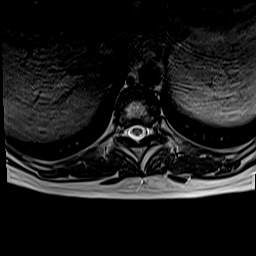
[im 8/15]
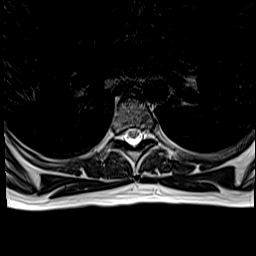
[im 11/15]
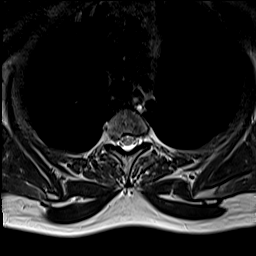
[im 15/15]
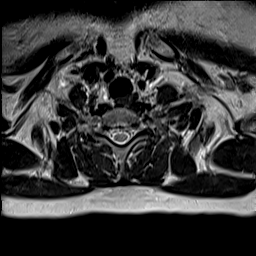

[Series 22: T1 · axial · 4.0mm · 0.39mm/px · z∈[-295,-83]mm · 5 of 15 slices shown (3 of 3)]
[im 1/15]
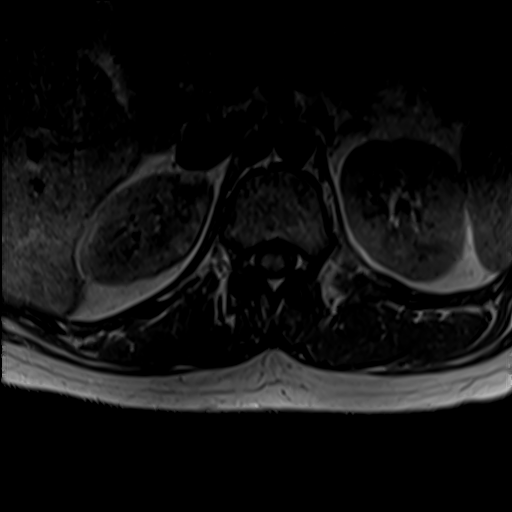
[im 4/15]
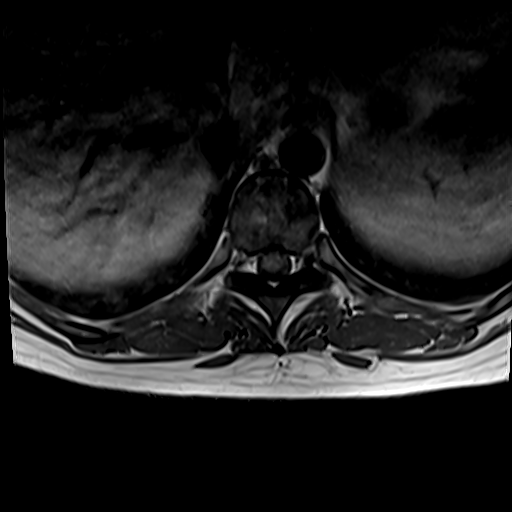
[im 8/15]
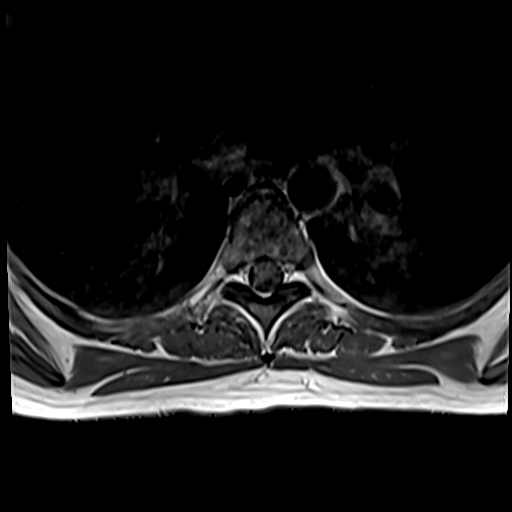
[im 11/15]
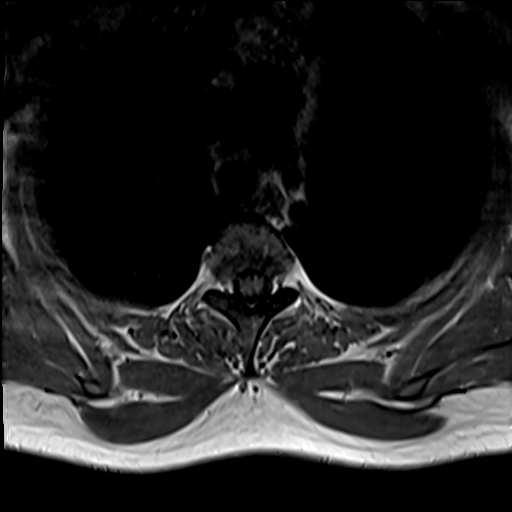
[im 15/15]
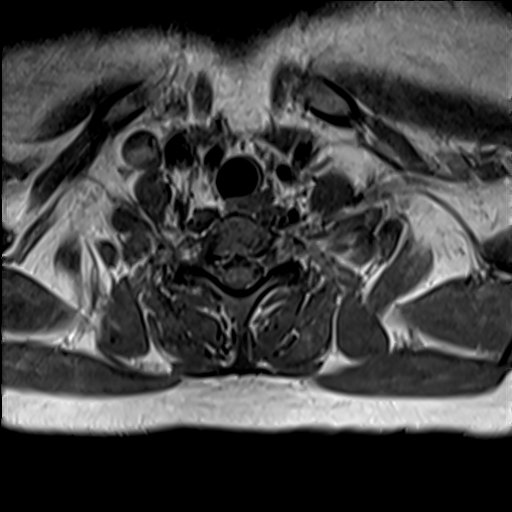

[Series 23: T2 post-contrast · sagittal · 3.0mm · 0.83mm/px · 6 of 18 slices shown]
[im 1/18]
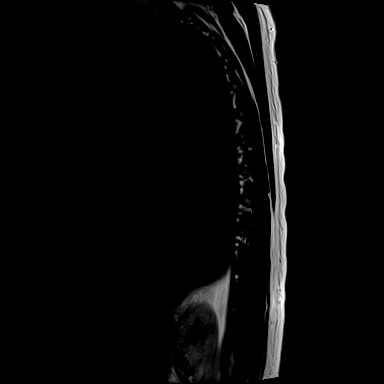
[im 4/18]
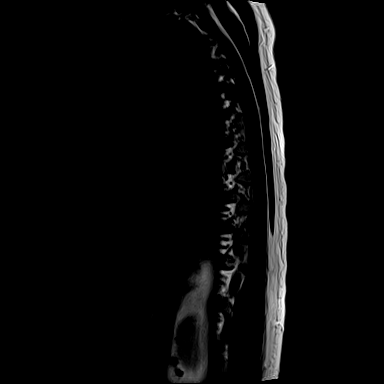
[im 7/18]
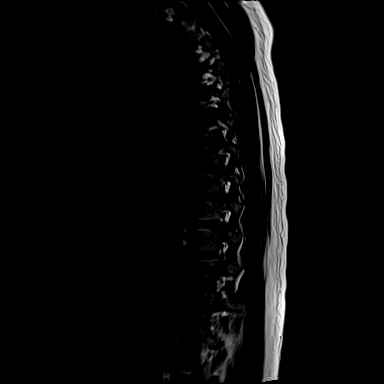
[im 11/18]
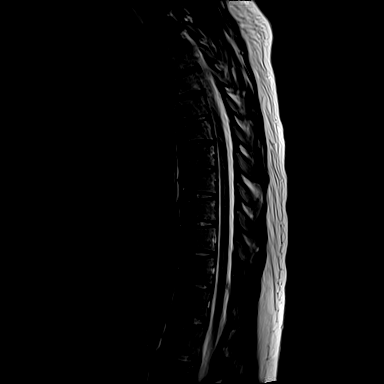
[im 14/18]
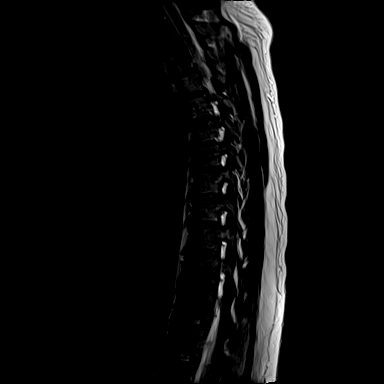
[im 18/18]
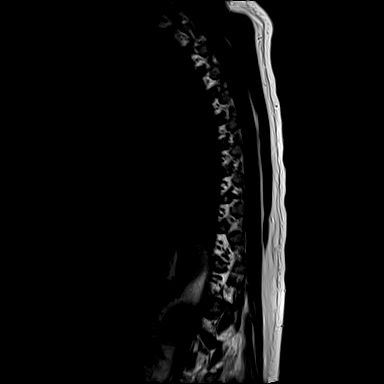

[Series 24: T1 fat-sat post-contrast · sagittal · 3.0mm · 1.00mm/px · 6 of 18 slices shown]
[im 1/18]
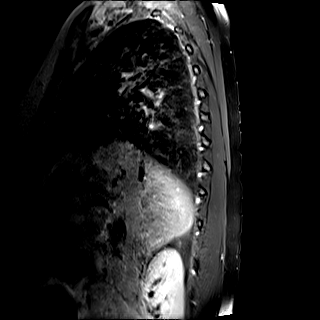
[im 4/18]
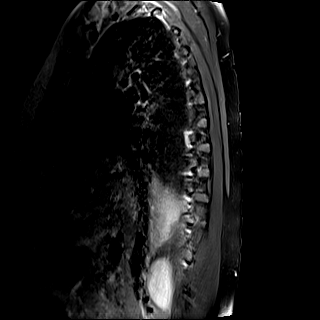
[im 7/18]
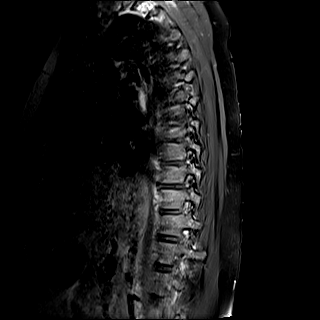
[im 11/18]
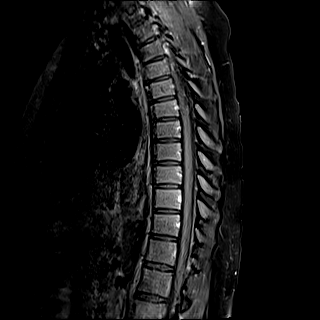
[im 14/18]
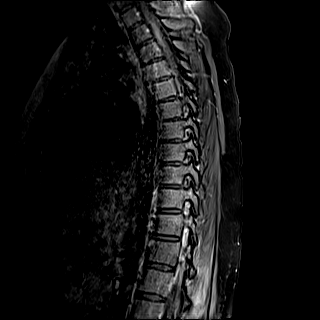
[im 18/18]
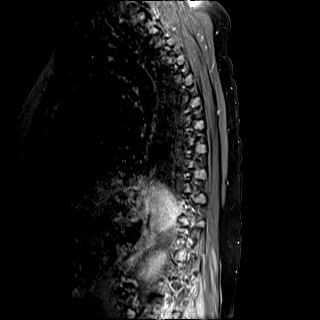

[38 of 48 positions shown; findings below may reference images not displayed]

FINDINGS: MRI THORACIC SPINE FINDINGS

Alignment:  Physiologic.

Vertebrae: No fracture, evidence of discitis, or bone lesion.

Cord:  Normal signal and morphology.

Paraspinal and other soft tissues: Negative.

Disc levels:

Diffusely preserved disc height and hydration. No herniation or
impingement

MRI LUMBAR SPINE FINDINGS

Segmentation: Counting sequence on thoracic spine study is
nondiagnostic due to motion. Based on the upper most ribs there are
6 lumbar type vertebrae, the lowest numbered S1.

Alignment:  Normal

Vertebrae: Mild marrow edema around the left L2-3 facet with
extensive regional muscular T2 hyperintensity and enhancement. On
postcontrast imaging there is collection posterior to the left L3
articular process measuring 10 x 15 mm.

Conus medullaris: Extends to the L2-3 level and appears normal.

Paraspinal and other soft tissues: Left-sided myositis and
periarticular collection as noted above.

Disc levels:

No degenerative changes or impingement
IMPRESSION: Thoracic spine:

Negative.

Lumbar spine:

1. When numbered from above there is a transitional S1 vertebra.
2. Left-sided intrinsic back muscles myositis with 10 x 15 mm
collection posterior to the left L3 articular process. Suspect
septic arthritis at L2-3.

## 2022-01-10 MED ORDER — KETOROLAC TROMETHAMINE 15 MG/ML IJ SOLN
15.0000 mg | Freq: Four times a day (QID) | INTRAMUSCULAR | Status: DC | PRN
  Administered 2022-01-10 – 2022-01-11 (×2): 15 mg via INTRAVENOUS
  Filled 2022-01-10 (×2): qty 1

## 2022-01-10 MED ORDER — FENTANYL CITRATE PF 50 MCG/ML IJ SOSY
50.0000 ug | PREFILLED_SYRINGE | INTRAMUSCULAR | Status: DC | PRN
  Administered 2022-01-10 – 2022-01-11 (×5): 50 ug via INTRAVENOUS
  Filled 2022-01-10 (×5): qty 1

## 2022-01-10 MED ORDER — SODIUM CHLORIDE 0.9% FLUSH
3.0000 mL | Freq: Two times a day (BID) | INTRAVENOUS | Status: DC
  Administered 2022-01-10 – 2022-01-24 (×27): 3 mL via INTRAVENOUS

## 2022-01-10 MED ORDER — KETOROLAC TROMETHAMINE 15 MG/ML IJ SOLN
15.0000 mg | Freq: Once | INTRAMUSCULAR | Status: AC
Start: 2022-01-10 — End: 2022-01-10
  Administered 2022-01-10: 15 mg via INTRAVENOUS
  Filled 2022-01-10: qty 1

## 2022-01-10 MED ORDER — ACETAMINOPHEN 650 MG RE SUPP: 650.0000 mg | Freq: Four times a day (QID) | RECTAL | Status: DC | PRN

## 2022-01-10 MED ORDER — HYDROMORPHONE HCL 1 MG/ML IJ SOLN
0.5000 mg | Freq: Once | INTRAMUSCULAR | Status: AC
  Administered 2022-01-10: 0.5 mg via INTRAVENOUS
  Filled 2022-01-10: qty 1

## 2022-01-10 MED ORDER — ACETAMINOPHEN 325 MG PO TABS
650.0000 mg | ORAL_TABLET | Freq: Four times a day (QID) | ORAL | Status: DC | PRN
  Administered 2022-01-11 – 2022-01-17 (×14): 650 mg via ORAL
  Filled 2022-01-10 (×14): qty 2

## 2022-01-10 MED ORDER — HYDROCODONE-ACETAMINOPHEN 5-325 MG PO TABS
1.0000 | ORAL_TABLET | ORAL | Status: DC | PRN
  Administered 2022-01-10 – 2022-01-11 (×4): 2 via ORAL
  Filled 2022-01-10 (×4): qty 2

## 2022-01-10 MED ORDER — SODIUM CHLORIDE 0.9 % IV SOLN
2.0000 g | Freq: Once | INTRAVENOUS | Status: DC
  Filled 2022-01-10: qty 12.5

## 2022-01-10 MED ORDER — METHOCARBAMOL 500 MG PO TABS
500.0000 mg | ORAL_TABLET | Freq: Once | ORAL | Status: AC
  Administered 2022-01-10: 500 mg via ORAL
  Filled 2022-01-10: qty 1

## 2022-01-10 MED ORDER — ONDANSETRON HCL 4 MG/2ML IJ SOLN
4.0000 mg | Freq: Four times a day (QID) | INTRAMUSCULAR | Status: DC | PRN
  Administered 2022-01-11 – 2022-01-19 (×7): 4 mg via INTRAVENOUS
  Filled 2022-01-10 (×8): qty 2

## 2022-01-10 MED ORDER — ONDANSETRON HCL 4 MG/2ML IJ SOLN
4.0000 mg | Freq: Once | INTRAMUSCULAR | Status: AC
  Administered 2022-01-10: 4 mg via INTRAVENOUS
  Filled 2022-01-10: qty 2

## 2022-01-10 MED ORDER — ENOXAPARIN SODIUM 40 MG/0.4ML IJ SOSY
40.0000 mg | PREFILLED_SYRINGE | INTRAMUSCULAR | Status: DC
  Administered 2022-01-10 – 2022-01-11 (×2): 40 mg via SUBCUTANEOUS
  Filled 2022-01-10 (×6): qty 0.4

## 2022-01-10 MED ORDER — KETOROLAC TROMETHAMINE 15 MG/ML IJ SOLN
15.0000 mg | Freq: Once | INTRAMUSCULAR | Status: AC
  Administered 2022-01-10: 15 mg via INTRAVENOUS
  Filled 2022-01-10: qty 1

## 2022-01-10 MED ORDER — SODIUM CHLORIDE 0.9 % IV SOLN
2.0000 g | Freq: Once | INTRAVENOUS | Status: AC
  Administered 2022-01-10: 2 g via INTRAVENOUS

## 2022-01-10 MED ORDER — MORPHINE SULFATE (PF) 4 MG/ML IV SOLN
4.0000 mg | Freq: Once | INTRAVENOUS | Status: AC
  Administered 2022-01-10: 4 mg via INTRAVENOUS
  Filled 2022-01-10: qty 1

## 2022-01-10 MED ORDER — SODIUM CHLORIDE 0.9% FLUSH
3.0000 mL | INTRAVENOUS | Status: DC | PRN
  Administered 2022-01-11 – 2022-01-13 (×2): 3 mL via INTRAVENOUS

## 2022-01-10 MED ORDER — SODIUM CHLORIDE 0.9 % IV SOLN: 250.0000 mL | INTRAVENOUS | Status: DC | PRN

## 2022-01-10 MED ORDER — GADOBUTROL 1 MMOL/ML IV SOLN
7.0000 mL | Freq: Once | INTRAVENOUS | Status: AC | PRN
  Administered 2022-01-10: 7 mL via INTRAVENOUS

## 2022-01-10 MED ORDER — VANCOMYCIN HCL IN DEXTROSE 1-5 GM/200ML-% IV SOLN
1000.0000 mg | Freq: Once | INTRAVENOUS | Status: AC
  Administered 2022-01-10: 1000 mg via INTRAVENOUS
  Filled 2022-01-10: qty 200

## 2022-01-10 MED ORDER — POLYETHYLENE GLYCOL 3350 17 G PO PACK
17.0000 g | PACK | Freq: Every day | ORAL | Status: DC | PRN
Start: 2022-01-10 — End: 2022-01-24

## 2022-01-10 MED ORDER — ONDANSETRON HCL 4 MG PO TABS
4.0000 mg | ORAL_TABLET | Freq: Four times a day (QID) | ORAL | Status: DC | PRN
  Administered 2022-01-12 – 2022-01-21 (×5): 4 mg via ORAL
  Filled 2022-01-10 (×5): qty 1

## 2022-01-10 MED ORDER — LORAZEPAM 0.5 MG PO TABS
0.5000 mg | ORAL_TABLET | Freq: Once | ORAL | Status: AC | PRN
  Administered 2022-01-10: 0.5 mg via ORAL
  Filled 2022-01-10: qty 1

## 2022-01-10 MED ORDER — LACTATED RINGERS IV BOLUS
1000.0000 mL | Freq: Once | INTRAVENOUS | Status: AC
  Administered 2022-01-10: 1000 mL via INTRAVENOUS

## 2022-01-10 MED ORDER — METHOCARBAMOL 500 MG PO TABS
500.0000 mg | ORAL_TABLET | Freq: Three times a day (TID) | ORAL | Status: DC | PRN
Start: 2022-01-10 — End: 2022-01-12
  Administered 2022-01-10 – 2022-01-12 (×5): 500 mg via ORAL
  Filled 2022-01-10 (×5): qty 1

## 2022-01-10 NOTE — Progress Notes (Signed)
Pt crying out for pain meds. Told pt twice that it isn't time for pain meds.

## 2022-01-10 NOTE — Plan of Care (Signed)

## 2022-01-10 NOTE — H&P (Signed)
History and Physical    Krystal Valdez YKD:[id number] DOB: 08-17-1995 DOA: 01/10/2022  PCP: Pcp, No  Patient coming from: Home   Chief Complaint: Left back pain  HPI: Krystal Valdez is a 26 y.o. female with medical history significant of drug abuse, mood disorder who is not on any medications as outpatient currently who presented with 3-day history of lumbar back pain.  She admitted to subjective fevers and chills, no relief of pain at home using Tylenol, Benadryl, heroin and heat therapy.  Also admitted to some nausea.  Pain radiating from left low back to her abdomen.  ED Course: WBC 10.7, ESR 75, CRP 13.2, lactic acid 1.  MRI thoracic and lumbar spine revealed likely septic arthritis at L2-L3, with a 10 x 15 mm fluid collection.  Patient given vancomycin, cefepime.  Consulted with neurosurgery and did not believe there was any acute need for surgical intervention.  Recommended ID consult, possible IR for aspiration.  Patient admitting to [facility].  Review of Systems: As per HPI. Otherwise, all other review of systems reviewed and are negative.   Past Medical History:  Diagnosis Date   Allergy    Asthma    Asthma    Left breast abscess 04/02/2013   Will change antibiotics to septra Ds and refer to Dr Arnoldo Morale 8/21 at 11 am   Mastitis, left, acute 03/28/2013   rx augmentin.   Pneumonia    UTI (lower urinary tract infection)     Past Surgical History:  Procedure Laterality Date   INCISION AND DRAINAGE ABSCESS Left 04/05/2013   Procedure: INCISION AND DRAINAGE LEFT BREAST ABSCESS;  Surgeon: Jamesetta So, MD;  Location: [facility];  Service: General;  Laterality: Left;   MYRINGOTOMY     bilateral, x 2   NO PAST SURGERIES       reports that she has been smoking cigarettes. She has a 4.00 pack-year smoking history. She has never used smokeless tobacco. She reports current drug use. Drug: IV. She reports that she does not drink alcohol.  No Known Allergies  Family History   Problem Relation Age of Onset   Hypertension Other    Diabetes Other    Cancer Other        hx breast,colon,cervical cancer   Hypertension Father    Hyperlipidemia Father    Diabetes Paternal Grandfather     Prior to Admission medications   Medication Sig Start Date End Date Taking? Authorizing Provider  albuterol (PROVENTIL HFA;VENTOLIN HFA) 108 (90 Base) MCG/ACT inhaler Inhale 1-2 puffs into the lungs every 6 (six) hours as needed for wheezing or shortness of breath. 06/29/16   Kandra Nicolas, MD  guaiFENesin-codeine 100-10 MG/5ML syrup Take 63m by mouth at bedtime as needed for cough 06/29/16   BKandra Nicolas MD  HYDROcodone-acetaminophen (NORCO/VICODIN) 5-325 MG tablet Take 1-2 tablets by mouth every 6 (six) hours as needed for moderate pain or severe pain. 08/04/16   PNoe Gens PA-C  ibuprofen (ADVIL,MOTRIN) 600 MG tablet Take 1 tablet (600 mg total) by mouth every 6 (six) hours as needed. 08/04/16   PNoe Gens PA-C  levofloxacin (LEVAQUIN) 500 MG tablet Take 1 tablet (500 mg total) by mouth daily. 06/29/16   BKandra Nicolas MD  predniSONE (DELTASONE) 20 MG tablet Take one tab by mouth twice daily for 4 days, then one daily for 3 days. Take with food. 06/29/16   BKandra Nicolas MD    Physical Exam: Vitals:  01/10/22 0617 01/10/22 0715 01/10/22 0730 01/10/22 1111  BP: (!) 101/92 108/77 120/87 (!) 118/56  Pulse: 100 100 93 (!) 103  Resp: _0 Temp:      TempSrc:      SpO2: 98% 97% 100% 99%     Constitutional: In pain and distress, writhing in bed Eyes: PERRL, lids and conjunctivae normal Respiratory: Clear to auscultation bilaterally, no wheezing, no crackles. Normal respiratory effort. No accessory muscle use.  Cardiovascular: Regular rate and rhythm, no murmurs. No extremity edema.  Abdomen: Soft, nondistended, nontender to palpation.  Musculoskeletal: No joint deformity upper and lower extremities. No contractures. Normal muscle tone.  Skin:  no rashes, lesions, ulcers on exposed skin  Neurologic: Alert and oriented, speech fluent  Labs on Admission: I have personally reviewed following labs and imaging studies  CBC: Recent Labs  Lab 01/10/22 0512  WBC 10.7*  NEUTROABS 8.1*  HGB 11.1*  HCT 34.2*  MCV 79.9*  PLT 222   Basic Metabolic Panel: Recent Labs  Lab 01/10/22 0512  NA 136  K 3.8  CL 100  CO2 27  GLUCOSE 94  BUN 12  CREATININE 0.49  CALCIUM 9.2   GFR: CrCl cannot be calculated (Unknown ideal weight.). Liver Function Tests: Recent Labs  Lab 01/10/22 0512  AST 14*  ALT 13  ALKPHOS 71  BILITOT 0.5  PROT 8.2*  ALBUMIN 3.8   No results for input(s): LIPASE, AMYLASE in the last 168 hours. No results for input(s): AMMONIA in the last 168 hours. Coagulation Profile: No results for input(s): INR, PROTIME in the last 168 hours. Cardiac Enzymes: No results for input(s): CKTOTAL, CKMB, CKMBINDEX, TROPONINI in the last 168 hours. BNP (last 3 results) No results for input(s): PROBNP in the last 8760 hours. HbA1C: No results for input(s): HGBA1C in the last 72 hours. CBG: No results for input(s): GLUCAP in the last 168 hours. Lipid Profile: No results for input(s): CHOL, HDL, LDLCALC, TRIG, CHOLHDL, LDLDIRECT in the last 72 hours. Thyroid Function Tests: No results for input(s): TSH, T4TOTAL, FREET4, T3FREE, THYROIDAB in the last 72 hours. Anemia Panel: No results for input(s): VITAMINB12, FOLATE, FERRITIN, TIBC, IRON, RETICCTPCT in the last 72 hours. Urine analysis:    Component Value Date/Time   COLORURINE YELLOW 01/10/2022 0640   APPEARANCEUR HAZY (A) 01/10/2022 0640   LABSPEC 1.014 01/10/2022 0640   PHURINE 6.0 01/10/2022 0640   GLUCOSEU NEGATIVE 01/10/2022 0640   HGBUR LARGE (A) 01/10/2022 0640   BILIRUBINUR NEGATIVE 01/10/2022 0640   BILIRUBINUR negative 06/29/2016 2020   BILIRUBINUR neg 06/12/2013 1517   KETONESUR NEGATIVE 01/10/2022 0640   PROTEINUR 30 (A) 01/10/2022 0640    UROBILINOGEN 0.2 06/29/2016 2020   UROBILINOGEN 0.2 04/06/2014 2140   NITRITE POSITIVE (A) 01/10/2022 0640   LEUKOCYTESUR TRACE (A) 01/10/2022 0640   Sepsis Labs: !!!!!!!!!!!!!!!!!!!!!!!!!!!!!!!!!!!!!!!!!!!! _1 (procalcitonin:4,lacticidven:4) )No results found for this or any previous visit (from the past 240 hour(s)).   Radiological Exams on Admission: MR THORACIC SPINE W WO CONTRAST  Result Date: 01/10/2022 CLINICAL DATA:  Lower back pain with infection suspected. IV drug abuse EXAM: MRI THORACIC AND LUMBAR SPINE WITHOUT AND WITH CONTRAST TECHNIQUE: Multiplanar and multiecho pulse sequences of the thoracic and lumbar spine were obtained without and with intravenous contrast. CONTRAST:  60m GADAVIST GADOBUTROL 1 MMOL/ML IV SOLN COMPARISON:  None Available. FINDINGS: MRI THORACIC SPINE FINDINGS Alignment:  Physiologic. Vertebrae: No fracture, evidence of discitis, or bone lesion. Cord:  Normal signal and morphology. Paraspinal and other soft  tissues: Negative. Disc levels: Diffusely preserved disc height and hydration. No herniation or impingement MRI LUMBAR SPINE FINDINGS Segmentation: Counting sequence on thoracic spine study is nondiagnostic due to motion. Based on the upper most ribs there are 6 lumbar type vertebrae, the lowest numbered S1. Alignment:  Normal Vertebrae: Mild marrow edema around the left L2-3 facet with extensive regional muscular T2 hyperintensity and enhancement. On postcontrast imaging there is collection posterior to the left L3 articular process measuring 10 x 15 mm. Conus medullaris: Extends to the L2-3 level and appears normal. Paraspinal and other soft tissues: Left-sided myositis and periarticular collection as noted above. Disc levels: No degenerative changes or impingement IMPRESSION: Thoracic spine: Negative. Lumbar spine: 1. When numbered from above there is a transitional S1 vertebra. 2. Left-sided intrinsic back muscles myositis with 10 x 15 mm collection  posterior to the left L3 articular process. Suspect septic arthritis at L2-3. Electronically Signed   By: Jorje Guild M.D.   On: 01/10/2022 11:28   MR Lumbar Spine W Wo Contrast  Result Date: 01/10/2022 CLINICAL DATA:  Lower back pain with infection suspected. IV drug abuse EXAM: MRI THORACIC AND LUMBAR SPINE WITHOUT AND WITH CONTRAST TECHNIQUE: Multiplanar and multiecho pulse sequences of the thoracic and lumbar spine were obtained without and with intravenous contrast. CONTRAST:  82m GADAVIST GADOBUTROL 1 MMOL/ML IV SOLN COMPARISON:  None Available. FINDINGS: MRI THORACIC SPINE FINDINGS Alignment:  Physiologic. Vertebrae: No fracture, evidence of discitis, or bone lesion. Cord:  Normal signal and morphology. Paraspinal and other soft tissues: Negative. Disc levels: Diffusely preserved disc height and hydration. No herniation or impingement MRI LUMBAR SPINE FINDINGS Segmentation: Counting sequence on thoracic spine study is nondiagnostic due to motion. Based on the upper most ribs there are 6 lumbar type vertebrae, the lowest numbered S1. Alignment:  Normal Vertebrae: Mild marrow edema around the left L2-3 facet with extensive regional muscular T2 hyperintensity and enhancement. On postcontrast imaging there is collection posterior to the left L3 articular process measuring 10 x 15 mm. Conus medullaris: Extends to the L2-3 level and appears normal. Paraspinal and other soft tissues: Left-sided myositis and periarticular collection as noted above. Disc levels: No degenerative changes or impingement IMPRESSION: Thoracic spine: Negative. Lumbar spine: 1. When numbered from above there is a transitional S1 vertebra. 2. Left-sided intrinsic back muscles myositis with 10 x 15 mm collection posterior to the left L3 articular process. Suspect septic arthritis at L2-3. Electronically Signed   By: JJorje GuildM.D.   On: 01/10/2022 11:28   CT Renal Stone Study  Result Date: 01/10/2022 CLINICAL DATA:   26year old female presenting with left-sided abdominal and flank pain. EXAM: CT ABDOMEN AND PELVIS WITHOUT CONTRAST TECHNIQUE: Multidetector CT imaging of the abdomen and pelvis was performed following the standard protocol without IV contrast. RADIATION DOSE REDUCTION: This exam was performed according to the departmental dose-optimization program which includes automated exposure control, adjustment of the mA and/or kV according to patient size and/or use of iterative reconstruction technique. COMPARISON:  No priors. FINDINGS: Lower chest: Unremarkable. Hepatobiliary: Area of low attenuation adjacent to the falciform ligament within the liver, likely focal fatty infiltration. No other definite suspicious cystic or solid hepatic lesions are confidently identified on today's noncontrast CT examination. Unenhanced appearance of the gallbladder is normal. Pancreas: No definite pancreatic mass or peripancreatic fluid collections or inflammatory changes are noted on today's noncontrast CT examination. Spleen: Spleen is enlarged measuring 13.6 x 6.5 x 14.6 cm (estimated splenic volume of 645 mL) .  Adrenals/Urinary Tract: There are no abnormal calcifications within the collecting system of either kidney, along the course of either ureter, or within the lumen of the urinary bladder. No hydroureteronephrosis or perinephric stranding to suggest urinary tract obstruction at this time. The unenhanced appearance of the kidneys is unremarkable bilaterally. Unenhanced appearance of the urinary bladder is normal. Bilateral adrenal glands are normal in appearance. Stomach/Bowel: Unenhanced appearance of the stomach is normal. No pathologic dilatation of small bowel or colon. The appendix is not confidently identified and may be surgically absent. Regardless, there are no inflammatory changes noted adjacent to the cecum to suggest the presence of an acute appendicitis at this time. Vascular/Lymphatic: No atherosclerotic  calcifications are noted in the abdominal aorta or pelvic vasculature. No lymphadenopathy noted in the abdomen or pelvis. Reproductive: Unenhanced appearance of the uterus and ovaries is unremarkable. Other: No significant volume of ascites.  No pneumoperitoneum. Musculoskeletal: There are no aggressive appearing lytic or blastic lesions noted in the visualized portions of the skeleton. IMPRESSION: 1. No acute findings are noted in the abdomen or pelvis to account for the patient's symptoms. 2. Splenomegaly. Electronically Signed   By: Vinnie Langton M.D.   On: 01/10/2022 06:34     Assessment/Plan Principal Problem:   Septic arthritis of lumbar spine (HCC) Active Problems:   Drug abuse and dependence (HCC)   Hematuria   Septic arthritis of lumbar spine  -MRI T-spine/L-spine: Left-sided intrinsic back muscles myositis with 10 x 15 mm collection posterior to the left L3 articular process. Suspect septic arthritis at L2-3. -ED PA discussed with neurosurgery; they did not feel acute surgical intervention necessary -Consult IR for aspiration  -Consult ID for antibiotic recommendation, discussed with Dr. Tommy Medal  -Blood cx pending -Started on vanco/cefepime in ED - hold further dosing until aspiration done   Polysubstance abuse -Admitted to heroin and methamphetamine use   Hematuria  -She is not sure if she's on her period. States her last cycle was sometime last month -Monitor for now    DVT prophylaxis: Lovenox  Code Status: Full  Family Communication: None at bedside  Disposition Plan: Pending work up  [employer] called: Neurosurgery, IR, ID     Status is: Inpatient Remains inpatient appropriate because: IV antibiotics and further work up    Severity of Illness: The appropriate patient status for this patient is INPATIENT. Inpatient status is judged to be reasonable and necessary in order to provide the required intensity of service to ensure the patient's safety. The patient's  presenting symptoms, physical exam findings, and initial radiographic and laboratory data in the context of their chronic comorbidities is felt to place them at high risk for further clinical deterioration. Furthermore, it is not anticipated that the patient will be medically stable for discharge from the hospital within 2 midnights of admission.   * I certify that at the point of admission it is my clinical judgment that the patient will require inpatient hospital care spanning beyond 2 midnights from the point of admission due to high intensity of service, high risk for further deterioration and high frequency of surveillance required.Dessa Phi, DO [facility] 01/10/2022, 5:15 PM   Available via Epic secure chat 7am-7pm After these hours, please refer to coverage provider listed on [email or web address]

## 2022-01-10 NOTE — ED Provider Notes (Signed)
[facility]-EMERGENCY DEPT Provider Note   CSN: [id number] Arrival date & time: 01/10/22  0421     History  Chief Complaint  Patient presents with   Flank Pain    Krystal Valdez is a 26 y.o. female with history of IV drug use with heroin most recently this morning at 930 who presents to the emergency department for 3 days of worsening left-sided back pain with associated hematuria and vaginal bleeding.  She states that she is passing blood even when she is not urinating.  Pain starts in the left back and wraps around the left abdomen though she does endorse pain in the right lower abdomen as well.  She states that her menstrual cycle occurred 1 month ago and that she is reportedly due for her period.  She is sexually active with a female partner but is not on any contraception and does not use barrier protection with condoms.  She states she tried to heating pads, Tylenol, Benadryl, and heroin for pain relief without improvement.  Endorses nausea.  I personally reviewed this patient's medical records.  She is history of urologic procedure as a child, urinary tract infections, and previously stated IV drug use.  Is not currently on any medications daily.  Does endorse heroin use today and methamphetamine use 3 days ago.  Denies any other recreational drug use.  HPI     Home Medications Prior to Admission medications   Medication Sig Start Date End Date Taking? Authorizing Provider  albuterol (PROVENTIL HFA;VENTOLIN HFA) 108 (90 Base) MCG/ACT inhaler Inhale 1-2 puffs into the lungs every 6 (six) hours as needed for wheezing or shortness of breath. 06/29/16   Lattie HawBeese, Stephen A, MD  guaiFENesin-codeine 100-10 MG/5ML syrup Take 10mL by mouth at bedtime as needed for cough 06/29/16   Lattie HawBeese, Stephen A, MD  HYDROcodone-acetaminophen (NORCO/VICODIN) 5-325 MG tablet Take 1-2 tablets by mouth every 6 (six) hours as needed for moderate pain or severe pain. 08/04/16   Lurene ShadowPhelps, Erin  O, PA-C  ibuprofen (ADVIL,MOTRIN) 600 MG tablet Take 1 tablet (600 mg total) by mouth every 6 (six) hours as needed. 08/04/16   Lurene ShadowPhelps, Erin O, PA-C  levofloxacin (LEVAQUIN) 500 MG tablet Take 1 tablet (500 mg total) by mouth daily. 06/29/16   Lattie HawBeese, Stephen A, MD  predniSONE (DELTASONE) 20 MG tablet Take one tab by mouth twice daily for 4 days, then one daily for 3 days. Take with food. 06/29/16   Lattie HawBeese, Stephen A, MD      Allergies    Patient has no known allergies.    Review of Systems   Review of Systems  Constitutional:  Positive for chills. Negative for fever.  HENT: Negative.    Respiratory: Negative.    Cardiovascular: Negative.   Gastrointestinal:  Positive for abdominal pain.  Genitourinary:  Positive for flank pain, hematuria and vaginal bleeding. Negative for vaginal discharge.  Musculoskeletal:  Positive for back pain.  Neurological: Negative.    Physical Exam Updated Vital Signs BP (!) 101/92   Pulse 100   Temp 98.7 F (37.1 C) (Oral)   Resp 15   SpO2 98%  Physical Exam Vitals and nursing note reviewed.  Constitutional:      General: She is in acute distress (Patient writhing around in the bed, tearful, crying out in pain).     Appearance: She is overweight. She is not toxic-appearing.  HENT:     Head: Normocephalic and atraumatic.     Mouth/Throat:  Mouth: Mucous membranes are moist.     Dentition: Abnormal dentition. Dental caries present.     Pharynx: No oropharyngeal exudate or posterior oropharyngeal erythema.  Eyes:     General: Lids are normal. Vision grossly intact.        Right eye: No discharge.        Left eye: No discharge.     Conjunctiva/sclera: Conjunctivae normal.  Cardiovascular:     Rate and Rhythm: Normal rate and regular rhythm.     Pulses: Normal pulses.     Heart sounds: Normal heart sounds. No murmur heard. Pulmonary:     Effort: Pulmonary effort is normal. No tachypnea, bradypnea, accessory muscle usage, prolonged expiration or  respiratory distress.     Breath sounds: Normal breath sounds. No wheezing or rales.  Chest:     Chest wall: No mass, lacerations, deformity, swelling, tenderness or crepitus.  Abdominal:     General: Bowel sounds are normal. There is no distension.     Palpations: Abdomen is soft.     Tenderness: There is abdominal tenderness in the left lower quadrant. There is left CVA tenderness. There is no right CVA tenderness, guarding or rebound.  Musculoskeletal:        General: No deformity.     Cervical back: Neck supple.     Thoracic back: Bony tenderness present.     Lumbar back: Spasms, tenderness and bony tenderness present.       Back:     Right lower leg: No edema.     Left lower leg: No edema.  Skin:    General: Skin is warm and dry.     Capillary Refill: Capillary refill takes less than 2 seconds.  Neurological:     Mental Status: She is alert. Mental status is at baseline.  Psychiatric:        Mood and Affect: Mood normal.    ED Results / Procedures / Treatments   Labs (all labs ordered are listed, but only abnormal results are displayed) Labs Reviewed  CBC WITH DIFFERENTIAL/PLATELET - Abnormal; Notable for the following components:      Result Value   WBC 10.7 (*)    Hemoglobin 11.1 (*)    HCT 34.2 (*)    MCV 79.9 (*)    MCH 25.9 (*)    Neutro Abs 8.1 (*)    All other components within normal limits  COMPREHENSIVE METABOLIC PANEL - Abnormal; Notable for the following components:   Total Protein 8.2 (*)    AST 14 (*)    All other components within normal limits  CULTURE, BLOOD (ROUTINE X 2)  CULTURE, BLOOD (ROUTINE X 2)  LACTIC ACID, PLASMA  URINALYSIS, ROUTINE W REFLEX MICROSCOPIC  I-STAT BETA HCG BLOOD, ED ([facility], [facility], [facility] ONLY)    EKG None  Radiology CT Renal Stone Study  Result Date: 01/10/2022 CLINICAL DATA:  26 year old female presenting with left-sided abdominal and flank pain. EXAM: CT ABDOMEN AND PELVIS WITHOUT CONTRAST TECHNIQUE: Multidetector CT  imaging of the abdomen and pelvis was performed following the standard protocol without IV contrast. RADIATION DOSE REDUCTION: This exam was performed according to the departmental dose-optimization program which includes automated exposure control, adjustment of the mA and/or kV according to patient size and/or use of iterative reconstruction technique. COMPARISON:  No priors. FINDINGS: Lower chest: Unremarkable. Hepatobiliary: Area of low attenuation adjacent to the falciform ligament within the liver, likely focal fatty infiltration. No other definite suspicious cystic or solid hepatic lesions are confidently identified  on today's noncontrast CT examination. Unenhanced appearance of the gallbladder is normal. Pancreas: No definite pancreatic mass or peripancreatic fluid collections or inflammatory changes are noted on today's noncontrast CT examination. Spleen: Spleen is enlarged measuring 13.6 x 6.5 x 14.6 cm (estimated splenic volume of 645 mL) . Adrenals/Urinary Tract: There are no abnormal calcifications within the collecting system of either kidney, along the course of either ureter, or within the lumen of the urinary bladder. No hydroureteronephrosis or perinephric stranding to suggest urinary tract obstruction at this time. The unenhanced appearance of the kidneys is unremarkable bilaterally. Unenhanced appearance of the urinary bladder is normal. Bilateral adrenal glands are normal in appearance. Stomach/Bowel: Unenhanced appearance of the stomach is normal. No pathologic dilatation of small bowel or colon. The appendix is not confidently identified and may be surgically absent. Regardless, there are no inflammatory changes noted adjacent to the cecum to suggest the presence of an acute appendicitis at this time. Vascular/Lymphatic: No atherosclerotic calcifications are noted in the abdominal aorta or pelvic vasculature. No lymphadenopathy noted in the abdomen or pelvis. Reproductive: Unenhanced  appearance of the uterus and ovaries is unremarkable. Other: No significant volume of ascites.  No pneumoperitoneum. Musculoskeletal: There are no aggressive appearing lytic or blastic lesions noted in the visualized portions of the skeleton. IMPRESSION: 1. No acute findings are noted in the abdomen or pelvis to account for the patient's symptoms. 2. Splenomegaly. Electronically Signed   By: Trudie Reed M.D.   On: 01/10/2022 06:34    Procedures Procedures    Medications Ordered in ED Medications  LORazepam (ATIVAN) tablet 0.5 mg (has no administration in time range)  ondansetron (ZOFRAN) injection 4 mg (4 mg Intravenous Given 01/10/22 0519)  morphine (PF) 4 MG/ML injection 4 mg (4 mg Intravenous Given 01/10/22 0521)  lactated ringers bolus 1,000 mL (1,000 mLs Intravenous New Bag/Given 01/10/22 0523)  HYDROmorphone (DILAUDID) injection 0.5 mg (0.5 mg Intravenous Given 01/10/22 0614)  ketorolac (TORADOL) 15 MG/ML injection 15 mg (15 mg Intravenous Given 01/10/22 0071)    ED Course/ Medical Decision Making/ A&P                           Medical Decision Making 26 year old female presents with concern for 3 days of progressive worsening left flank pain with associated hematuria and vaginal bleeding.  Subjective fevers.  Tachycardic and hypertensive on intake, afebrile.  Vital signs otherwise normal.  Cardiopulmonary exam is normal, abdominal exam as above with left CVA tenderness and left lower quadrant tenderness to palpation.  No abdominal distention.  Patient acutely in distress from her pain, writhing around in the bed.  History of IV drug use.   The differential diagnosis of emergent flank pain includes, but is not limited to: Nephrolithiasis/ Renal Colic, Pyelonephritis, Abdominal aortic aneurysm, Aortic dissection, Renal artery embolism, Renal vein thrombosis, Renal infarction, Renal hemorrhage, Mesenteric ischemia, Bladder tumor, Cystitis, Biliary colic, Pancreatitis, Perforated peptic  ulcer,  Appendicitis, Inguinal Hernia, Diverticulitis, Bowel obstruction. Shingles, Lower lobe pneumonia, Retroperitoneal hematoma/abscess/tumor, Epidural abscess, Epidural hematoma.  Particularly in females it is important to consider Ectopic Pregnancy,PID/TOA,Ovarian cyst, Ovarian torsion, STD.  Amount and/or Complexity of Data Reviewed Labs: ordered.    Details: CBC with mild leukocytosis of 10.7.  Hemoglobin mildly low at 11 near patient's baseline.  CMP unremarkable patient is not pregnant.  Lactic acid is normal. Radiology: ordered.  Risk Prescription drug management.   CT renal study negative for acute nephroureterolithiasis to explain patient's pain.  Given history of IV drug use as recent as the last 24 hours, reported chills, and midline tenderness palpation, unfortunately patient is at high risk for epidural infection or transaminitis/discitis.  MRIs of the T and L-spine ordered.  Care of this patient signed out to oncoming ED provider Jannifer Hick, PA-C at time of shift change.  All pertinent HPI, physical exam, and laboratory findings were discussed with him prior to my departure.  Disposition pending completion of work-up  This chart was dictated using voice recognition software, Dragon. Despite the best efforts of this provider to proofread and correct errors, errors may still occur which can change documentation meaning. Final Clinical Impression(s) / ED Diagnoses Final diagnoses:  None    Rx / DC Orders ED Discharge Orders     None         Paris Lore, PA-C 01/10/22 8889    LongArlyss Repress, MD 01/10/22 [phone]

## 2022-01-10 NOTE — ED Notes (Signed)
Patient keeps ripping off her blood pressure cuff and pulse ox cord. RN put it back on 4x. Patient continuing to take it off.

## 2022-01-10 NOTE — ED Notes (Signed)
Pt removed all monitoring.

## 2022-01-10 NOTE — ED Notes (Signed)
Patient stated she cannot provide urine sample. She said it hurts so bad to move and said she can try after more pain medication is given. Rebekah, PA notified that patients pain is uncontrolled still.

## 2022-01-10 NOTE — ED Triage Notes (Addendum)
Patient BIB [facility]. Back pain more to the left side for 3 days. She said it felt like a pulled muscle or a ping pong machine. Throbbing pain that began out of nowhere. Said she tried heating packs, tylenol, benadryl and did heroin (5-10$ worth at 10am).

## 2022-01-10 NOTE — ED Provider Notes (Cosign Needed Addendum)
Physical Exam  BP (!) 118/56 (BP Location: Left Arm)   Pulse (!) 103   Temp 98.7 F (37.1 C) (Oral)   Resp 16   SpO2 99%   Physical Exam Vitals and nursing note reviewed.  Constitutional:      General: She is in acute distress.     Appearance: She is not ill-appearing, toxic-appearing or diaphoretic.     Comments: Patient writhing uncontrollably in bed  HENT:     Head: Normocephalic and atraumatic.     Nose: No congestion.  Eyes:     Extraocular Movements: Extraocular movements intact.     Conjunctiva/sclera: Conjunctivae normal.     Pupils: Pupils are equal, round, and reactive to light.  Cardiovascular:     Rate and Rhythm: Regular rhythm. Tachycardia present.  Pulmonary:     Effort: Pulmonary effort is normal.     Breath sounds: Normal breath sounds. No wheezing.  Abdominal:     General: Abdomen is flat. Bowel sounds are normal.     Palpations: Abdomen is soft.     Tenderness: There is left CVA tenderness.  Musculoskeletal:     Cervical back: Normal range of motion and neck supple. No tenderness.     Lumbar back: Tenderness and bony tenderness present. No swelling. Normal range of motion.  Skin:    General: Skin is warm and dry.     Capillary Refill: Capillary refill takes less than 2 seconds.  Neurological:     General: No focal deficit present.     Mental Status: She is alert and oriented to person, place, and time.     GCS: GCS eye subscore is 4. GCS verbal subscore is 5. GCS motor subscore is 6.     Cranial Nerves: Cranial nerves 2-12 are intact. No cranial nerve deficit.     Sensory: Sensation is intact. No sensory deficit.     Motor: Motor function is intact. No weakness.     Coordination: Coordination is intact. Heel to [facility] Test normal.    Procedures  Procedures  ED Course / MDM    Medical Decision Making Amount and/or Complexity of Data Reviewed Labs: ordered. Radiology: ordered.  Risk Prescription drug management.   Patient signed out to me  at shift change.  Please see previous provider note for further details.  In short, this is a 26 year old female with a history of IV drug use most recently heroin yesterday morning at 9:30 AM who presents to ED for 3-days of worsening left-sided back pain with hematuria and vaginal bleeding.  Patient pain starts on the left side of the back and wraps around to the left abdomen.  Patient has attempted to control pain utilizing heating pads, Tylenol, Benadryl and heroin.  The patient endorses nausea.  Patient endorsing heroin use yesterday morning as well as methamphetamine use 4 days ago.  Patient signed out to me pending MRI of lumbar and thoracic spine to rule out paraspinal abscess.  Patient updated to the plan.  MRI of the lumbar and thoracic spines show possible septic arthritis at L2-L3.  Also noted to show 10 x 15 mm collection posterior left L3 articular process.  Neurosurgery has been consulted, am awaiting callback.  At end of shift, Neurosurgery had not yet given me solid plan for management of patient.   This patient will be signed out to my colleague Kathie Dike PA-C for further disposition and management.            Al Decant, PA-C  01/10/22 1639    Gloris Manchester, MD 01/11/22 [phone]

## 2022-01-10 NOTE — ED Provider Notes (Signed)
  Physical Exam  BP (!) 118/56 (BP Location: Left Arm)   Pulse (!) 103   Temp 98.7 F (37.1 C) (Oral)   Resp 16   SpO2 99%   Physical Exam Constitutional:      General: She is in acute distress.     Appearance: She is not ill-appearing, toxic-appearing or diaphoretic.     Comments: Appears to be moving around in bed due to pain  Cardiovascular:     Rate and Rhythm: Tachycardia present.  Pulmonary:     Effort: Pulmonary effort is normal.  Neurological:     Mental Status: She is alert and oriented to person, place, and time.  Psychiatric:        Mood and Affect: Affect is tearful.    Procedures  Procedures  ED Course / MDM    Medical Decision Making Amount and/or Complexity of Data Reviewed External Data Reviewed: notes. Labs:  Decision-making details documented in ED Course. Radiology: independent interpretation performed. Decision-making details documented in ED Course.  Risk OTC drugs. Prescription drug management. Decision regarding hospitalization.   Patient signed out to me at shift change.  Please see previous provider note for further details.  In short, this is a 26 year old female with an extensive history of IV drug use.  Most recently used heroin and methamphetamines.  Presented today with left low back pain, fevers, and chills.  Tried to relieve pain with tylenol, benadryl, heroin, heat therapy with little to no relief.  Nauseous.  Workup pertinent findings: --Mildly elevated WBC 10.7 --ESR: 75 --CRP: 13.2 --Lactic acid: 1.0 --CT renal study: Negative --MRI lumbar and thoracic: likely septic arthritis at L2-L3 with a 10 x 15 mm collection posterior left L3 articular process --Hematuria with UTI, unclear relevance --Blood cultures: Pending  Medications: --On Vanc + Cefepime --Dilaudid, Toradol for pain --Zofran for nausea --Robaxin for muscle pain  Consultations --Neurosurgery, Dr. Glenford Peers: Does not believe there is appropriateness for  intervention from a neurosurgery standpoint.  Believes patient would best benefit by hospital admission with infectious disease consult and possible interventional radiology involvement for aspiration if needed --Hospitalist, Dr. Maylene Roes: Lonna Duval the patient would best benefit from hospital admission with transfer to [facility] for optimal coordinated efforts with infectious disease and interventional radiology.  I discussed this case with my attending physician Dr. Pearline Cables, who agreed with the proposed treatment course and cosigned this note including patient's presenting symptoms, physical exam, and planned diagnostics and interventions.  Attending physician stated agreement with plan or made changes to plan which were implemented.     This chart was dictated using voice recognition software.  Despite best efforts to proofread, errors can occur which can change the documentation meaning.         Prince Rome, PA-C 03/05/56 1710    Wynona Dove A, DO 01/10/22 [phone]

## 2022-01-10 NOTE — ED Notes (Signed)
Pt writing around in bed, will not sit still to get VS updated. Pt keeps removing all monitoring.

## 2022-01-10 NOTE — ED Notes (Signed)
Called on call MRI tech, Precious Reel to come in.

## 2022-01-10 NOTE — ED Notes (Signed)
Patient transported to MRI 

## 2022-01-10 NOTE — Progress Notes (Signed)
A consult was received from an ED physician for vancomycin per pharmacy dosing (for an indication other than meningitis). The patient's profile has been reviewed for ht/wt/allergies/indication/available labs. A one time order has been placed for the above antibiotics.  Further antibiotics/pharmacy consults should be ordered by admitting physician if indicated.                       Reuel Boom, PharmD, BCPS [phone] 01/10/2022, 1:46 PM

## 2022-01-11 ENCOUNTER — Inpatient Hospital Stay (HOSPITAL_COMMUNITY): Payer: Medicaid Other

## 2022-01-11 DIAGNOSIS — F1920 Other psychoactive substance dependence, uncomplicated: Secondary | ICD-10-CM

## 2022-01-11 DIAGNOSIS — M4656 Other infective spondylopathies, lumbar region: Secondary | ICD-10-CM | POA: Diagnosis not present

## 2022-01-11 DIAGNOSIS — M0088 Arthritis due to other bacteria, vertebrae: Secondary | ICD-10-CM | POA: Diagnosis not present

## 2022-01-11 DIAGNOSIS — R7881 Bacteremia: Secondary | ICD-10-CM

## 2022-01-11 DIAGNOSIS — B9561 Methicillin susceptible Staphylococcus aureus infection as the cause of diseases classified elsewhere: Secondary | ICD-10-CM | POA: Diagnosis not present

## 2022-01-11 DIAGNOSIS — N390 Urinary tract infection, site not specified: Secondary | ICD-10-CM | POA: Diagnosis present

## 2022-01-11 DIAGNOSIS — R319 Hematuria, unspecified: Secondary | ICD-10-CM | POA: Diagnosis not present

## 2022-01-11 DIAGNOSIS — O2342 Unspecified infection of urinary tract in pregnancy, second trimester: Secondary | ICD-10-CM | POA: Diagnosis present

## 2022-01-11 DIAGNOSIS — M4650 Other infective spondylopathies, site unspecified: Secondary | ICD-10-CM | POA: Diagnosis not present

## 2022-01-11 DIAGNOSIS — F1120 Opioid dependence, uncomplicated: Secondary | ICD-10-CM

## 2022-01-11 LAB — CBC
HCT: 30.2 % — ABNORMAL LOW (ref 36.0–46.0)
Hemoglobin: 10.4 g/dL — ABNORMAL LOW (ref 12.0–15.0)
MCH: 26.5 pg (ref 26.0–34.0)
MCHC: 34.4 g/dL (ref 30.0–36.0)
MCV: 76.8 fL — ABNORMAL LOW (ref 80.0–100.0)
Platelets: 292 10*3/uL (ref 150–400)
RBC: 3.93 MIL/uL (ref 3.87–5.11)
RDW: 13.5 % (ref 11.5–15.5)
WBC: 5.7 10*3/uL (ref 4.0–10.5)
nRBC: 0 % (ref 0.0–0.2)

## 2022-01-11 LAB — ECHOCARDIOGRAM COMPLETE
AR max vel: 2.85 cm2
AV Peak grad: 7.7 mmHg
Ao pk vel: 1.39 m/s
Area-P 1/2: 5.75 cm2
Calc EF: 64.6 %
Height: 67 in
S' Lateral: 3.3 cm
Single Plane A2C EF: 65.8 %
Single Plane A4C EF: 63.9 %

## 2022-01-11 LAB — RAPID URINE DRUG SCREEN, HOSP PERFORMED
Amphetamines: POSITIVE — AB
Barbiturates: NOT DETECTED
Benzodiazepines: NOT DETECTED
Cocaine: NOT DETECTED
Opiates: POSITIVE — AB
Tetrahydrocannabinol: POSITIVE — AB

## 2022-01-11 LAB — BASIC METABOLIC PANEL
Anion gap: 10 (ref 5–15)
BUN: 7 mg/dL (ref 6–20)
CO2: 23 mmol/L (ref 22–32)
Calcium: 8.9 mg/dL (ref 8.9–10.3)
Chloride: 98 mmol/L (ref 98–111)
Creatinine, Ser: 0.5 mg/dL (ref 0.44–1.00)
GFR, Estimated: >60 mL/min (ref >60)
Glucose, Bld: 114 mg/dL — ABNORMAL HIGH (ref 70–99)
Potassium: 3.3 mmol/L — ABNORMAL LOW (ref 3.5–5.1)
Sodium: 131 mmol/L — ABNORMAL LOW (ref 135–145)

## 2022-01-11 LAB — RPR: RPR Ser Ql: NONREACTIVE

## 2022-01-11 LAB — MAGNESIUM: Magnesium: 2.1 mg/dL (ref 1.7–2.4)

## 2022-01-11 LAB — HIV ANTIBODY (ROUTINE TESTING W REFLEX): HIV Screen 4th Generation wRfx: NONREACTIVE

## 2022-01-11 MED ORDER — LOPERAMIDE HCL 2 MG PO CAPS
2.0000 mg | ORAL_CAPSULE | ORAL | Status: AC | PRN
  Administered 2022-01-14: 4 mg via ORAL
  Filled 2022-01-11: qty 2

## 2022-01-11 MED ORDER — SENNOSIDES-DOCUSATE SODIUM 8.6-50 MG PO TABS
1.0000 | ORAL_TABLET | Freq: Two times a day (BID) | ORAL | Status: DC
  Administered 2022-01-11 – 2022-01-17 (×7): 1 via ORAL
  Filled 2022-01-11 (×17): qty 1

## 2022-01-11 MED ORDER — HYDROXYZINE HCL 25 MG PO TABS
25.0000 mg | ORAL_TABLET | Freq: Four times a day (QID) | ORAL | Status: DC | PRN
  Administered 2022-01-11 – 2022-01-12 (×3): 25 mg via ORAL
  Filled 2022-01-11 (×3): qty 1

## 2022-01-11 MED ORDER — NICOTINE 21 MG/24HR TD PT24
21.0000 mg | MEDICATED_PATCH | Freq: Every day | TRANSDERMAL | Status: DC
  Administered 2022-01-11 – 2022-01-19 (×9): 21 mg via TRANSDERMAL
  Filled 2022-01-11 (×11): qty 1

## 2022-01-11 MED ORDER — KETOROLAC TROMETHAMINE 30 MG/ML IJ SOLN
30.0000 mg | Freq: Four times a day (QID) | INTRAMUSCULAR | Status: AC | PRN
  Administered 2022-01-11 – 2022-01-16 (×18): 30 mg via INTRAVENOUS
  Filled 2022-01-11 (×18): qty 1

## 2022-01-11 MED ORDER — OXYCODONE HCL 5 MG PO TABS
5.0000 mg | ORAL_TABLET | ORAL | Status: DC | PRN
  Administered 2022-01-11 – 2022-01-15 (×15): 10 mg via ORAL
  Filled 2022-01-11 (×15): qty 2

## 2022-01-11 MED ORDER — POTASSIUM CHLORIDE 20 MEQ PO PACK
20.0000 meq | PACK | Freq: Once | ORAL | Status: AC
Start: 2022-01-11 — End: 2022-01-11
  Administered 2022-01-11: 20 meq via ORAL
  Filled 2022-01-11: qty 1

## 2022-01-11 MED ORDER — DICYCLOMINE HCL 20 MG PO TABS
20.0000 mg | ORAL_TABLET | Freq: Four times a day (QID) | ORAL | Status: AC | PRN
  Administered 2022-01-11 – 2022-01-16 (×6): 20 mg via ORAL
  Filled 2022-01-11 (×9): qty 1

## 2022-01-11 MED ORDER — ONDANSETRON 4 MG PO TBDP
4.0000 mg | ORAL_TABLET | Freq: Four times a day (QID) | ORAL | Status: AC | PRN
  Administered 2022-01-16: 4 mg via ORAL
  Filled 2022-01-11: qty 1

## 2022-01-11 MED ORDER — METHOCARBAMOL 500 MG PO TABS: 500.0000 mg | ORAL_TABLET | Freq: Three times a day (TID) | ORAL | Status: DC | PRN

## 2022-01-11 MED ORDER — CEFAZOLIN SODIUM-DEXTROSE 2-4 GM/100ML-% IV SOLN
2.0000 g | Freq: Three times a day (TID) | INTRAVENOUS | Status: DC
  Administered 2022-01-11 – 2022-01-24 (×39): 2 g via INTRAVENOUS
  Filled 2022-01-11 (×39): qty 100

## 2022-01-11 MED ORDER — POTASSIUM CHLORIDE CRYS ER 10 MEQ PO TBCR
40.0000 meq | EXTENDED_RELEASE_TABLET | Freq: Once | ORAL | Status: AC
  Administered 2022-01-11: 40 meq via ORAL
  Filled 2022-01-11: qty 4

## 2022-01-11 MED ORDER — NAPROXEN 250 MG PO TABS
500.0000 mg | ORAL_TABLET | Freq: Two times a day (BID) | ORAL | Status: DC | PRN
Start: 2022-01-16 — End: 2022-01-16
  Administered 2022-01-15: 500 mg via ORAL
  Filled 2022-01-11: qty 2

## 2022-01-11 NOTE — Plan of Care (Signed)

## 2022-01-11 NOTE — Progress Notes (Signed)
Procedure Request - L3 articular process drain placement - aspiration.   26 y.o. female inpatient. History of substance abuse, mood disorder. Presented to the ED at [facility] on 5.29.23 with lumbar pain and subjective fevers X 3 days. Patient reports no improvement in pain with Tylenol, Benadryl, heroin and heat therapy prior to arrival. MR Lumbar spine from 5.19.23 reads Left-sided intrinsic back muscles myositis with 10 x 15 mm collection posterior to the left L3 articular process. Suspect septic arthritis at L2-3. Team  is requesting aspiration of Left L3 articular process for further evaluation for septic arthritis.   After review of procedure request by IR Attending Dr. Bryn Gulling and Dr. Elby Showers area in question is too small for percutaneous access. Recommend Team continue to medically manage. Should patient condition change please obtain imaging and re consult. This was communicated directly to the Team.  Please call IR with questions/concerns.

## 2022-01-11 NOTE — Consult Note (Addendum)
[facility]    Date of Admission:  01/10/2022    Total days of antibiotics 2 Cefazolin 5/30 - present Cefepime/Vancomycin 5/29 - DC              Reason for Consult: MSSA bacteremia    Referring Provider: Dr. Janee Mornhompson Primary Care Provider: NA  Assessment: MSSA bacteremia L2-3 septic arthritis Lumbar abscess Polysubstance use disorder  26 year old female, current IV drug use, admitted for septic arthritis at L2-3 with myositis and possible 10 x 15 mm abscess at L3.  Blood culture grew MSSA in 1/2 sets.  Her antibiotic regimen was narrowed down to cefazolin today.  IR was consulted for abscess aspiration however IR team thought the abscess was too small for percutaneous access.  They recommended medical management.  TTE was ordered and scheduled.  Patient will likely need a TEE as well.  She has no stigmata of endocarditis.  We will repeat blood culture tomorrow  HIV was negative.  We will check RPR and hep C as well.  We counseled patient on the importance of IV drug cessation.  She may benefit from starting Suboxone or methadone for opioid use disorder during this admission.  She has high risk for leaving AMA due to her anxiety.  Plan: Start cefazolin Pending TTE Repeat blood culture tomorrow Pending RPR and hepatitis C antibody Monitor for any change in neurological function of bilateral LE  Principal Problem:   Septic arthritis of lumbar spine (HCC) Active Problems:   Drug abuse and dependence (HCC)   Hematuria   Staphylococcus aureus bacteremia   UTI (urinary tract infection)   Scheduled Meds:  enoxaparin (LOVENOX) injection  40 mg Subcutaneous Q24H   senna-docusate  1 tablet Oral BID   sodium chloride flush  3 mL Intravenous Q12H   Continuous Infusions:  sodium chloride      ceFAZolin (ANCEF) IV 2 g (01/11/22 0848)   PRN Meds:.sodium chloride, acetaminophen **OR** acetaminophen, dicyclomine, fentaNYL (SUBLIMAZE) injection,  HYDROcodone-acetaminophen, hydrOXYzine, ketorolac, loperamide, methocarbamol, [START ON 01/16/2022] naproxen, ondansetron **OR** ondansetron (ZOFRAN) IV, ondansetron, polyethylene glycol, sodium chloride flush  HPI: Krystal Valdez is a 26 y.o. female with history of mood disorder, current IV drug user who presents to the hospital for 3 days of lumbar back pain.  She reports sudden lower back pain which she thought was a kidney stone.  She denies fever/chills at home but endorses subjective fever in the hospital.  She denies any new neurological symptoms such as lower extremity weakness, loss control of urination or bowels.  Denies any skin changes.  States that pain has been better controlled.  Patient report injecting heroin for 2-3 years.  Reports using clean needle provided by her partner.  Denies past bacteremia.  She was on methadone and Suboxone in the past but relapsed a few years ago.  States that Suboxone works better for her due to the convenience.  She has several social stressors including her daughter passed away when she was [age].  Said that both of her parents were drug user but her dad has been in remission.  She also have support from her grandparents.   Review of Systems: Per HPI  Past Medical History:  Diagnosis Date   Allergy    Asthma    Asthma    Left breast abscess 04/02/2013   Will change antibiotics to septra Ds and refer to Dr Lovell SheehanJenkins 8/21 at 11 am   Mastitis, left, acute  03/28/2013   rx augmentin.   Pneumonia    UTI (lower urinary tract infection)     Social History   Tobacco Use   Smoking status: Every Day    Packs/day: 1.00    Years: 4.00    Pack years: 4.00    Types: Cigarettes   Smokeless tobacco: Never   Tobacco comments:    1/2 pack a day  Substance Use Topics   Alcohol use: No   Drug use: Yes    Types: IV    Comment: heroin    Family History  Problem Relation Age of Onset   Hypertension Other    Diabetes Other    Cancer Other        hx  breast,colon,cervical cancer   Hypertension Father    Hyperlipidemia Father    Diabetes Paternal Grandfather    No Known Allergies  OBJECTIVE: Blood pressure 125/71, pulse 90, temperature 98.9 F (37.2 C), temperature source Oral, resp. rate 18, SpO2 100 %.  Physical Exam Constitutional:      General: She is not in acute distress.    Appearance: She is not toxic-appearing.  HENT:     Head: Normocephalic.  Eyes:     General:        Right eye: No discharge.        Left eye: No discharge.     Conjunctiva/sclera: Conjunctivae normal.  Cardiovascular:     Rate and Rhythm: Regular rhythm. Tachycardia present.     Heart sounds: No murmur heard. Pulmonary:     Effort: Pulmonary effort is normal. No respiratory distress.  Abdominal:     General: There is no distension.     Palpations: Abdomen is soft.     Tenderness: There is no abdominal tenderness.  Musculoskeletal:     Comments: No stigmata of endocarditis  Neurological:     Mental Status: She is alert.     Comments: 5/5 strength of bilateral lower extremities.  Psychiatric:     Comments: Anxious    Lab Results Lab Results  Component Value Date   WBC 5.7 01/11/2022   HGB 10.4 (L) 01/11/2022   HCT 30.2 (L) 01/11/2022   MCV 76.8 (L) 01/11/2022   PLT 292 01/11/2022    Lab Results  Component Value Date   CREATININE 0.50 01/11/2022   BUN 7 01/11/2022   NA 131 (L) 01/11/2022   K 3.3 (L) 01/11/2022   CL 98 01/11/2022   CO2 23 01/11/2022    Lab Results  Component Value Date   ALT 13 01/10/2022   AST 14 (L) 01/10/2022   ALKPHOS 71 01/10/2022   BILITOT 0.5 01/10/2022     Microbiology: Recent Results (from the past 240 hour(s))  Blood culture (routine x 2)     Status: Abnormal (Preliminary result)   Collection Time: 01/10/22  5:12 AM   Specimen: BLOOD  Result Value Ref Range Status   Specimen Description   Final    BLOOD LEFT ANTECUBITAL Performed at [facility], [address]  [place], [place] [address]    Special Requests   Final    BOTTLES DRAWN AEROBIC AND ANAEROBIC Blood Culture results may not be optimal due to an inadequate volume of blood received in culture bottles Performed at [facility], [address] [place], [place] [address]    Culture  Setup Time   Final    GRAM POSITIVE COCCI AEROBIC BOTTLE ONLY CRITICAL RESULT CALLED TO, READ BACK BY AND VERIFIED  WITH: PHARMD TONY  RUDISILL ON 01/10/22 @ 2309 BY DRT    Culture (A)  Final    STAPHYLOCOCCUS AUREUS SUSCEPTIBILITIES TO FOLLOW Performed at [facility], [address] [place], [place] [address]    Report Status PENDING  Incomplete  Blood Culture ID Panel (Reflexed)     Status: Abnormal   Collection Time: 01/10/22  5:12 AM  Result Value Ref Range Status   Enterococcus faecalis NOT DETECTED NOT DETECTED Final   Enterococcus Faecium NOT DETECTED NOT DETECTED Final   Listeria monocytogenes NOT DETECTED NOT DETECTED Final   Staphylococcus species DETECTED (A) NOT DETECTED Final    Comment: CRITICAL RESULT CALLED TO, READ BACK BY AND VERIFIED WITH: PHARMD TONY  RUDISILL ON 01/10/22 @ 2309 BY DRT    Staphylococcus aureus (BCID) DETECTED (A) NOT DETECTED Final    Comment: CRITICAL RESULT CALLED TO, READ BACK BY AND VERIFIED WITH: PHARMD TONY  RUDISILL ON 01/10/22 @ 2309 BY DRT    Staphylococcus epidermidis NOT DETECTED NOT DETECTED Final   Staphylococcus lugdunensis NOT DETECTED NOT DETECTED Final   Streptococcus species NOT DETECTED NOT DETECTED Final   Streptococcus agalactiae NOT DETECTED NOT DETECTED Final   Streptococcus pneumoniae NOT DETECTED NOT DETECTED Final   Streptococcus pyogenes NOT DETECTED NOT DETECTED Final   A.calcoaceticus-baumannii NOT DETECTED NOT DETECTED Final   Bacteroides fragilis NOT DETECTED NOT DETECTED Final   Enterobacterales NOT DETECTED NOT DETECTED Final   Enterobacter cloacae complex NOT DETECTED NOT DETECTED Final   Escherichia coli NOT  DETECTED NOT DETECTED Final   Klebsiella aerogenes NOT DETECTED NOT DETECTED Final   Klebsiella oxytoca NOT DETECTED NOT DETECTED Final   Klebsiella pneumoniae NOT DETECTED NOT DETECTED Final   Proteus species NOT DETECTED NOT DETECTED Final   Salmonella species NOT DETECTED NOT DETECTED Final   Serratia marcescens NOT DETECTED NOT DETECTED Final   Haemophilus influenzae NOT DETECTED NOT DETECTED Final   Neisseria meningitidis NOT DETECTED NOT DETECTED Final   Pseudomonas aeruginosa NOT DETECTED NOT DETECTED Final   Stenotrophomonas maltophilia NOT DETECTED NOT DETECTED Final   Candida albicans NOT DETECTED NOT DETECTED Final   Candida auris NOT DETECTED NOT DETECTED Final   Candida glabrata NOT DETECTED NOT DETECTED Final   Candida krusei NOT DETECTED NOT DETECTED Final   Candida parapsilosis NOT DETECTED NOT DETECTED Final   Candida tropicalis NOT DETECTED NOT DETECTED Final   Cryptococcus neoformans/gattii NOT DETECTED NOT DETECTED Final   Meth resistant mecA/C and MREJ NOT DETECTED NOT DETECTED Final    Comment: Performed at [facility], [address] [place], [place] [address]  Blood culture (routine x 2)     Status: None (Preliminary result)   Collection Time: 01/10/22  5:24 AM   Specimen: BLOOD  Result Value Ref Range Status   Specimen Description   Final    BLOOD RIGHT ANTECUBITAL Performed at [facility], [address] [place], [place] [address]    Special Requests   Final    BOTTLES DRAWN AEROBIC AND ANAEROBIC Blood Culture adequate volume Performed at [facility], [address], [place], [place] [address]    Culture   Final    NO GROWTH 1 DAY Performed at [facility], [address], [place], [place] [address]    Report Status PENDING  Incomplete    Doran Stabler, MD [facility]  01/11/2022, 10:11 AM

## 2022-01-11 NOTE — Progress Notes (Addendum)
PROGRESS NOTE    Krystal Valdez  OZH:[id number] DOB: Apr 25, 1996 DOA: 01/10/2022 PCP: Pcp, No    Chief Complaint  Krystal Valdez presents with   Flank Pain    Brief Narrative:  Krystal Valdez 26 year old female history of IV drug use, mood disorder not on any medication in the outpatient setting presenting with 3-day history of lumbar back pain.  Krystal Valdez noted to have elevated sed rate, leukocytosis.  MRI of the T and L-spine concerning for septic arthritis at L2-L3 and a 10 x 15 mm fluid collection.  Krystal Valdez placed empirically on IV Vanco and IV cefepime and pancultured.  Blood cultures positive for Staph aureus.  Krystal Valdez admitted.  ID consulted.  IR consulted.   Assessment & Plan:   Principal Problem:   Septic arthritis of lumbar spine (HCC) Active Problems:   Staphylococcus aureus bacteremia   Drug abuse and dependence (HCC)   Hematuria   UTI (urinary tract infection)   Septic arthritis of vertebra (HCC)  #1 septic arthritis of the L-spine -MRI T and L-spine with concern for septic arthritis.  Left-sided intrinsic back muscles myositis with 10 x 15 mm collection posterior to the left L3 articular process. -Krystal Valdez pancultured with blood cultures positive for staph. -ED PA discussed with neurosurgery who felt no acute neurosurgical intervention necessary at this time. -IR reviewed films and feel fluid collection too small for percutaneous access and recommend medical management. -Continue IV cefazolin -Pain management. -ID following.  2.  MSSA bacteremia -Noted in 1/2 blood cultures. -Krystal Valdez placed on IV cefazolin. -2D echo ordered. -Hepatitis panel, RPR ordered per ID. -ID following and appreciate input and recommendations.  3.  UTI -Check urine cultures. -Continue IV cefazolin.  4.  Tobacco abuse -Tobacco cessation. -Placed on a nicotine patch.  5.  Polysubstance abuse -Krystal Valdez admits to heroin and methamphetamine use. -Polysubstance cessation stressed to Krystal Valdez. -TOC  consultation placed. -Clonidine detox protocol.  6.  Hypokalemia -Replete.   DVT prophylaxis: Lovenox Code Status: Full Family Communication: Updated Krystal Valdez and boyfriend at bedside. Disposition: TBD  Status is: Inpatient Remains inpatient appropriate because: Severity of   Consultants:  Infectious disease: Dr. Earlene Plater 01/11/2022 IR  Procedures:  CT renal stone protocol 01/10/2022 MRI T and L-spine 01/10/2022>>>> 2D echo 01/11/2022   Antimicrobials:  IV cefepime 01/10/2022>>>> IV vancomycin 01/10/2022>>>>> 01/11/2022   Subjective: Krystal Valdez laying in bed.  Krystal Valdez complains of back pain.  Krystal Valdez asking whether she can go outside and smoke a cigarette.  No chest pain.  No shortness of breath.  Seems somewhat uncomfortable in bed.  Boyfriend at bedside.  Objective: Vitals:   01/11/22 1231 01/11/22 1345 01/11/22 1505 01/11/22 1550  BP: (!) 112/57     Pulse: 87     Resp: Temp: 98.8 F (37.1 C)     TempSrc: Oral     SpO2: 100%     Height:        Intake/Output Summary (Last 24 hours) at 01/11/2022 1752 Last data filed at 01/11/2022 0011 Gross per 24 hour  Intake 720 ml  Output 0 ml  Net 720 ml   There were no vitals filed for this visit.  Examination:  General exam: Appears calm and comfortable  Respiratory system: Clear to auscultation. Respiratory effort normal. Cardiovascular system: S1 & S2 heard, RRR. No JVD, murmurs, rubs, gallops or clicks. No pedal edema. Gastrointestinal system: Abdomen is nondistended, soft and nontender. No organomegaly or masses felt. Normal bowel sounds heard. Central nervous system: Alert and oriented. No  focal neurological deficits. Extremities: Symmetric 5 x 5 power. Skin: No rashes, lesions or ulcers Psychiatry: Judgement and insight appear normal. Mood & affect appropriate.     Data Reviewed: I have personally reviewed following labs and imaging studies  CBC: Recent Labs  Lab 01/10/22 0512 01/11/22 0412  WBC  10.7* 5.7  NEUTROABS 8.1*  --   HGB 11.1* 10.4*  HCT 34.2* 30.2*  MCV 79.9* 76.8*  PLT 330 292    Basic Metabolic Panel: Recent Labs  Lab 01/10/22 0512 01/11/22 0412  NA 136 131*  K 3.8 3.3*  CL 100 98  CO2 27 23  GLUCOSE 94 114*  BUN 12 7  CREATININE 0.49 0.50  CALCIUM 9.2 8.9  MG  --  2.1    GFR: CrCl cannot be calculated (Unknown ideal weight.).  Liver Function Tests: Recent Labs  Lab 01/10/22 0512  AST 14*  ALT 13  ALKPHOS 71  BILITOT 0.5  PROT 8.2*  ALBUMIN 3.8    CBG: No results for input(s): GLUCAP in the last 168 hours.   Recent Results (from the past 240 hour(s))  Blood culture (routine x 2)     Status: Abnormal (Preliminary result)   Collection Time: 01/10/22  5:12 AM   Specimen: BLOOD  Result Value Ref Range Status   Specimen Description   Final    BLOOD LEFT ANTECUBITAL Performed at [facility], [address], [place], [place] [address]    Special Requests   Final    BOTTLES DRAWN AEROBIC AND ANAEROBIC Blood Culture results may not be optimal due to an inadequate volume of blood received in culture bottles Performed at [facility], [address], [place], [place] [address]    Culture  Setup Time   Final    GRAM POSITIVE COCCI AEROBIC BOTTLE ONLY CRITICAL RESULT CALLED TO, READ BACK BY AND VERIFIED WITH: PHARMD TONY  RUDISILL ON 01/10/22 @ 2309 BY DRT    Culture (A)  Final    STAPHYLOCOCCUS AUREUS SUSCEPTIBILITIES TO FOLLOW Performed at [facility], [address], [place], [place] [address]    Report Status PENDING  Incomplete  Blood Culture ID Panel (Reflexed)     Status: Abnormal   Collection Time: 01/10/22  5:12 AM  Result Value Ref Range Status   Enterococcus faecalis NOT DETECTED NOT DETECTED Final   Enterococcus Faecium NOT DETECTED NOT DETECTED Final   Listeria monocytogenes NOT DETECTED NOT DETECTED Final   Staphylococcus species DETECTED (A) NOT DETECTED Final    Comment:  CRITICAL RESULT CALLED TO, READ BACK BY AND VERIFIED WITH: PHARMD TONY  RUDISILL ON 01/10/22 @ 2309 BY DRT    Staphylococcus aureus (BCID) DETECTED (A) NOT DETECTED Final    Comment: CRITICAL RESULT CALLED TO, READ BACK BY AND VERIFIED WITH: PHARMD TONY  RUDISILL ON 01/10/22 @ 2309 BY DRT    Staphylococcus epidermidis NOT DETECTED NOT DETECTED Final   Staphylococcus lugdunensis NOT DETECTED NOT DETECTED Final   Streptococcus species NOT DETECTED NOT DETECTED Final   Streptococcus agalactiae NOT DETECTED NOT DETECTED Final   Streptococcus pneumoniae NOT DETECTED NOT DETECTED Final   Streptococcus pyogenes NOT DETECTED NOT DETECTED Final   A.calcoaceticus-baumannii NOT DETECTED NOT DETECTED Final   Bacteroides fragilis NOT DETECTED NOT DETECTED Final   Enterobacterales NOT DETECTED NOT DETECTED Final   Enterobacter cloacae complex NOT DETECTED NOT DETECTED Final   Escherichia coli NOT DETECTED NOT DETECTED Final   Klebsiella aerogenes NOT DETECTED NOT DETECTED  Final   Klebsiella oxytoca NOT DETECTED NOT DETECTED Final   Klebsiella pneumoniae NOT DETECTED NOT DETECTED Final   Proteus species NOT DETECTED NOT DETECTED Final   Salmonella species NOT DETECTED NOT DETECTED Final   Serratia marcescens NOT DETECTED NOT DETECTED Final   Haemophilus influenzae NOT DETECTED NOT DETECTED Final   Neisseria meningitidis NOT DETECTED NOT DETECTED Final   Pseudomonas aeruginosa NOT DETECTED NOT DETECTED Final   Stenotrophomonas maltophilia NOT DETECTED NOT DETECTED Final   Candida albicans NOT DETECTED NOT DETECTED Final   Candida auris NOT DETECTED NOT DETECTED Final   Candida glabrata NOT DETECTED NOT DETECTED Final   Candida krusei NOT DETECTED NOT DETECTED Final   Candida parapsilosis NOT DETECTED NOT DETECTED Final   Candida tropicalis NOT DETECTED NOT DETECTED Final   Cryptococcus neoformans/gattii NOT DETECTED NOT DETECTED Final   Meth resistant mecA/C and MREJ NOT DETECTED NOT DETECTED Final     Comment: Performed at [facility], [address], [place], [place] [address]  Blood culture (routine x 2)     Status: None (Preliminary result)   Collection Time: 01/10/22  5:24 AM   Specimen: BLOOD  Result Value Ref Range Status   Specimen Description   Final    BLOOD RIGHT ANTECUBITAL Performed at [facility], [address], [place], [place] [address]    Special Requests   Final    BOTTLES DRAWN AEROBIC AND ANAEROBIC Blood Culture adequate volume Performed at [facility], [address], [place], [place] [address]    Culture   Final    NO GROWTH 1 DAY Performed at [facility], [address] [place], [place] [address]    Report Status PENDING  Incomplete         Radiology Studies: MR THORACIC SPINE W WO CONTRAST  Result Date: 01/10/2022 CLINICAL DATA:  Lower back pain with infection suspected. IV drug abuse EXAM: MRI THORACIC AND LUMBAR SPINE WITHOUT AND WITH CONTRAST TECHNIQUE: Multiplanar and multiecho pulse sequences of the thoracic and lumbar spine were obtained without and with intravenous contrast. CONTRAST:  7mL GADAVIST GADOBUTROL 1 MMOL/ML IV SOLN COMPARISON:  None Available. FINDINGS: MRI THORACIC SPINE FINDINGS Alignment:  Physiologic. Vertebrae: No fracture, evidence of discitis, or bone lesion. Cord:  Normal signal and morphology. Paraspinal and other soft tissues: Negative. Disc levels: Diffusely preserved disc height and hydration. No herniation or impingement MRI LUMBAR SPINE FINDINGS Segmentation: Counting sequence on thoracic spine study is nondiagnostic due to motion. Based on the upper most ribs there are 6 lumbar type vertebrae, the lowest numbered S1. Alignment:  Normal Vertebrae: Mild marrow edema around the left L2-3 facet with extensive regional muscular T2 hyperintensity and enhancement. On postcontrast imaging there is collection posterior to the left L3 articular process measuring 10 x 15 mm. Conus  medullaris: Extends to the L2-3 level and appears normal. Paraspinal and other soft tissues: Left-sided myositis and periarticular collection as noted above. Disc levels: No degenerative changes or impingement IMPRESSION: Thoracic spine: Negative. Lumbar spine: 1. When numbered from above there is a transitional S1 vertebra. 2. Left-sided intrinsic back muscles myositis with 10 x 15 mm collection posterior to the left L3 articular process. Suspect septic arthritis at L2-3. Electronically Signed   By: Tiburcio Pea M.D.   On: 01/10/2022 11:28   MR Lumbar Spine W Wo Contrast  Result Date: 01/10/2022 CLINICAL DATA:  Lower back pain with infection suspected. IV drug abuse EXAM: MRI THORACIC AND  LUMBAR SPINE WITHOUT AND WITH CONTRAST TECHNIQUE: Multiplanar and multiecho pulse sequences of the thoracic and lumbar spine were obtained without and with intravenous contrast. CONTRAST:  7mL GADAVIST GADOBUTROL 1 MMOL/ML IV SOLN COMPARISON:  None Available. FINDINGS: MRI THORACIC SPINE FINDINGS Alignment:  Physiologic. Vertebrae: No fracture, evidence of discitis, or bone lesion. Cord:  Normal signal and morphology. Paraspinal and other soft tissues: Negative. Disc levels: Diffusely preserved disc height and hydration. No herniation or impingement MRI LUMBAR SPINE FINDINGS Segmentation: Counting sequence on thoracic spine study is nondiagnostic due to motion. Based on the upper most ribs there are 6 lumbar type vertebrae, the lowest numbered S1. Alignment:  Normal Vertebrae: Mild marrow edema around the left L2-3 facet with extensive regional muscular T2 hyperintensity and enhancement. On postcontrast imaging there is collection posterior to the left L3 articular process measuring 10 x 15 mm. Conus medullaris: Extends to the L2-3 level and appears normal. Paraspinal and other soft tissues: Left-sided myositis and periarticular collection as noted above. Disc levels: No degenerative changes or impingement IMPRESSION:  Thoracic spine: Negative. Lumbar spine: 1. When numbered from above there is a transitional S1 vertebra. 2. Left-sided intrinsic back muscles myositis with 10 x 15 mm collection posterior to the left L3 articular process. Suspect septic arthritis at L2-3. Electronically Signed   By: Tiburcio PeaJonathan  Watts M.D.   On: 01/10/2022 11:28   ECHOCARDIOGRAM COMPLETE  Result Date: 01/11/2022    ECHOCARDIOGRAM REPORT   Krystal Valdez Name:   Almira CoasterSHLEY J Cabacungan Date of Exam: 01/11/2022 Medical Rec #:  [id number]       Height:       67.0 in Accession #:    [id number]      Weight:       145.0 lb Date of Birth:  1996-02-02      BSA:          1.764 m Krystal Valdez Age:    25 years        BP:           125/71 mmHg Krystal Valdez Gender: F               HR:           91 bpm. Exam Location:  Inpatient Procedure: 2D Echo, Cardiac Doppler and Color Doppler Indications:    Bacteremia  History:        Krystal Valdez has no prior history of Echocardiogram examinations.  Sonographer:    Cleatis Polkaaylor Peper Referring Phys: [id number] Erhard Senske V Mahira Petras IMPRESSIONS  1. Left ventricular ejection fraction, by estimation, is 60 to 65%. The left ventricle has normal function. The left ventricle has no regional wall motion abnormalities. Left ventricular diastolic parameters were normal.  2. Right ventricular systolic function is normal. The right ventricular size is normal.  3. The mitral valve is normal in structure. Mild mitral valve regurgitation.  4. The aortic valve is normal in structure. Aortic valve regurgitation is not visualized.  5. The inferior vena cava is normal in size with greater than 50% respiratory variability, suggesting right atrial pressure of 3 mmHg. FINDINGS  Left Ventricle: Left ventricular ejection fraction, by estimation, is 60 to 65%. The left ventricle has normal function. The left ventricle has no regional wall motion abnormalities. The left ventricular internal cavity size was normal in size. There is  no left ventricular hypertrophy. Left ventricular diastolic  parameters were normal. Right Ventricle: The right ventricular size is normal. Right vetricular wall thickness was not assessed. Right ventricular systolic function is normal.  Left Atrium: Left atrial size was normal in size. Right Atrium: Right atrial size was normal in size. Pericardium: There is no evidence of pericardial effusion. Mitral Valve: The mitral valve is normal in structure. Mild mitral valve regurgitation. Tricuspid Valve: The tricuspid valve is normal in structure. Tricuspid valve regurgitation is trivial. Aortic Valve: The aortic valve is normal in structure. Aortic valve regurgitation is not visualized. Aortic valve peak gradient measures 7.7 mmHg. Pulmonic Valve: The pulmonic valve was not well visualized. Pulmonic valve regurgitation is mild. No evidence of pulmonic stenosis. Aorta: The aortic root and ascending aorta are structurally normal, with no evidence of dilitation. Venous: The inferior vena cava is normal in size with greater than 50% respiratory variability, suggesting right atrial pressure of 3 mmHg. IAS/Shunts: No atrial level shunt detected by color flow Doppler.  LEFT VENTRICLE PLAX 2D LVIDd:         4.50 cm      Diastology LVIDs:         3.30 cm      LV e' medial:    9.68 cm/s LV PW:         1.00 cm      LV E/e' medial:  10.7 LV IVS:        0.80 cm      LV e' lateral:   11.60 cm/s LVOT diam:     2.00 cm      LV E/e' lateral: 9.0 LV SV:         72 LV SV Index:   41 LVOT Area:     3.14 cm  LV Volumes (MOD) LV vol d, MOD A2C: 109.0 ml LV vol d, MOD A4C: 102.0 ml LV vol s, MOD A2C: 37.3 ml LV vol s, MOD A4C: 36.8 ml LV SV MOD A2C:     71.7 ml LV SV MOD A4C:     102.0 ml LV SV MOD BP:      69.5 ml RIGHT VENTRICLE             IVC RV Basal diam:  2.80 cm     IVC diam: 1.90 cm RV Mid diam:    2.80 cm RV S prime:     15.40 cm/s TAPSE (M-mode): 2.4 cm LEFT ATRIUM             Index        RIGHT ATRIUM          Index LA diam:        3.00 cm 1.70 cm/m   RA Area:     9.61 cm LA Vol (A2C):    30.4 ml 17.23 ml/m  RA Volume:   19.00 ml 10.77 ml/m LA Vol (A4C):   44.0 ml 24.94 ml/m LA Biplane Vol: 38.0 ml 21.54 ml/m  AORTIC VALVE AV Area (Vmax): 2.85 cm AV Vmax:        139.00 cm/s AV Peak Grad:   7.7 mmHg LVOT Vmax:      126.00 cm/s LVOT Vmean:     83.500 cm/s LVOT VTI:       0.229 m  AORTA Ao Root diam: 2.90 cm Ao Asc diam:  2.40 cm MITRAL VALVE MV Area (PHT): 5.75 cm     SHUNTS MV Decel Time: 132 msec     Systemic VTI:  0.23 m MV E velocity: 104.00 cm/s  Systemic Diam: 2.00 cm MV A velocity: 104.00 cm/s MV E/A ratio:  1.00 Dietrich Pates MD Electronically signed by Dietrich Pates MD  Signature Date/Time: 01/11/2022/12:07:39 PM    Final    CT Renal Stone Study  Result Date: 01/10/2022 CLINICAL DATA:  26 year old female presenting with left-sided abdominal and flank pain. EXAM: CT ABDOMEN AND PELVIS WITHOUT CONTRAST TECHNIQUE: Multidetector CT imaging of the abdomen and pelvis was performed following the standard protocol without IV contrast. RADIATION DOSE REDUCTION: This exam was performed according to the departmental dose-optimization program which includes automated exposure control, adjustment of the mA and/or kV according to Krystal Valdez size and/or use of iterative reconstruction technique. COMPARISON:  No priors. FINDINGS: Lower chest: Unremarkable. Hepatobiliary: Area of low attenuation adjacent to the falciform ligament within the liver, likely focal fatty infiltration. No other definite suspicious cystic or solid hepatic lesions are confidently identified on today's noncontrast CT examination. Unenhanced appearance of the gallbladder is normal. Pancreas: No definite pancreatic mass or peripancreatic fluid collections or inflammatory changes are noted on today's noncontrast CT examination. Spleen: Spleen is enlarged measuring 13.6 x 6.5 x 14.6 cm (estimated splenic volume of 645 mL) . Adrenals/Urinary Tract: There are no abnormal calcifications within the collecting system of either kidney, along the  course of either ureter, or within the lumen of the urinary bladder. No hydroureteronephrosis or perinephric stranding to suggest urinary tract obstruction at this time. The unenhanced appearance of the kidneys is unremarkable bilaterally. Unenhanced appearance of the urinary bladder is normal. Bilateral adrenal glands are normal in appearance. Stomach/Bowel: Unenhanced appearance of the stomach is normal. No pathologic dilatation of small bowel or colon. The appendix is not confidently identified and may be surgically absent. Regardless, there are no inflammatory changes noted adjacent to the cecum to suggest the presence of an acute appendicitis at this time. Vascular/Lymphatic: No atherosclerotic calcifications are noted in the abdominal aorta or pelvic vasculature. No lymphadenopathy noted in the abdomen or pelvis. Reproductive: Unenhanced appearance of the uterus and ovaries is unremarkable. Other: No significant volume of ascites.  No pneumoperitoneum. Musculoskeletal: There are no aggressive appearing lytic or blastic lesions noted in the visualized portions of the skeleton. IMPRESSION: 1. No acute findings are noted in the abdomen or pelvis to account for the Krystal Valdez's symptoms. 2. Splenomegaly. Electronically Signed   By: Trudie Reed M.D.   On: 01/10/2022 06:34        Scheduled Meds:  enoxaparin (LOVENOX) injection  40 mg Subcutaneous Q24H   nicotine  21 mg Transdermal Daily   senna-docusate  1 tablet Oral BID   sodium chloride flush  3 mL Intravenous Q12H   Continuous Infusions:  sodium chloride      ceFAZolin (ANCEF) IV 2 g (01/11/22 1732)     LOS: 1 day    Time spent: 40 minutes    Ramiro Harvest, MD [facility]   To contact the attending provider between 7A-7P or the covering provider during after hours 7P-7A, please log into the web site [email or web address] and access using universal [place] password for that web site. If you do not have the password, please call  the hospital operator.  01/11/2022, 5:52 PM

## 2022-01-11 NOTE — Progress Notes (Signed)
PHARMACY - PHYSICIAN COMMUNICATION CRITICAL VALUE ALERT - BLOOD CULTURE IDENTIFICATION (BCID)  Krystal Valdez is an 27 y.o. female who presented to [facility] on 01/10/2022 with a chief complaint of lumbar back pain.  Assessment:  1/4 MSSA  likely 2/2 to IVDU  Name of physician (or Provider) Contacted: Chinita Greenland, NP  Current antibiotics: none; pt received cefepime x1 in the ED and Vancomycin x1 in the ED but provider is holding off on further antibiotics in preparation of needle aspiration  Changes to prescribed antibiotics recommended:  Patient is on recommended antibiotics - No changes needed  Results for orders placed or performed during the hospital encounter of 01/10/22  Blood Culture ID Panel (Reflexed) (Collected: 01/10/2022  5:12 AM)  Result Value Ref Range   Enterococcus faecalis NOT DETECTED NOT DETECTED   Enterococcus Faecium NOT DETECTED NOT DETECTED   Listeria monocytogenes NOT DETECTED NOT DETECTED   Staphylococcus species DETECTED (A) NOT DETECTED   Staphylococcus aureus (BCID) DETECTED (A) NOT DETECTED   Staphylococcus epidermidis NOT DETECTED NOT DETECTED   Staphylococcus lugdunensis NOT DETECTED NOT DETECTED   Streptococcus species NOT DETECTED NOT DETECTED   Streptococcus agalactiae NOT DETECTED NOT DETECTED   Streptococcus pneumoniae NOT DETECTED NOT DETECTED   Streptococcus pyogenes NOT DETECTED NOT DETECTED   A.calcoaceticus-baumannii NOT DETECTED NOT DETECTED   Bacteroides fragilis NOT DETECTED NOT DETECTED   Enterobacterales NOT DETECTED NOT DETECTED   Enterobacter cloacae complex NOT DETECTED NOT DETECTED   Escherichia coli NOT DETECTED NOT DETECTED   Klebsiella aerogenes NOT DETECTED NOT DETECTED   Klebsiella oxytoca NOT DETECTED NOT DETECTED   Klebsiella pneumoniae NOT DETECTED NOT DETECTED   Proteus species NOT DETECTED NOT DETECTED   Salmonella species NOT DETECTED NOT DETECTED   Serratia marcescens NOT DETECTED NOT DETECTED   Haemophilus  influenzae NOT DETECTED NOT DETECTED   Neisseria meningitidis NOT DETECTED NOT DETECTED   Pseudomonas aeruginosa NOT DETECTED NOT DETECTED   Stenotrophomonas maltophilia NOT DETECTED NOT DETECTED   Candida albicans NOT DETECTED NOT DETECTED   Candida auris NOT DETECTED NOT DETECTED   Candida glabrata NOT DETECTED NOT DETECTED   Candida krusei NOT DETECTED NOT DETECTED   Candida parapsilosis NOT DETECTED NOT DETECTED   Candida tropicalis NOT DETECTED NOT DETECTED   Cryptococcus neoformans/gattii NOT DETECTED NOT DETECTED   Meth resistant mecA/C and MREJ NOT DETECTED NOT DETECTED    Krystal Valdez 01/11/2022  1:33 AM

## 2022-01-12 DIAGNOSIS — D509 Iron deficiency anemia, unspecified: Secondary | ICD-10-CM

## 2022-01-12 DIAGNOSIS — R7881 Bacteremia: Secondary | ICD-10-CM | POA: Diagnosis not present

## 2022-01-12 DIAGNOSIS — A4101 Sepsis due to Methicillin susceptible Staphylococcus aureus: Secondary | ICD-10-CM | POA: Diagnosis not present

## 2022-01-12 DIAGNOSIS — B9561 Methicillin susceptible Staphylococcus aureus infection as the cause of diseases classified elsewhere: Secondary | ICD-10-CM | POA: Diagnosis not present

## 2022-01-12 DIAGNOSIS — E876 Hypokalemia: Secondary | ICD-10-CM

## 2022-01-12 DIAGNOSIS — M4656 Other infective spondylopathies, lumbar region: Secondary | ICD-10-CM | POA: Diagnosis not present

## 2022-01-12 HISTORY — DX: Iron deficiency anemia, unspecified: D50.9

## 2022-01-12 LAB — BASIC METABOLIC PANEL
Anion gap: 11 (ref 5–15)
BUN: 6 mg/dL (ref 6–20)
CO2: 23 mmol/L (ref 22–32)
Calcium: 8.8 mg/dL — ABNORMAL LOW (ref 8.9–10.3)
Chloride: 100 mmol/L (ref 98–111)
Creatinine, Ser: 0.58 mg/dL (ref 0.44–1.00)
GFR, Estimated: >60 mL/min (ref >60)
Glucose, Bld: 101 mg/dL — ABNORMAL HIGH (ref 70–99)
Potassium: 3.1 mmol/L — ABNORMAL LOW (ref 3.5–5.1)
Sodium: 134 mmol/L — ABNORMAL LOW (ref 135–145)

## 2022-01-12 LAB — CULTURE, BLOOD (ROUTINE X 2)

## 2022-01-12 LAB — CBC WITH DIFFERENTIAL/PLATELET
Abs Immature Granulocytes: 0.02 10*3/uL (ref 0.00–0.07)
Basophils Absolute: 0 10*3/uL (ref 0.0–0.1)
Basophils Relative: 1 %
Eosinophils Absolute: 0.1 10*3/uL (ref 0.0–0.5)
Eosinophils Relative: 1 %
HCT: 30.1 % — ABNORMAL LOW (ref 36.0–46.0)
Hemoglobin: 10 g/dL — ABNORMAL LOW (ref 12.0–15.0)
Immature Granulocytes: 1 %
Lymphocytes Relative: 20 %
Lymphs Abs: 0.9 10*3/uL (ref 0.7–4.0)
MCH: 25.9 pg — ABNORMAL LOW (ref 26.0–34.0)
MCHC: 33.2 g/dL (ref 30.0–36.0)
MCV: 78 fL — ABNORMAL LOW (ref 80.0–100.0)
Monocytes Absolute: 0.5 10*3/uL (ref 0.1–1.0)
Monocytes Relative: 10 %
Neutro Abs: 3 10*3/uL (ref 1.7–7.7)
Neutrophils Relative %: 67 %
Platelets: 285 10*3/uL (ref 150–400)
RBC: 3.86 MIL/uL — ABNORMAL LOW (ref 3.87–5.11)
RDW: 13.6 % (ref 11.5–15.5)
WBC: 4.4 10*3/uL (ref 4.0–10.5)
nRBC: 0 % (ref 0.0–0.2)

## 2022-01-12 LAB — HCV RT-PCR, QUANT (NON-GRAPH)
HCV log10: 1.477 log10 IU/mL
Hepatitis C Quantitation: 30 IU/mL

## 2022-01-12 LAB — HCV AB W REFLEX TO QUANT PCR: HCV Ab: REACTIVE — AB

## 2022-01-12 MED ORDER — POTASSIUM CHLORIDE CRYS ER 20 MEQ PO TBCR
30.0000 meq | EXTENDED_RELEASE_TABLET | ORAL | Status: AC
  Administered 2022-01-12 (×2): 30 meq via ORAL
  Filled 2022-01-12 (×2): qty 1

## 2022-01-12 MED ORDER — DEXTROSE 5 % IV SOLN
500.0000 mg | Freq: Four times a day (QID) | INTRAVENOUS | Status: DC | PRN
  Administered 2022-01-12 – 2022-01-18 (×7): 500 mg via INTRAVENOUS
  Filled 2022-01-12: qty 5
  Filled 2022-01-12 (×2): qty 500
  Filled 2022-01-12 (×3): qty 5
  Filled 2022-01-12: qty 500
  Filled 2022-01-12 (×2): qty 5

## 2022-01-12 MED ORDER — HYDROXYZINE HCL 25 MG PO TABS
50.0000 mg | ORAL_TABLET | Freq: Four times a day (QID) | ORAL | Status: AC | PRN
  Administered 2022-01-12 – 2022-01-16 (×12): 50 mg via ORAL
  Filled 2022-01-12 (×12): qty 2

## 2022-01-12 NOTE — Progress Notes (Signed)
[facility]  Date of Admission:  01/10/2022           Reason for visit: Follow up on MSSA bacteremia  Current antibiotics: Cefazolin   ASSESSMENT:    26 y.o. female admitted with:  MSSA bacteremia: Complicated by L2-3 septic arthritis and myositis and probable 10x1mm abscess at L3 (too small for IR aspiration).   Repeat blood cultures drawn 5/31.  TTE was negative for vegetation. 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  HCV pending. Sepsis: Due to #1 and improved.  RECOMMENDATIONS:    Continue cefazolin  TEE Follow repeat blood cultures Lab monitoring Discussed with TRH Will follow   Principal Problem:   Septic arthritis of lumbar spine (HCC) Active Problems:   Drug abuse and dependence (HCC)   Hematuria   Staphylococcus aureus bacteremia   UTI (urinary tract infection)   Septic arthritis of vertebra (HCC)   Hypokalemia   Microcytic anemia    MEDICATIONS:    Scheduled Meds:  enoxaparin (LOVENOX) injection  40 mg Subcutaneous Q24H   nicotine  21 mg Transdermal Daily   potassium chloride  30 mEq Oral Q4H   senna-docusate  1 tablet Oral BID   sodium chloride flush  3 mL Intravenous Q12H   Continuous Infusions:  sodium chloride      ceFAZolin (ANCEF) IV 2 g (01/12/22 0943)   PRN Meds:.sodium chloride, acetaminophen **OR** acetaminophen, dicyclomine, hydrOXYzine, ketorolac, loperamide, methocarbamol, [START ON 01/16/2022] naproxen, ondansetron **OR** ondansetron (ZOFRAN) IV, ondansetron, oxyCODONE, polyethylene glycol, sodium chloride flush  SUBJECTIVE:   24 hour events:  She is having worse pain and anxiety today and asking for this to be better managed. No fevers, chills Tolerating abx Discussed need for TEE   Review of Systems  All other systems reviewed and are negative.    OBJECTIVE:   Blood pressure (!) 112/59, pulse 79, temperature 98.7 F (37.1 C), temperature source Oral, resp. rate 18, height 5\' 7"  (1.702 m),  SpO2 100 %. Body mass index is 22.71 kg/m.  Physical Exam Constitutional:      General: She is not in acute distress.    Appearance: Normal appearance.  HENT:     Head: Normocephalic and atraumatic.  Eyes:     Extraocular Movements: Extraocular movements intact.     Conjunctiva/sclera: Conjunctivae normal.  Cardiovascular:     Rate and Rhythm: Normal rate and regular rhythm.  Pulmonary:     Effort: Pulmonary effort is normal. No respiratory distress.  Abdominal:     General: There is no distension.     Palpations: Abdomen is soft.     Tenderness: There is no abdominal tenderness.  Musculoskeletal:        General: Normal range of motion.     Cervical back: Normal range of motion and neck supple.  Skin:    General: Skin is warm and dry.     Findings: No rash.  Neurological:     General: No focal deficit present.     Mental Status: She is alert and oriented to person, place, and time.  Psychiatric:     Comments: She seems anxious and uncomfortable.      Lab Results: Lab Results  Component Value Date   WBC 4.4 01/12/2022   HGB 10.0 (L) 01/12/2022   HCT 30.1 (L) 01/12/2022   MCV 78.0 (L) 01/12/2022   PLT 285 01/12/2022    Lab Results  Component Value Date   NA 134 (L) 01/12/2022  K 3.1 (L) 01/12/2022   CO2 23 01/12/2022   GLUCOSE 101 (H) 01/12/2022   BUN 6 01/12/2022   CREATININE 0.58 01/12/2022   CALCIUM 8.8 (L) 01/12/2022   GFRNONAA >60 01/12/2022   GFRAA >90 04/04/2013    Lab Results  Component Value Date   ALT 13 01/10/2022   AST 14 (L) 01/10/2022   ALKPHOS 71 01/10/2022   BILITOT 0.5 01/10/2022       Component Value Date/Time   CRP 13.2 (H) 01/10/2022 1350       Component Value Date/Time   ESRSEDRATE 75 (H) 01/10/2022 1350     I have reviewed the micro and lab results in Epic.  Imaging: MR THORACIC SPINE W WO CONTRAST  Result Date: 01/10/2022 CLINICAL DATA:  Lower back pain with infection suspected. IV drug abuse EXAM: MRI THORACIC AND  LUMBAR SPINE WITHOUT AND WITH CONTRAST TECHNIQUE: Multiplanar and multiecho pulse sequences of the thoracic and lumbar spine were obtained without and with intravenous contrast. CONTRAST:  10mL GADAVIST GADOBUTROL 1 MMOL/ML IV SOLN COMPARISON:  None Available. FINDINGS: MRI THORACIC SPINE FINDINGS Alignment:  Physiologic. Vertebrae: No fracture, evidence of discitis, or bone lesion. Cord:  Normal signal and morphology. Paraspinal and other soft tissues: Negative. Disc levels: Diffusely preserved disc height and hydration. No herniation or impingement MRI LUMBAR SPINE FINDINGS Segmentation: Counting sequence on thoracic spine study is nondiagnostic due to motion. Based on the upper most ribs there are 6 lumbar type vertebrae, the lowest numbered S1. Alignment:  Normal Vertebrae: Mild marrow edema around the left L2-3 facet with extensive regional muscular T2 hyperintensity and enhancement. On postcontrast imaging there is collection posterior to the left L3 articular process measuring 10 x 15 mm. Conus medullaris: Extends to the L2-3 level and appears normal. Paraspinal and other soft tissues: Left-sided myositis and periarticular collection as noted above. Disc levels: No degenerative changes or impingement IMPRESSION: Thoracic spine: Negative. Lumbar spine: 1. When numbered from above there is a transitional S1 vertebra. 2. Left-sided intrinsic back muscles myositis with 10 x 15 mm collection posterior to the left L3 articular process. Suspect septic arthritis at L2-3. Electronically Signed   By: Tiburcio Pea M.D.   On: 01/10/2022 11:28   MR Lumbar Spine W Wo Contrast  Result Date: 01/10/2022 CLINICAL DATA:  Lower back pain with infection suspected. IV drug abuse EXAM: MRI THORACIC AND LUMBAR SPINE WITHOUT AND WITH CONTRAST TECHNIQUE: Multiplanar and multiecho pulse sequences of the thoracic and lumbar spine were obtained without and with intravenous contrast. CONTRAST:  54mL GADAVIST GADOBUTROL 1 MMOL/ML IV  SOLN COMPARISON:  None Available. FINDINGS: MRI THORACIC SPINE FINDINGS Alignment:  Physiologic. Vertebrae: No fracture, evidence of discitis, or bone lesion. Cord:  Normal signal and morphology. Paraspinal and other soft tissues: Negative. Disc levels: Diffusely preserved disc height and hydration. No herniation or impingement MRI LUMBAR SPINE FINDINGS Segmentation: Counting sequence on thoracic spine study is nondiagnostic due to motion. Based on the upper most ribs there are 6 lumbar type vertebrae, the lowest numbered S1. Alignment:  Normal Vertebrae: Mild marrow edema around the left L2-3 facet with extensive regional muscular T2 hyperintensity and enhancement. On postcontrast imaging there is collection posterior to the left L3 articular process measuring 10 x 15 mm. Conus medullaris: Extends to the L2-3 level and appears normal. Paraspinal and other soft tissues: Left-sided myositis and periarticular collection as noted above. Disc levels: No degenerative changes or impingement IMPRESSION: Thoracic spine: Negative. Lumbar spine: 1. When numbered from above there  is a transitional S1 vertebra. 2. Left-sided intrinsic back muscles myositis with 10 x 15 mm collection posterior to the left L3 articular process. Suspect septic arthritis at L2-3. Electronically Signed   By: Tiburcio Pea M.D.   On: 01/10/2022 11:28   ECHOCARDIOGRAM COMPLETE  Result Date: 01/11/2022    ECHOCARDIOGRAM REPORT   Patient Name:   Krystal Valdez Date of Exam: 01/11/2022 Medical Rec #:  [id number]       Height:       67.0 in Accession #:    [id number]      Weight:       145.0 lb Date of Birth:  1996-05-04      BSA:          1.764 m Patient Age:    25 years        BP:           125/71 mmHg Patient Gender: F               HR:           91 bpm. Exam Location:  Inpatient Procedure: 2D Echo, Cardiac Doppler and Color Doppler Indications:    Bacteremia  History:        Patient has no prior history of Echocardiogram examinations.   Sonographer:    Cleatis Polka Referring Phys: [id number] DANIEL V THOMPSON IMPRESSIONS  1. Left ventricular ejection fraction, by estimation, is 60 to 65%. The left ventricle has normal function. The left ventricle has no regional wall motion abnormalities. Left ventricular diastolic parameters were normal.  2. Right ventricular systolic function is normal. The right ventricular size is normal.  3. The mitral valve is normal in structure. Mild mitral valve regurgitation.  4. The aortic valve is normal in structure. Aortic valve regurgitation is not visualized.  5. The inferior vena cava is normal in size with greater than 50% respiratory variability, suggesting right atrial pressure of 3 mmHg. FINDINGS  Left Ventricle: Left ventricular ejection fraction, by estimation, is 60 to 65%. The left ventricle has normal function. The left ventricle has no regional wall motion abnormalities. The left ventricular internal cavity size was normal in size. There is  no left ventricular hypertrophy. Left ventricular diastolic parameters were normal. Right Ventricle: The right ventricular size is normal. Right vetricular wall thickness was not assessed. Right ventricular systolic function is normal. Left Atrium: Left atrial size was normal in size. Right Atrium: Right atrial size was normal in size. Pericardium: There is no evidence of pericardial effusion. Mitral Valve: The mitral valve is normal in structure. Mild mitral valve regurgitation. Tricuspid Valve: The tricuspid valve is normal in structure. Tricuspid valve regurgitation is trivial. Aortic Valve: The aortic valve is normal in structure. Aortic valve regurgitation is not visualized. Aortic valve peak gradient measures 7.7 mmHg. Pulmonic Valve: The pulmonic valve was not well visualized. Pulmonic valve regurgitation is mild. No evidence of pulmonic stenosis. Aorta: The aortic root and ascending aorta are structurally normal, with no evidence of dilitation. Venous: The inferior  vena cava is normal in size with greater than 50% respiratory variability, suggesting right atrial pressure of 3 mmHg. IAS/Shunts: No atrial level shunt detected by color flow Doppler.  LEFT VENTRICLE PLAX 2D LVIDd:         4.50 cm      Diastology LVIDs:         3.30 cm      LV e' medial:    9.68 cm/s LV PW:  1.00 cm      LV E/e' medial:  10.7 LV IVS:        0.80 cm      LV e' lateral:   11.60 cm/s LVOT diam:     2.00 cm      LV E/e' lateral: 9.0 LV SV:         72 LV SV Index:   41 LVOT Area:     3.14 cm  LV Volumes (MOD) LV vol d, MOD A2C: 109.0 ml LV vol d, MOD A4C: 102.0 ml LV vol s, MOD A2C: 37.3 ml LV vol s, MOD A4C: 36.8 ml LV SV MOD A2C:     71.7 ml LV SV MOD A4C:     102.0 ml LV SV MOD BP:      69.5 ml RIGHT VENTRICLE             IVC RV Basal diam:  2.80 cm     IVC diam: 1.90 cm RV Mid diam:    2.80 cm RV S prime:     15.40 cm/s TAPSE (M-mode): 2.4 cm LEFT ATRIUM             Index        RIGHT ATRIUM          Index LA diam:        3.00 cm 1.70 cm/m   RA Area:     9.61 cm LA Vol (A2C):   30.4 ml 17.23 ml/m  RA Volume:   19.00 ml 10.77 ml/m LA Vol (A4C):   44.0 ml 24.94 ml/m LA Biplane Vol: 38.0 ml 21.54 ml/m  AORTIC VALVE AV Area (Vmax): 2.85 cm AV Vmax:        139.00 cm/s AV Peak Grad:   7.7 mmHg LVOT Vmax:      126.00 cm/s LVOT Vmean:     83.500 cm/s LVOT VTI:       0.229 m  AORTA Ao Root diam: 2.90 cm Ao Asc diam:  2.40 cm MITRAL VALVE MV Area (PHT): 5.75 cm     SHUNTS MV Decel Time: 132 msec     Systemic VTI:  0.23 m MV E velocity: 104.00 cm/s  Systemic Diam: 2.00 cm MV A velocity: 104.00 cm/s MV E/A ratio:  1.00 Dietrich PatesPaula Ross MD Electronically signed by Dietrich PatesPaula Ross MD Signature Date/Time: 01/11/2022/12:07:39 PM    Final      Imaging independently reviewed in Epic.    Vedia CofferAndrew N Kellen [facility] [phone] pager 01/12/2022, 10:24 AM

## 2022-01-12 NOTE — Progress Notes (Signed)
Pt is in/out of pain. Asked to come back to answer admission questions.

## 2022-01-12 NOTE — Progress Notes (Signed)
PROGRESS NOTE Krystal Valdez  ZOX:[id number] DOB: 12-20-1995 DOA: 01/10/2022 PCP: Pcp, No   Brief Narrative/Hospital Course: 26 year old female history of IV drug use, mood disorder presented with 3-day history of lower back pain, work-up showed leukocytosis and elevated sed rate MRI T and L-spine concerning for septic arthritis at L2 -L3 and  10x15 mm fluid collection. Neurosurg rec IR and ID consult. IR consulted for aspiration and too small for aspiration.  ID consulted and helping with antibiotic management.  2D echo ordered no acute finding,    Subjective: Seen and examined this morning.  Resting comfortably, some pain on the back moving lower extremities well no bowel bladder incontinence.   Assessment and Plan: Principal Problem:   Septic arthritis of lumbar spine (HCC) Active Problems:   Staphylococcus aureus bacteremia   Drug abuse and dependence (HCC)   Hematuria   UTI (urinary tract infection)   Septic arthritis of vertebra (HCC)   Hypokalemia   Microcytic anemia   Septic arthritis of lumbar spine L2 -L3 and probably 10x15 mm fluid collection and myositis: MSSA bacteremia: Continue Ancef per ID.  Repeat blood culture 5/31, follow-up hepatitis panel, RPR.  TTE EF 60 to 65%, mitral valve normal aortic valve normal.  ID recommending TEE, I have sent message to cardiology.  IVDA: Patient admits to taking heroin and methamphetamine.  Cessation has been discussed.  TOC consult, continue clonidine detox protocol, counseling.  Minimize opiates, continue increased dose Atarax  iv Toradol , IV Robaxin for further anxiety and pain management  UTI continue antibiotics as above.  Hypokalemia: Replete orally Microcytic anemia hemoglobin stable in 10 g.  Monitor  I requested patient to stay in the hospital to complete IV antibiotics until she is cleared for discharge, advised not to leave AGAINST MEDICAL ADVICE as it may lead to further worsening of infection that could be  disabling  leading to paraplegia or even death.  Patient verbalized understanding  DVT prophylaxis: enoxaparin (LOVENOX) injection 40 mg Start: 01/10/22 2030 Code Status:   Code Status: Full Code Family Communication: plan of care discussed with patient at bedside. Patient status is: Inpatient because of further work-up and to rule out endocarditis Level of care: Med-Surg   Dispo: The patient is from: Home            Anticipated disposition: Home after cleared by ID  Mobility Assessment (last 72 hours)     Mobility Assessment     Row Name 01/11/22 1950 01/10/22 2030         Does patient have an order for bedrest or is patient medically unstable No - Continue assessment No - Continue assessment      What is the highest level of mobility based on the progressive mobility assessment? Level 4 (Walks with assist in room) - Balance while marching in place and cannot step forward and back - Complete Level 3 (Stands with assist) - Balance while standing  and cannot march in place      Is the above level different from baseline mobility prior to current illness? No - Consider discontinuing PT/OT No - Consider discontinuing PT/OT                Objective: Vitals last 24 hrs: Vitals:   01/11/22 1550 01/11/22 1833 01/11/22 1834 01/12/22 0613  BP:   118/67 (!) 112/59  Pulse:   (!) 105 79  Resp: Temp:   98.7 F (37.1 C) 98.7 F (37.1 C)  TempSrc:  Oral Oral  SpO2:   100% 100%  Height:       Weight change:   Physical Examination: General exam: alert awake,older than stated age, weak appearing. HEENT:Oral mucosa moist, Ear/Nose WNL grossly, dentition normal. Respiratory system: bilaterally diminished BS, no use of accessory muscle Cardiovascular system: S1 & S2 +, No JVD. Gastrointestinal system: Abdomen soft,NT,ND, BS+ Nervous System:Alert, awake, moving extremities and grossly nonfocal Extremities: LE edema none,distal peripheral pulses palpable.  Skin: No rashes,no  icterus. MSK: Normal muscle bulk,tone, power  Medications reviewed:  Scheduled Meds:  enoxaparin (LOVENOX) injection  40 mg Subcutaneous Q24H   nicotine  21 mg Transdermal Daily   potassium chloride  30 mEq Oral Q4H   senna-docusate  1 tablet Oral BID   sodium chloride flush  3 mL Intravenous Q12H   Continuous Infusions:  sodium chloride      ceFAZolin (ANCEF) IV 2 g (01/12/22 0943)   methocarbamol (ROBAXIN) IV        Diet Order             Diet regular Room service appropriate? Yes; Fluid consistency: Thin  Diet effective now                            Intake/Output Summary (Last 24 hours) at 01/12/2022 1121 Last data filed at 01/12/2022 0600 Gross per 24 hour  Intake 778.98 ml  Output --  Net 778.98 ml   Net IO Since Admission: 1,498.98 mL [01/12/22 1121]  Wt Readings from Last 3 Encounters:  08/04/16 65.8 kg  06/29/16 68 kg  05/12/16 68.5 kg     Unresulted Labs (From admission, onward)     Start     Ordered   01/11/22 1027  HCV Ab w Reflex to Quant PCR  Once,   R       Question:  Specimen collection method  Answer:  Lab=Lab collect   01/11/22 1026          Data Reviewed: I have personally reviewed following labs and imaging studies CBC: Recent Labs  Lab 01/10/22 0512 01/11/22 0412 01/12/22 0555  WBC 10.7* 5.7 4.4  NEUTROABS 8.1*  --  3.0  HGB 11.1* 10.4* 10.0*  HCT 34.2* 30.2* 30.1*  MCV 79.9* 76.8* 78.0*  PLT 330 292 285   Basic Metabolic Panel: Recent Labs  Lab 01/10/22 0512 01/11/22 0412 01/12/22 0555  NA 136 131* 134*  K 3.8 3.3* 3.1*  CL 100 98 100  CO2 27 23 23   GLUCOSE 94 114* 101*  BUN 12 7 6   CREATININE 0.49 0.50 0.58  CALCIUM 9.2 8.9 8.8*  MG  --  2.1  --    GFR: CrCl cannot be calculated (Unknown ideal weight.). Liver Function Tests: Recent Labs  Lab 01/10/22 0512  AST 14*  ALT 13  ALKPHOS 71  BILITOT 0.5  PROT 8.2*  ALBUMIN 3.8   No results for input(s): LIPASE, AMYLASE in the last 168 hours. No  results for input(s): AMMONIA in the last 168 hours. Coagulation Profile: No results for input(s): INR, PROTIME in the last 168 hours. BNP (last 3 results) No results for input(s): PROBNP in the last 8760 hours. HbA1C: No results for input(s): HGBA1C in the last 72 hours. CBG: No results for input(s): GLUCAP in the last 168 hours. Lipid Profile: No results for input(s): CHOL, HDL, LDLCALC, TRIG, CHOLHDL, LDLDIRECT in the last 72 hours. Thyroid Function Tests: No results for input(s): TSH, T4TOTAL,  FREET4, T3FREE, THYROIDAB in the last 72 hours. Sepsis Labs: Recent Labs  Lab 01/10/22 1610  LATICACIDVEN 1.0    Recent Results (from the past 240 hour(s))  Blood culture (routine x 2)     Status: Abnormal   Collection Time: 01/10/22  5:12 AM   Specimen: BLOOD  Result Value Ref Range Status   Specimen Description   Final    BLOOD LEFT ANTECUBITAL Performed at [facility], [address] [place], [place] [address]    Special Requests   Final    BOTTLES DRAWN AEROBIC AND ANAEROBIC Blood Culture results may not be optimal due to an inadequate volume of blood received in culture bottles Performed at [facility], [address], [place], [place] [address]    Culture  Setup Time   Final    GRAM POSITIVE COCCI AEROBIC BOTTLE ONLY CRITICAL RESULT CALLED TO, READ BACK BY AND VERIFIED WITH: PHARMD TONY  RUDISILL ON 01/10/22 @ 2309 BY DRT Performed at [facility], [address] [place], [place] [address]    Culture STAPHYLOCOCCUS AUREUS (A)  Final   Report Status 01/12/2022 FINAL  Final   Organism ID, Bacteria STAPHYLOCOCCUS AUREUS  Final      Susceptibility   Staphylococcus aureus - MIC*    CIPROFLOXACIN <=0.5 SENSITIVE Sensitive     ERYTHROMYCIN <=0.25 SENSITIVE Sensitive     GENTAMICIN <=0.5 SENSITIVE Sensitive     OXACILLIN 0.5 SENSITIVE Sensitive     TETRACYCLINE <=1 SENSITIVE Sensitive     VANCOMYCIN 1 SENSITIVE Sensitive      TRIMETH/SULFA <=10 SENSITIVE Sensitive     CLINDAMYCIN <=0.25 SENSITIVE Sensitive     RIFAMPIN <=0.5 SENSITIVE Sensitive     Inducible Clindamycin NEGATIVE Sensitive     * STAPHYLOCOCCUS AUREUS  Blood Culture ID Panel (Reflexed)     Status: Abnormal   Collection Time: 01/10/22  5:12 AM  Result Value Ref Range Status   Enterococcus faecalis NOT DETECTED NOT DETECTED Final   Enterococcus Faecium NOT DETECTED NOT DETECTED Final   Listeria monocytogenes NOT DETECTED NOT DETECTED Final   Staphylococcus species DETECTED (A) NOT DETECTED Final    Comment: CRITICAL RESULT CALLED TO, READ BACK BY AND VERIFIED WITH: PHARMD TONY  RUDISILL ON 01/10/22 @ 2309 BY DRT    Staphylococcus aureus (BCID) DETECTED (A) NOT DETECTED Final    Comment: CRITICAL RESULT CALLED TO, READ BACK BY AND VERIFIED WITH: PHARMD TONY  RUDISILL ON 01/10/22 @ 2309 BY DRT    Staphylococcus epidermidis NOT DETECTED NOT DETECTED Final   Staphylococcus lugdunensis NOT DETECTED NOT DETECTED Final   Streptococcus species NOT DETECTED NOT DETECTED Final   Streptococcus agalactiae NOT DETECTED NOT DETECTED Final   Streptococcus pneumoniae NOT DETECTED NOT DETECTED Final   Streptococcus pyogenes NOT DETECTED NOT DETECTED Final   A.calcoaceticus-baumannii NOT DETECTED NOT DETECTED Final   Bacteroides fragilis NOT DETECTED NOT DETECTED Final   Enterobacterales NOT DETECTED NOT DETECTED Final   Enterobacter cloacae complex NOT DETECTED NOT DETECTED Final   Escherichia coli NOT DETECTED NOT DETECTED Final   Klebsiella aerogenes NOT DETECTED NOT DETECTED Final   Klebsiella oxytoca NOT DETECTED NOT DETECTED Final   Klebsiella pneumoniae NOT DETECTED NOT DETECTED Final   Proteus species NOT DETECTED NOT DETECTED Final   Salmonella species NOT DETECTED NOT DETECTED Final   Serratia marcescens NOT DETECTED NOT DETECTED Final   Haemophilus influenzae NOT DETECTED NOT DETECTED Final   Neisseria meningitidis NOT DETECTED NOT DETECTED  Final    Pseudomonas aeruginosa NOT DETECTED NOT DETECTED Final   Stenotrophomonas maltophilia NOT DETECTED NOT DETECTED Final   Candida albicans NOT DETECTED NOT DETECTED Final   Candida auris NOT DETECTED NOT DETECTED Final   Candida glabrata NOT DETECTED NOT DETECTED Final   Candida krusei NOT DETECTED NOT DETECTED Final   Candida parapsilosis NOT DETECTED NOT DETECTED Final   Candida tropicalis NOT DETECTED NOT DETECTED Final   Cryptococcus neoformans/gattii NOT DETECTED NOT DETECTED Final   Meth resistant mecA/C and MREJ NOT DETECTED NOT DETECTED Final    Comment: Performed at [facility], [address] [place], [place] [address]  Blood culture (routine x 2)     Status: None (Preliminary result)   Collection Time: 01/10/22  5:24 AM   Specimen: BLOOD  Result Value Ref Range Status   Specimen Description   Final    BLOOD RIGHT ANTECUBITAL Performed at [facility], [address], [place], [place] [address]    Special Requests   Final    BOTTLES DRAWN AEROBIC AND ANAEROBIC Blood Culture adequate volume Performed at [facility], [address] [place], [place] [address]    Culture   Final    NO GROWTH 2 DAYS Performed at [facility], [address], [place], [place] [address]    Report Status PENDING  Incomplete  Culture, blood (Routine X 2) w Reflex to ID Panel     Status: None (Preliminary result)   Collection Time: 01/12/22  5:55 AM   Specimen: BLOOD LEFT HAND  Result Value Ref Range Status   Specimen Description BLOOD LEFT HAND  Final   Special Requests   Final    BOTTLES DRAWN AEROBIC AND ANAEROBIC Blood Culture adequate volume   Culture   Final    NO GROWTH < 12 HOURS Performed at [facility], [address] [place], [place] [address]    Report Status PENDING  Incomplete  Culture, blood (Routine X 2) w Reflex to ID Panel     Status: None (Preliminary result)   Collection Time: 01/12/22  5:55 AM   Specimen:  BLOOD RIGHT HAND  Result Value Ref Range Status   Specimen Description BLOOD RIGHT HAND  Final   Special Requests   Final    BOTTLES DRAWN AEROBIC AND ANAEROBIC Blood Culture adequate volume   Culture   Final    NO GROWTH < 12 HOURS Performed at [facility], [address] [place], [place] [address]    Report Status PENDING  Incomplete    Antimicrobials: Anti-infectives (From admission, onward)    Start     Dose/Rate Route Frequency Ordered Stop   01/11/22 0930  ceFAZolin (ANCEF) IVPB 2g/100 mL premix        2 g 200 mL/hr over 30 Minutes Intravenous Every 8 hours 01/11/22 0831     01/10/22 1345  ceFEPIme (MAXIPIME) 2 g in sodium chloride 0.9 % 100 mL IVPB        2 g 200 mL/hr over 30 Minutes Intravenous  Once 01/10/22 1330 01/10/22 1423   01/10/22 1345  vancomycin (VANCOCIN) IVPB 1000 mg/200 mL premix        1,000 mg 200 mL/hr over 60 Minutes Intravenous  Once 01/10/22 1343 01/10/22 1601   01/10/22 1145  ceFEPIme (MAXIPIME) 2 g in sodium chloride 0.9 % 100 mL IVPB  Status:  Discontinued        2 g 200 mL/hr  over 30 Minutes Intravenous  Once 01/10/22 1143 01/10/22 1146      Culture/Microbiology    Component Value Date/Time   SDES BLOOD LEFT HAND 01/12/2022 0555   SDES BLOOD RIGHT HAND 01/12/2022 0555   SPECREQUEST  01/12/2022 0555    BOTTLES DRAWN AEROBIC AND ANAEROBIC Blood Culture adequate volume   SPECREQUEST  01/12/2022 0555    BOTTLES DRAWN AEROBIC AND ANAEROBIC Blood Culture adequate volume   CULT  01/12/2022 0555    NO GROWTH < 12 HOURS Performed at [facility], [address], [place], [place] [address]    CULT  01/12/2022 0555    NO GROWTH < 12 HOURS Performed at [facility], [address], [place], [place] [address]    REPTSTATUS PENDING 01/12/2022 0555   REPTSTATUS PENDING 01/12/2022 0555    Other culture-see note  Radiology Studies: ECHOCARDIOGRAM COMPLETE  Result Date: 01/11/2022    ECHOCARDIOGRAM REPORT   Patient Name:   CHARLYE SPARE Date of Exam: 01/11/2022 Medical Rec #:  [id number]       Height:       67.0 in Accession #:    [id number]      Weight:       145.0 lb Date of Birth:  1996/04/03      BSA:          1.764 m Patient Age:    25 years        BP:           125/71 mmHg Patient Gender: F               HR:           91 bpm. Exam Location:  Inpatient Procedure: 2D Echo, Cardiac Doppler and Color Doppler Indications:    Bacteremia  History:        Patient has no prior history of Echocardiogram examinations.  Sonographer:    Cleatis Polka Referring Phys: [id number] DANIEL V THOMPSON IMPRESSIONS  1. Left ventricular ejection fraction, by estimation, is 60 to 65%. The left ventricle has normal function. The left ventricle has no regional wall motion abnormalities. Left ventricular diastolic parameters were normal.  2. Right ventricular systolic function is normal. The right ventricular size is normal.  3. The mitral valve is normal in structure. Mild mitral valve regurgitation.  4. The aortic valve is normal in structure. Aortic valve regurgitation is not visualized.  5. The inferior vena cava is normal in size with greater than 50% respiratory variability, suggesting right atrial pressure of 3 mmHg. FINDINGS  Left Ventricle: Left ventricular ejection fraction, by estimation, is 60 to 65%. The left ventricle has normal function. The left ventricle has no regional wall motion abnormalities. The left ventricular internal cavity size was normal in size. There is  no left ventricular hypertrophy. Left ventricular diastolic parameters were normal. Right Ventricle: The right ventricular size is normal. Right vetricular wall thickness was not assessed. Right ventricular systolic function is normal. Left Atrium: Left atrial size was normal in size. Right Atrium: Right atrial size was normal in size. Pericardium: There is no evidence of pericardial effusion. Mitral Valve: The mitral valve is normal in structure. Mild mitral valve regurgitation.  Tricuspid Valve: The tricuspid valve is normal in structure. Tricuspid valve regurgitation is trivial. Aortic Valve: The aortic valve is normal in structure. Aortic valve regurgitation is not visualized. Aortic valve peak gradient measures 7.7 mmHg. Pulmonic Valve: The pulmonic valve was not well visualized. Pulmonic  valve regurgitation is mild. No evidence of pulmonic stenosis. Aorta: The aortic root and ascending aorta are structurally normal, with no evidence of dilitation. Venous: The inferior vena cava is normal in size with greater than 50% respiratory variability, suggesting right atrial pressure of 3 mmHg. IAS/Shunts: No atrial level shunt detected by color flow Doppler.  LEFT VENTRICLE PLAX 2D LVIDd:         4.50 cm      Diastology LVIDs:         3.30 cm      LV e' medial:    9.68 cm/s LV PW:         1.00 cm      LV E/e' medial:  10.7 LV IVS:        0.80 cm      LV e' lateral:   11.60 cm/s LVOT diam:     2.00 cm      LV E/e' lateral: 9.0 LV SV:         72 LV SV Index:   41 LVOT Area:     3.14 cm  LV Volumes (MOD) LV vol d, MOD A2C: 109.0 ml LV vol d, MOD A4C: 102.0 ml LV vol s, MOD A2C: 37.3 ml LV vol s, MOD A4C: 36.8 ml LV SV MOD A2C:     71.7 ml LV SV MOD A4C:     102.0 ml LV SV MOD BP:      69.5 ml RIGHT VENTRICLE             IVC RV Basal diam:  2.80 cm     IVC diam: 1.90 cm RV Mid diam:    2.80 cm RV S prime:     15.40 cm/s TAPSE (M-mode): 2.4 cm LEFT ATRIUM             Index        RIGHT ATRIUM          Index LA diam:        3.00 cm 1.70 cm/m   RA Area:     9.61 cm LA Vol (A2C):   30.4 ml 17.23 ml/m  RA Volume:   19.00 ml 10.77 ml/m LA Vol (A4C):   44.0 ml 24.94 ml/m LA Biplane Vol: 38.0 ml 21.54 ml/m  AORTIC VALVE AV Area (Vmax): 2.85 cm AV Vmax:        139.00 cm/s AV Peak Grad:   7.7 mmHg LVOT Vmax:      126.00 cm/s LVOT Vmean:     83.500 cm/s LVOT VTI:       0.229 m  AORTA Ao Root diam: 2.90 cm Ao Asc diam:  2.40 cm MITRAL VALVE MV Area (PHT): 5.75 cm     SHUNTS MV Decel Time: 132 msec      Systemic VTI:  0.23 m MV E velocity: 104.00 cm/s  Systemic Diam: 2.00 cm MV A velocity: 104.00 cm/s MV E/A ratio:  1.00 Dietrich Pates MD Electronically signed by Dietrich Pates MD Signature Date/Time: 01/11/2022/12:07:39 PM    Final      LOS: 2 days   Lanae Boast, MD [facility]  01/12/2022, 11:21 AM

## 2022-01-12 NOTE — Hospital Course (Addendum)
26 year old female history of IV drug use, mood disorder presented with 3-day history of lower back pain, work-up showed leukocytosis and elevated sed rate MRI T and L-spine concerning for septic arthritis at L2 -L3 and  10x15 mm fluid collection. Neurosurg rec IR and ID consult. IR consulted for aspiration and too small for aspiration.  ID consulted and helping with antibiotic management.  2D echo ordered no acute finding.  Patient subsequently underwent TEE EF 60 to 65%, no LA/LAA thrombus or masses and no endocarditis noted trivial MR and trivial TR.  Patient remains hospitalized for ongoing IV antibiotics. Found doing vaping in room  6/4- placed on tele sitter.

## 2022-01-12 NOTE — Plan of Care (Signed)

## 2022-01-13 DIAGNOSIS — M4656 Other infective spondylopathies, lumbar region: Secondary | ICD-10-CM | POA: Diagnosis not present

## 2022-01-13 MED ORDER — LIDOCAINE 5 % EX PTCH
1.0000 | MEDICATED_PATCH | CUTANEOUS | Status: AC
  Administered 2022-01-13: 1 via TRANSDERMAL
  Filled 2022-01-13: qty 1

## 2022-01-13 MED ORDER — MELATONIN 3 MG PO TABS
3.0000 mg | ORAL_TABLET | Freq: Once | ORAL | Status: AC
  Administered 2022-01-13: 3 mg via ORAL
  Filled 2022-01-13: qty 1

## 2022-01-13 MED ORDER — ALPRAZOLAM 0.5 MG PO TABS
0.2500 mg | ORAL_TABLET | Freq: Two times a day (BID) | ORAL | Status: DC | PRN
Start: 2022-01-13 — End: 2022-01-24
  Administered 2022-01-13 – 2022-01-20 (×11): 0.25 mg via ORAL
  Filled 2022-01-13 (×11): qty 1

## 2022-01-13 NOTE — Progress Notes (Signed)
PROGRESS NOTE K[place]  RFF:[id number] DOB: 1996/04/12 DOA: 01/10/2022 PCP: Pcp, No   Brief Narrative/Hospital Course: 26 year old female history of IV drug use, mood disorder presented with 3-day history of lower back pain, work-up showed leukocytosis and elevated sed rate MRI T and L-spine concerning for septic arthritis at L2 -L3 and  10x15 mm fluid collection. Neurosurg rec IR and ID consult. IR consulted for aspiration and too small for aspiration.  ID consulted and helping with antibiotic management.  2D echo ordered no acute finding,    Subjective: Seen and examined this morning.  Overnight afebrile This morning complains of nausea no other new complaints. Discussed about minimizing narcotics especially IV.  Assessment and Plan: Principal Problem:   Septic arthritis of lumbar spine (HCC) Active Problems:   Staphylococcus aureus bacteremia   Drug abuse and dependence (HCC)   Hematuria   UTI (urinary tract infection)   Septic arthritis of vertebra (HCC)   Hypokalemia   Microcytic anemia   Septic arthritis of lumbar spine L2 -L3 and probably 10x15 mm fluid collection and myositis: MSSA bacteremia: Continue Ancef per ID.  Repeat blood culture 5/31, follow-up hepatitis panel, RPR.  TTE EF 60 to 65%, mitral valve normal aortic valve normal.  ID recommending TEE-TEE will evaluate Friday, continue antibiotics until then.   IVDA: Patient admits to taking heroin and methamphetamine.  Cessation has been discussed.  TOC consult, continue clonidine detox protocol, counseling.  Minimize opiates, continue increased dose Atarax  iv Toradol , IV Robaxin for further anxiety and pain management.  Add low-dose Xanax.  UTI continue antibiotics as above.  Hypokalemia: Was repleted.  Repeat lab  Microcytic anemia hemoglobin stable in 10 g.  Monitor  I requested patient to stay in the hospital to complete IV antibiotics until she is cleared for discharge, advised not to leave AGAINST MEDICAL  ADVICE as it may lead to further worsening of infection that could be  disabling leading to paraplegia or even death.  Patient verbalized understanding  DVT prophylaxis: enoxaparin (LOVENOX) injection 40 mg Start: 01/10/22 2030 Code Status:   Code Status: Full Code Family Communication: plan of care discussed with patient at bedside. Patient status is: Inpatient because of further work-up and to rule out endocarditis Level of care: Med-Surg   Dispo: The patient is from: Home            Anticipated disposition: Home after cleared by ID  Mobility Assessment (last 72 hours)     Mobility Assessment     Row Name 01/13/22 0835 01/12/22 1950 01/12/22 0750 01/11/22 1950 01/10/22 2030   Does patient have an order for bedrest or is patient medically unstable No - Continue assessment No - Continue assessment No - Continue assessment No - Continue assessment No - Continue assessment   What is the highest level of mobility based on the progressive mobility assessment? Level 6 (Walks independently in room and hall) - Balance while walking in room without assist - Complete Level 5 (Walks with assist in room/hall) - Balance while stepping forward/back and can walk in room with assist - Complete Level 4 (Walks with assist in room) - Balance while marching in place and cannot step forward and back - Complete Level 4 (Walks with assist in room) - Balance while marching in place and cannot step forward and back - Complete Level 3 (Stands with assist) - Balance while standing  and cannot march in place   Is the above level different from baseline mobility prior to  current illness? No - Consider discontinuing PT/OT No - Consider discontinuing PT/OT No - Consider discontinuing PT/OT No - Consider discontinuing PT/OT No - Consider discontinuing PT/OT             Objective: Vitals last 24 hrs: Vitals:   01/12/22 1301 01/12/22 2136 01/13/22 0539 01/13/22 0838  BP: 125/74 106/64 110/74 122/66  Pulse: 83 73 96 84   Resp: 20 18 18 16   Temp: 98.5 F (36.9 C) (!) 97.4 F (36.3 C) (!) 97.2 F (36.2 C) 98 F (36.7 C)  TempSrc: Oral Oral Oral Oral  SpO2: 100% 100% 100% 100%  Height:       Weight change:   Physical Examination: General exam: AAox3,older than stated age, weak appearing. HEENT:Oral mucosa moist, Ear/Nose WNL grossly, dentition normal. Respiratory system: bilaterally diminished,no use of accessory muscle Cardiovascular system: S1 & S2 +, No JVD,. Gastrointestinal system: Abdomen soft,NT,ND, BS+ Nervous System:Alert, awake, moving extremities and grossly nonfocal Extremities: edema neg,distal peripheral pulses palpable.  Skin: No rashes,no icterus. MSK: Normal muscle bulk,tone, power   Medications reviewed:  Scheduled Meds:  enoxaparin (LOVENOX) injection  40 mg Subcutaneous Q24H   lidocaine  1 patch Transdermal Q24H   nicotine  21 mg Transdermal Daily   senna-docusate  1 tablet Oral BID   sodium chloride flush  3 mL Intravenous Q12H   Continuous Infusions:  sodium chloride      ceFAZolin (ANCEF) IV 2 g (01/13/22 0845)   methocarbamol (ROBAXIN) IV 500 mg (01/12/22 2143)      Diet Order             Diet regular Room service appropriate? Yes; Fluid consistency: Thin  Diet effective now                            Intake/Output Summary (Last 24 hours) at 01/13/2022 1014 Last data filed at 01/13/2022 0600 Gross per 24 hour  Intake 640 ml  Output --  Net 640 ml    Net IO Since Admission: 2,138.98 mL [01/13/22 1014]  Wt Readings from Last 3 Encounters:  08/04/16 65.8 kg  06/29/16 68 kg  05/12/16 68.5 kg     Unresulted Labs (From admission, onward)    None     Data Reviewed: I have personally reviewed following labs and imaging studies CBC: Recent Labs  Lab 01/10/22 0512 01/11/22 0412 01/12/22 0555  WBC 10.7* 5.7 4.4  NEUTROABS 8.1*  --  3.0  HGB 11.1* 10.4* 10.0*  HCT 34.2* 30.2* 30.1*  MCV 79.9* 76.8* 78.0*  PLT 330 292 285    Basic  Metabolic Panel: Recent Labs  Lab 01/10/22 0512 01/11/22 0412 01/12/22 0555  NA 136 131* 134*  K 3.8 3.3* 3.1*  CL 100 98 100  CO2 27 23 23   GLUCOSE 94 114* 101*  BUN 12 7 6   CREATININE 0.49 0.50 0.58  CALCIUM 9.2 8.9 8.8*  MG  --  2.1  --     GFR: CrCl cannot be calculated (Unknown ideal weight.). Liver Function Tests: Recent Labs  Lab 01/10/22 0512  AST 14*  ALT 13  ALKPHOS 71  BILITOT 0.5  PROT 8.2*  ALBUMIN 3.8    No results for input(s): LIPASE, AMYLASE in the last 168 hours. No results for input(s): AMMONIA in the last 168 hours. Coagulation Profile: No results for input(s): INR, PROTIME in the last 168 hours. BNP (last 3 results) No results for input(s): PROBNP in the  last 8760 hours. HbA1C: No results for input(s): HGBA1C in the last 72 hours. CBG: No results for input(s): GLUCAP in the last 168 hours. Lipid Profile: No results for input(s): CHOL, HDL, LDLCALC, TRIG, CHOLHDL, LDLDIRECT in the last 72 hours. Thyroid Function Tests: No results for input(s): TSH, T4TOTAL, FREET4, T3FREE, THYROIDAB in the last 72 hours. Sepsis Labs: Recent Labs  Lab 01/10/22 1610  LATICACIDVEN 1.0     Recent Results (from the past 240 hour(s))  Blood culture (routine x 2)     Status: Abnormal   Collection Time: 01/10/22  5:12 AM   Specimen: BLOOD  Result Value Ref Range Status   Specimen Description   Final    BLOOD LEFT ANTECUBITAL Performed at [facility], [address] [place], [place] [address]    Special Requests   Final    BOTTLES DRAWN AEROBIC AND ANAEROBIC Blood Culture results may not be optimal due to an inadequate volume of blood received in culture bottles Performed at [facility], [address] [place], [place] [address]    Culture  Setup Time   Final    GRAM POSITIVE COCCI AEROBIC BOTTLE ONLY CRITICAL RESULT CALLED TO, READ BACK BY AND VERIFIED WITH: PHARMD TONY  RUDISILL ON 01/10/22 @ 2309 BY  DRT Performed at [facility], [address], [place], [place] [address]    Culture STAPHYLOCOCCUS AUREUS (A)  Final   Report Status 01/12/2022 FINAL  Final   Organism ID, Bacteria STAPHYLOCOCCUS AUREUS  Final      Susceptibility   Staphylococcus aureus - MIC*    CIPROFLOXACIN <=0.5 SENSITIVE Sensitive     ERYTHROMYCIN <=0.25 SENSITIVE Sensitive     GENTAMICIN <=0.5 SENSITIVE Sensitive     OXACILLIN 0.5 SENSITIVE Sensitive     TETRACYCLINE <=1 SENSITIVE Sensitive     VANCOMYCIN 1 SENSITIVE Sensitive     TRIMETH/SULFA <=10 SENSITIVE Sensitive     CLINDAMYCIN <=0.25 SENSITIVE Sensitive     RIFAMPIN <=0.5 SENSITIVE Sensitive     Inducible Clindamycin NEGATIVE Sensitive     * STAPHYLOCOCCUS AUREUS  Blood Culture ID Panel (Reflexed)     Status: Abnormal   Collection Time: 01/10/22  5:12 AM  Result Value Ref Range Status   Enterococcus faecalis NOT DETECTED NOT DETECTED Final   Enterococcus Faecium NOT DETECTED NOT DETECTED Final   Listeria monocytogenes NOT DETECTED NOT DETECTED Final   Staphylococcus species DETECTED (A) NOT DETECTED Final    Comment: CRITICAL RESULT CALLED TO, READ BACK BY AND VERIFIED WITH: PHARMD TONY  RUDISILL ON 01/10/22 @ 2309 BY DRT    Staphylococcus aureus (BCID) DETECTED (A) NOT DETECTED Final    Comment: CRITICAL RESULT CALLED TO, READ BACK BY AND VERIFIED WITH: PHARMD TONY  RUDISILL ON 01/10/22 @ 2309 BY DRT    Staphylococcus epidermidis NOT DETECTED NOT DETECTED Final   Staphylococcus lugdunensis NOT DETECTED NOT DETECTED Final   Streptococcus species NOT DETECTED NOT DETECTED Final   Streptococcus agalactiae NOT DETECTED NOT DETECTED Final   Streptococcus pneumoniae NOT DETECTED NOT DETECTED Final   Streptococcus pyogenes NOT DETECTED NOT DETECTED Final   A.calcoaceticus-baumannii NOT DETECTED NOT DETECTED Final   Bacteroides fragilis NOT DETECTED NOT DETECTED Final   Enterobacterales NOT DETECTED NOT DETECTED Final   Enterobacter cloacae  complex NOT DETECTED NOT DETECTED Final   Escherichia coli NOT DETECTED NOT DETECTED Final   Klebsiella aerogenes NOT DETECTED NOT DETECTED Final   Klebsiella oxytoca NOT DETECTED NOT DETECTED Final  Klebsiella pneumoniae NOT DETECTED NOT DETECTED Final   Proteus species NOT DETECTED NOT DETECTED Final   Salmonella species NOT DETECTED NOT DETECTED Final   Serratia marcescens NOT DETECTED NOT DETECTED Final   Haemophilus influenzae NOT DETECTED NOT DETECTED Final   Neisseria meningitidis NOT DETECTED NOT DETECTED Final   Pseudomonas aeruginosa NOT DETECTED NOT DETECTED Final   Stenotrophomonas maltophilia NOT DETECTED NOT DETECTED Final   Candida albicans NOT DETECTED NOT DETECTED Final   Candida auris NOT DETECTED NOT DETECTED Final   Candida glabrata NOT DETECTED NOT DETECTED Final   Candida krusei NOT DETECTED NOT DETECTED Final   Candida parapsilosis NOT DETECTED NOT DETECTED Final   Candida tropicalis NOT DETECTED NOT DETECTED Final   Cryptococcus neoformans/gattii NOT DETECTED NOT DETECTED Final   Meth resistant mecA/C and MREJ NOT DETECTED NOT DETECTED Final    Comment: Performed at [facility], [address] [place], [place] [address]  Blood culture (routine x 2)     Status: None (Preliminary result)   Collection Time: 01/10/22  5:24 AM   Specimen: BLOOD  Result Value Ref Range Status   Specimen Description   Final    BLOOD RIGHT ANTECUBITAL Performed at [facility], [address] [place], [place] [address]    Special Requests   Final    BOTTLES DRAWN AEROBIC AND ANAEROBIC Blood Culture adequate volume Performed at [facility], [address] [place], [place] [address]    Culture  Setup Time   Final    GRAM POSITIVE COCCI ANAEROBIC BOTTLE ONLY CRITICAL VALUE NOTED.  VALUE IS CONSISTENT WITH PREVIOUSLY REPORTED AND CALLED VALUE. Performed at [facility], [address], [place], [place] [address]    Culture GRAM  POSITIVE COCCI  Final   Report Status PENDING  Incomplete  Culture, blood (Routine X 2) w Reflex to ID Panel     Status: None (Preliminary result)   Collection Time: 01/12/22  5:55 AM   Specimen: BLOOD LEFT HAND  Result Value Ref Range Status   Specimen Description BLOOD LEFT HAND  Final   Special Requests   Final    BOTTLES DRAWN AEROBIC AND ANAEROBIC Blood Culture adequate volume   Culture   Final    NO GROWTH < 12 HOURS Performed at [facility], [address] [place], [place] [address]    Report Status PENDING  Incomplete  Culture, blood (Routine X 2) w Reflex to ID Panel     Status: None (Preliminary result)   Collection Time: 01/12/22  5:55 AM   Specimen: BLOOD RIGHT HAND  Result Value Ref Range Status   Specimen Description BLOOD RIGHT HAND  Final   Special Requests   Final    BOTTLES DRAWN AEROBIC AND ANAEROBIC Blood Culture adequate volume   Culture   Final    NO GROWTH < 12 HOURS Performed at [facility], [address], [place], [place] [address]    Report Status PENDING  Incomplete     Antimicrobials: Anti-infectives (From admission, onward)    Start     Dose/Rate Route Frequency Ordered Stop   01/11/22 0930  ceFAZolin (ANCEF) IVPB 2g/100 mL premix        2 g 200 mL/hr over 30 Minutes Intravenous Every 8 hours 01/11/22 0831     01/10/22 1345  ceFEPIme (MAXIPIME) 2 g in sodium chloride 0.9 % 100 mL IVPB        2 g  200 mL/hr over 30 Minutes Intravenous  Once 01/10/22 1330 01/10/22 1423   01/10/22 1345  vancomycin (VANCOCIN) IVPB 1000 mg/200 mL premix        1,000 mg 200 mL/hr over 60 Minutes Intravenous  Once 01/10/22 1343 01/10/22 1601   01/10/22 1145  ceFEPIme (MAXIPIME) 2 g in sodium chloride 0.9 % 100 mL IVPB  Status:  Discontinued        2 g 200 mL/hr over 30 Minutes Intravenous  Once 01/10/22 1143 01/10/22 1146      Culture/Microbiology    Component Value Date/Time   SDES BLOOD LEFT HAND 01/12/2022 0555   SDES BLOOD RIGHT HAND  01/12/2022 0555   SPECREQUEST  01/12/2022 0555    BOTTLES DRAWN AEROBIC AND ANAEROBIC Blood Culture adequate volume   SPECREQUEST  01/12/2022 0555    BOTTLES DRAWN AEROBIC AND ANAEROBIC Blood Culture adequate volume   CULT  01/12/2022 0555    NO GROWTH < 12 HOURS Performed at [facility], [address], [place], [place] [address]    CULT  01/12/2022 0555    NO GROWTH < 12 HOURS Performed at [facility], [address], [place], [place] [address]    REPTSTATUS PENDING 01/12/2022 0555   REPTSTATUS PENDING 01/12/2022 0555    Other culture-see note  Radiology Studies: ECHOCARDIOGRAM COMPLETE  Result Date: 01/11/2022    ECHOCARDIOGRAM REPORT   Patient Name:   Krystal [place] Date of Exam: 01/11/2022 Medical Rec #:  [id number]       Height:       67.0 in Accession #:    [id number]      Weight:       145.0 lb Date of Birth:  April 14, 1996      BSA:          1.764 m Patient Age:    25 years        BP:           125/71 mmHg Patient Gender: F               HR:           91 bpm. Exam Location:  Inpatient Procedure: 2D Echo, Cardiac Doppler and Color Doppler Indications:    Bacteremia  History:        Patient has no prior history of Echocardiogram examinations.  Sonographer:    Cleatis Polka Referring Phys: [id number] DANIEL V THOMPSON IMPRESSIONS  1. Left ventricular ejection fraction, by estimation, is 60 to 65%. The left ventricle has normal function. The left ventricle has no regional wall motion abnormalities. Left ventricular diastolic parameters were normal.  2. Right ventricular systolic function is normal. The right ventricular size is normal.  3. The mitral valve is normal in structure. Mild mitral valve regurgitation.  4. The aortic valve is normal in structure. Aortic valve regurgitation is not visualized.  5. The inferior vena cava is normal in size with greater than 50% respiratory variability, suggesting right atrial pressure of 3 mmHg. FINDINGS  Left Ventricle: Left ventricular ejection  fraction, by estimation, is 60 to 65%. The left ventricle has normal function. The left ventricle has no regional wall motion abnormalities. The left ventricular internal cavity size was normal in size. There is  no left ventricular hypertrophy. Left ventricular diastolic parameters were normal. Right Ventricle: The right ventricular size is normal. Right vetricular wall thickness was not assessed. Right ventricular systolic function is normal. Left Atrium: Left atrial size was normal in  size. Right Atrium: Right atrial size was normal in size. Pericardium: There is no evidence of pericardial effusion. Mitral Valve: The mitral valve is normal in structure. Mild mitral valve regurgitation. Tricuspid Valve: The tricuspid valve is normal in structure. Tricuspid valve regurgitation is trivial. Aortic Valve: The aortic valve is normal in structure. Aortic valve regurgitation is not visualized. Aortic valve peak gradient measures 7.7 mmHg. Pulmonic Valve: The pulmonic valve was not well visualized. Pulmonic valve regurgitation is mild. No evidence of pulmonic stenosis. Aorta: The aortic root and ascending aorta are structurally normal, with no evidence of dilitation. Venous: The inferior vena cava is normal in size with greater than 50% respiratory variability, suggesting right atrial pressure of 3 mmHg. IAS/Shunts: No atrial level shunt detected by color flow Doppler.  LEFT VENTRICLE PLAX 2D LVIDd:         4.50 cm      Diastology LVIDs:         3.30 cm      LV e' medial:    9.68 cm/s LV PW:         1.00 cm      LV E/e' medial:  10.7 LV IVS:        0.80 cm      LV e' lateral:   11.60 cm/s LVOT diam:     2.00 cm      LV E/e' lateral: 9.0 LV SV:         72 LV SV Index:   41 LVOT Area:     3.14 cm  LV Volumes (MOD) LV vol d, MOD A2C: 109.0 ml LV vol d, MOD A4C: 102.0 ml LV vol s, MOD A2C: 37.3 ml LV vol s, MOD A4C: 36.8 ml LV SV MOD A2C:     71.7 ml LV SV MOD A4C:     102.0 ml LV SV MOD BP:      69.5 ml RIGHT VENTRICLE              IVC RV Basal diam:  2.80 cm     IVC diam: 1.90 cm RV Mid diam:    2.80 cm RV S prime:     15.40 cm/s TAPSE (M-mode): 2.4 cm LEFT ATRIUM             Index        RIGHT ATRIUM          Index LA diam:        3.00 cm 1.70 cm/m   RA Area:     9.61 cm LA Vol (A2C):   30.4 ml 17.23 ml/m  RA Volume:   19.00 ml 10.77 ml/m LA Vol (A4C):   44.0 ml 24.94 ml/m LA Biplane Vol: 38.0 ml 21.54 ml/m  AORTIC VALVE AV Area (Vmax): 2.85 cm AV Vmax:        139.00 cm/s AV Peak Grad:   7.7 mmHg LVOT Vmax:      126.00 cm/s LVOT Vmean:     83.500 cm/s LVOT VTI:       0.229 m  AORTA Ao Root diam: 2.90 cm Ao Asc diam:  2.40 cm MITRAL VALVE MV Area (PHT): 5.75 cm     SHUNTS MV Decel Time: 132 msec     Systemic VTI:  0.23 m MV E velocity: 104.00 cm/s  Systemic Diam: 2.00 cm MV A velocity: 104.00 cm/s MV E/A ratio:  1.00 Dietrich Pates MD Electronically signed by Dietrich Pates MD Signature Date/Time: 01/11/2022/12:07:39 PM    Final  LOS: 3 days   Lanae Boastamesh Quinlan Mcfall, MD [facility]  01/13/2022, 10:14 AM

## 2022-01-13 NOTE — H&P (View-Only) (Signed)
    [facility] has been requested to perform a transesophageal echocardiogram on Krystal [place] for bacteremia in the setting of IVDU. Per chart review, patient has a past medical history of IVDU, mood disorder. Patient presented to the ED complaining of 3 days of lower back pain. Work-up showed leukocytosis. MRI of thoracic and lumbar spine was concerning for septic arthritis at L2-L3. Blood cultures from 5/29 positive for MSSA. Echocardiogram on 5/30 showed EF 60-65%, normal RV systolic function, no significant valvular abnormalities. ID requested TEE to rule out endocarditis.   After careful review of history and examination, the risks and benefits of transesophageal echocardiogram have been explained including risks of esophageal damage, perforation (1:10,000 risk), bleeding, pharyngeal hematoma as well as other potential complications associated with conscious sedation including aspiration, arrhythmia, respiratory failure and death. Alternatives to treatment were discussed, questions were answered. Patient is willing to proceed.   Ariea Rochin R. Lauralie Blacksher, PA-C 01/13/2022 4:55 PM   

## 2022-01-13 NOTE — Progress Notes (Signed)
    [facility] has been requested to perform a transesophageal echocardiogram on JONNELLE LAWNICZAK for bacteremia in the setting of IVDU. Per chart review, patient has a past medical history of IVDU, mood disorder. Patient presented to the ED complaining of 3 days of lower back pain. Work-up showed leukocytosis. MRI of thoracic and lumbar spine was concerning for septic arthritis at L2-L3. Blood cultures from 5/29 positive for MSSA. Echocardiogram on 5/30 showed EF 60-65%, normal RV systolic function, no significant valvular abnormalities. ID requested TEE to rule out endocarditis.   After careful review of history and examination, the risks and benefits of transesophageal echocardiogram have been explained including risks of esophageal damage, perforation (1:10,000 risk), bleeding, pharyngeal hematoma as well as other potential complications associated with conscious sedation including aspiration, arrhythmia, respiratory failure and death. Alternatives to treatment were discussed, questions were answered. Patient is willing to proceed.   Jonita Albee, PA-C 01/13/2022 4:55 PM

## 2022-01-14 ENCOUNTER — Encounter (HOSPITAL_COMMUNITY)
Admission: EM | Disposition: A | Discharge status: Home / Self Care | Payer: Self-pay | Source: Home / Self Care | Attending: Internal Medicine

## 2022-01-14 ENCOUNTER — Inpatient Hospital Stay (HOSPITAL_COMMUNITY): Payer: Medicaid Other

## 2022-01-14 ENCOUNTER — Inpatient Hospital Stay (HOSPITAL_COMMUNITY): Payer: Medicaid Other | Admitting: Anesthesiology

## 2022-01-14 ENCOUNTER — Encounter (HOSPITAL_COMMUNITY): Payer: Self-pay | Admitting: Internal Medicine

## 2022-01-14 DIAGNOSIS — D649 Anemia, unspecified: Secondary | ICD-10-CM

## 2022-01-14 DIAGNOSIS — I081 Rheumatic disorders of both mitral and tricuspid valves: Secondary | ICD-10-CM | POA: Diagnosis not present

## 2022-01-14 DIAGNOSIS — R7881 Bacteremia: Secondary | ICD-10-CM | POA: Diagnosis not present

## 2022-01-14 DIAGNOSIS — B1920 Unspecified viral hepatitis C without hepatic coma: Secondary | ICD-10-CM

## 2022-01-14 DIAGNOSIS — B9561 Methicillin susceptible Staphylococcus aureus infection as the cause of diseases classified elsewhere: Secondary | ICD-10-CM | POA: Diagnosis not present

## 2022-01-14 DIAGNOSIS — M1990 Unspecified osteoarthritis, unspecified site: Secondary | ICD-10-CM | POA: Diagnosis not present

## 2022-01-14 DIAGNOSIS — M4656 Other infective spondylopathies, lumbar region: Secondary | ICD-10-CM | POA: Diagnosis not present

## 2022-01-14 HISTORY — PX: TEE WITHOUT CARDIOVERSION: SHX5443

## 2022-01-14 LAB — CBC
HCT: 30.9 % — ABNORMAL LOW (ref 36.0–46.0)
Hemoglobin: 10.2 g/dL — ABNORMAL LOW (ref 12.0–15.0)
MCH: 25.8 pg — ABNORMAL LOW (ref 26.0–34.0)
MCHC: 33 g/dL (ref 30.0–36.0)
MCV: 78.2 fL — ABNORMAL LOW (ref 80.0–100.0)
Platelets: 388 10*3/uL (ref 150–400)
RBC: 3.95 MIL/uL (ref 3.87–5.11)
RDW: 13.6 % (ref 11.5–15.5)
WBC: 6.2 10*3/uL (ref 4.0–10.5)
nRBC: 0 % (ref 0.0–0.2)

## 2022-01-14 LAB — BASIC METABOLIC PANEL
Anion gap: 12 (ref 5–15)
BUN: 9 mg/dL (ref 6–20)
CO2: 23 mmol/L (ref 22–32)
Calcium: 8.8 mg/dL — ABNORMAL LOW (ref 8.9–10.3)
Chloride: 103 mmol/L (ref 98–111)
Creatinine, Ser: 0.56 mg/dL (ref 0.44–1.00)
GFR, Estimated: >60 mL/min (ref >60)
Glucose, Bld: 104 mg/dL — ABNORMAL HIGH (ref 70–99)
Potassium: 3.8 mmol/L (ref 3.5–5.1)
Sodium: 138 mmol/L (ref 135–145)

## 2022-01-14 SURGERY — ECHOCARDIOGRAM, TRANSESOPHAGEAL
Anesthesia: Monitor Anesthesia Care

## 2022-01-14 MED ORDER — PROPOFOL 10 MG/ML IV BOLUS
INTRAVENOUS | Status: DC | PRN
  Administered 2022-01-14: 30 mg via INTRAVENOUS
  Administered 2022-01-14: 20 mg via INTRAVENOUS
  Administered 2022-01-14: 70 mg via INTRAVENOUS

## 2022-01-14 MED ORDER — SODIUM CHLORIDE 0.9 % IV SOLN: INTRAVENOUS | Status: DC

## 2022-01-14 MED ORDER — PROPOFOL 500 MG/50ML IV EMUL
INTRAVENOUS | Status: DC | PRN
  Administered 2022-01-14: 100 ug/kg/min via INTRAVENOUS

## 2022-01-14 NOTE — CV Procedure (Signed)
Brief TEE Note  LVEF 60-65% No LA/LAA thrombus or masses No endocarditis Trivial MR, trivial TR  For additional details see full report.  Piper Albro C. Oval Linsey, MD, [facility] 01/14/2022 11:11 AM

## 2022-01-14 NOTE — Anesthesia Postprocedure Evaluation (Signed)
Anesthesia Post Note  Patient: Krystal [place]  Procedure(s) Performed: TRANSESOPHAGEAL ECHOCARDIOGRAM (TEE)     Patient location during evaluation: PACU Anesthesia Type: MAC Level of consciousness: awake and alert Pain management: pain level controlled Vital Signs Assessment: post-procedure vital signs reviewed and stable Respiratory status: spontaneous breathing and respiratory function stable Cardiovascular status: stable Postop Assessment: no apparent nausea or vomiting Anesthetic complications: no   No notable events documented.  Last Vitals:  Vitals:   01/14/22 1125 01/14/22 1135  BP: (!) 156/70 (!) 126/94  Pulse: 72 62  Resp: (!) 21 15  Temp:    SpO2: 99% 100%    Last Pain:  Vitals:   01/14/22 1135  TempSrc:   PainSc: 0-No pain                 Merlinda Frederick

## 2022-01-14 NOTE — Anesthesia Preprocedure Evaluation (Addendum)
Anesthesia Evaluation  Patient identified by MRN, date of birth, ID band Patient awake    Reviewed: Allergy & Precautions, NPO status , Patient's Chart, lab work & pertinent test results  Airway Mallampati: II  TM Distance: >3 FB Neck ROM: Full    Dental  (+) Edentulous Upper, Poor Dentition Bottom teeth broken and decayed:   Pulmonary asthma , Current Smoker and Patient abstained from smoking.,    Pulmonary exam normal breath sounds clear to auscultation       Cardiovascular Normal cardiovascular exam Rhythm:Regular Rate:Normal  01/11/22 ECHO"  1. Left ventricular ejection fraction, by estimation, is 60 to 65%. The  left ventricle has normal function. The left ventricle has no regional  wall motion abnormalities. Left ventricular diastolic parameters were  normal.  2. Right ventricular systolic function is normal. The right ventricular  size is normal.  3. The mitral valve is normal in structure. Mild mitral valve  regurgitation.  4. The aortic valve is normal in structure. Aortic valve regurgitation is  not visualized.  5. The inferior vena cava is normal in size with greater than 50%  respiratory variability, suggesting right atrial pressure of 3 mmHg.    Neuro/Psych negative neurological ROS  negative psych ROS   GI/Hepatic negative GI ROS, (+)     substance abuse  , Hepatitis -, C  Endo/Other  negative endocrine ROS  Renal/GU negative Renal ROS  negative genitourinary   Musculoskeletal  (+) Arthritis ,   Abdominal   Peds negative pediatric ROS (+)  Hematology  (+) Blood dyscrasia, anemia ,   Anesthesia Other Findings   Reproductive/Obstetrics negative OB ROS                            Anesthesia Physical Anesthesia Plan  ASA: 3  Anesthesia Plan: MAC   Post-op Pain Management: Minimal or no pain anticipated   Induction: Intravenous  PONV Risk Score and Plan: 1 and  Propofol infusion, TIVA and Treatment may vary due to age or medical condition  Airway Management Planned: Natural Airway and Nasal Cannula  Additional Equipment: None  Intra-op Plan:   Post-operative Plan:   Informed Consent:   Plan Discussed with: Anesthesiologist and CRNA  Anesthesia Plan Comments:         Anesthesia Quick Evaluation

## 2022-01-14 NOTE — Progress Notes (Addendum)
[facility]  Date of Admission:  01/10/2022           Reason for visit: Follow up on MSSA bacteremia   Current antibiotics: Cefazolin    ASSESSMENT:    26 y.o. female admitted with:  MSSA bacteremia: Complicated by L2-3 septic arthritis and myositis and probable 10x21mm abscess at L3 (too small for IR aspiration).   Repeat blood cultures drawn 5/31 NGTD.  TTE was negative for vegetation and TEE is planned for today. 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  Hepatitis C: HCV RNA detectable at 30 copies.  RECOMMENDATIONS:    Continue cefazolin Follow up TEE Follow repeat blood cultures She may be clearing HCV infection on her own.  Likely repeat HCV RNA in 3 months to determine need for treatment Check further hepatitis A and B serology Will follow.  Dr Thedore Mins here as needed over the weekend.  New ID team to assume care on Monday (Dr Thedore Mins or Dr Drue Second).   Principal Problem:   Septic arthritis of lumbar spine (HCC) Active Problems:   Drug abuse and dependence (HCC)   Hematuria   Staphylococcus aureus bacteremia   UTI (urinary tract infection)   Septic arthritis of vertebra (HCC)   Hypokalemia   Microcytic anemia   Hepatitis C virus infection    MEDICATIONS:    Scheduled Meds:  enoxaparin (LOVENOX) injection  40 mg Subcutaneous Q24H   nicotine  21 mg Transdermal Daily   senna-docusate  1 tablet Oral BID   sodium chloride flush  3 mL Intravenous Q12H   Continuous Infusions:  sodium chloride     sodium chloride      ceFAZolin (ANCEF) IV 2 g (01/14/22 0059)   methocarbamol (ROBAXIN) IV 500 mg (01/14/22 0301)   PRN Meds:.sodium chloride, acetaminophen **OR** acetaminophen, ALPRAZolam, dicyclomine, hydrOXYzine, ketorolac, loperamide, methocarbamol (ROBAXIN) IV, [START ON 01/16/2022] naproxen, ondansetron **OR** ondansetron (ZOFRAN) IV, ondansetron, oxyCODONE, polyethylene glycol, sodium chloride flush  SUBJECTIVE:   24 hour events:  No  major events TEE planned for today 5/29 blooc cx positive MSSA 5/31 blood cx NGTD HCV RNA 30 copies   She is waiting on her TEE.  Asking when she will be able to leave.  She is withdrawing.   Review of Systems  All other systems reviewed and are negative.    OBJECTIVE:   Blood pressure 121/88, pulse (!) 58, temperature 98.4 F (36.9 C), temperature source Oral, resp. rate 18, height 5\' 7"  (1.702 m), SpO2 100 %. Body mass index is 22.71 kg/m.  Physical Exam Constitutional:      General: She is not in acute distress.    Appearance: Normal appearance.  HENT:     Head: Normocephalic and atraumatic.  Eyes:     Extraocular Movements: Extraocular movements intact.     Conjunctiva/sclera: Conjunctivae normal.  Pulmonary:     Effort: Pulmonary effort is normal. No respiratory distress.  Abdominal:     General: There is no distension.     Palpations: Abdomen is soft.  Musculoskeletal:        General: Normal range of motion.     Cervical back: Normal range of motion and neck supple.  Skin:    General: Skin is warm and dry.     Findings: No rash.  Neurological:     General: No focal deficit present.     Mental Status: She is alert and oriented to person, place, and time.  Psychiatric:  Comments: She is anxious.      Lab Results: Lab Results  Component Value Date   WBC 4.4 01/12/2022   HGB 10.0 (L) 01/12/2022   HCT 30.1 (L) 01/12/2022   MCV 78.0 (L) 01/12/2022   PLT 285 01/12/2022    Lab Results  Component Value Date   NA 138 01/14/2022   K 3.8 01/14/2022   CO2 23 01/14/2022   GLUCOSE 104 (H) 01/14/2022   BUN 9 01/14/2022   CREATININE 0.56 01/14/2022   CALCIUM 8.8 (L) 01/14/2022   GFRNONAA >60 01/14/2022   GFRAA >90 04/04/2013    Lab Results  Component Value Date   ALT 13 01/10/2022   AST 14 (L) 01/10/2022   ALKPHOS 71 01/10/2022   BILITOT 0.5 01/10/2022       Component Value Date/Time   CRP 13.2 (H) 01/10/2022 1350       Component Value  Date/Time   ESRSEDRATE 75 (H) 01/10/2022 1350     I have reviewed the micro and lab results in Epic.  Imaging: No results found.   Imaging independently reviewed in Epic.    Vedia Coffer for Infectious Disease [facility] [phone] pager 01/14/2022, 8:16 AM

## 2022-01-14 NOTE — Plan of Care (Signed)

## 2022-01-14 NOTE — Transfer of Care (Signed)
Immediate Anesthesia Transfer of Care Note  Patient: Krystal Valdez  Procedure(s) Performed: TRANSESOPHAGEAL ECHOCARDIOGRAM (TEE)  Patient Location: PACU and Endoscopy Unit  Anesthesia Type:MAC  Level of Consciousness: drowsy  Airway & Oxygen Therapy: Patient Spontanous Breathing  Post-op Assessment: Report given to RN and Post -op Vital signs reviewed and stable  Post vital signs: Reviewed and stable  Last Vitals:  Vitals Value Taken Time  BP 144/79 01/14/22 1114  Temp    Pulse 52 01/14/22 1115  Resp 25 01/14/22 1115  SpO2 100 % 01/14/22 1115  Vitals shown include unvalidated device data.  Last Pain:  Vitals:   01/14/22 1114  TempSrc:   PainSc: 0-No pain      Patients Stated Pain Goal: 4 (28/00/34 9179)  Complications: No notable events documented.

## 2022-01-14 NOTE — Progress Notes (Signed)
  Echocardiogram Echocardiogram Transesophageal has been performed.  Krystal Valdez 01/14/2022, 12:10 PM

## 2022-01-14 NOTE — Progress Notes (Signed)
PROGRESS NOTE Krystal Valdez  [id number] DOB: [birth year] DOA: 01/10/2022 PCP: Pcp, No   Brief Narrative/Hospital Course: 26 year old female history of IV drug use, mood disorder presented with 3-day history of lower back pain, work-up showed leukocytosis and elevated sed rate MRI T and L-spine concerning for septic arthritis at L2 -L3 and  10x15 mm fluid collection. Neurosurg rec IR and ID consult. IR consulted for aspiration and too small for aspiration.  ID consulted and helping with antibiotic management.  2D echo ordered no acute finding,    Subjective: Seen and examined this morning.  Waiting for TEE, lost IV access Waiting for medication.  No new complaints. Overnight afebrile,repeat blood cultures 5/31 have been negative   Assessment and Plan: Principal Problem:   Septic arthritis of lumbar spine ([place]) Active Problems:   Staphylococcus aureus bacteremia   Drug abuse and dependence (HCC)   Hematuria   UTI (urinary tract infection)   Septic arthritis of vertebra (HCC)   Hypokalemia   Microcytic anemia   Hepatitis C virus infection   Septic arthritis of lumbar spine L2 -L3 and probably 10x15 mm fluid collection and myositis: MSSA bacteremia: Repeat blood culture 5/31 no growth so far so far, TEE TEE negativ. TEE pending for 6/2.  Continue IV Ancef.  IVDA: Patient admits to taking heroin and methamphetamine.  Cessation has been discussed.  TOC consulted.  HIV negative RPR negative HCV antibody positive.Minimize opiates, continue increased dose Atarax  iv Toradol , IV Robaxin for further anxiety and pain management.  Continue low-dose Xanax.  Hepatitis C HCVRNA detectable at 30 copies.  CBD clearing on her own will need HCVRNA in 3 months   UTI continue antibiotics as above. He is hypokalemia: Resolved Microcytic anemia hemoglobin stable in 10 g.  Monitor  I requested patient to stay in the hospital to complete IV antibiotics until she is cleared for discharge, advised not  to leave [place] as it may lead to further worsening of infection that could be disabling,  leading to paraplegia or even death.  Patient verbalized understanding  DVT prophylaxis: enoxaparin (LOVENOX) injection 40 mg Start: 01/10/22 2030 Code Status:   Code Status: Full Code Family Communication: plan of care discussed with patient at bedside. Patient status is: Inpatient because of further work-up and to rule out endocarditis Level of care: Med-Surg   Dispo: The patient is from: Home            Anticipated disposition: Home after cleared by ID  Mobility Assessment (last 72 hours)     Mobility Assessment     Row Name 01/13/22 2250 01/13/22 0835 01/12/22 1950 01/12/22 0750 01/11/22 1950   Does patient have an order for bedrest or is patient medically unstable No - Continue assessment No - Continue assessment No - Continue assessment No - Continue assessment No - Continue assessment   What is the highest level of mobility based on the progressive mobility assessment? Level 5 (Walks with assist in room/hall) - Balance while stepping forward/back and can walk in room with assist - Complete Level 6 (Walks independently in room and hall) - Balance while walking in room without assist - Complete Level 5 (Walks with assist in room/hall) - Balance while stepping forward/back and can walk in room with assist - Complete Level 4 (Walks with assist in room) - Balance while marching in place and cannot step forward and back - Complete Level 4 (Walks with assist in room) - Balance while marching in place and  cannot step forward and back - Complete   Is the above level different from baseline mobility prior to current illness? No - Consider discontinuing PT/OT No - Consider discontinuing PT/OT No - Consider discontinuing PT/OT No - Consider discontinuing PT/OT No - Consider discontinuing PT/OT             Objective: Vitals last 24 hrs: Vitals:   01/13/22 0838 01/13/22 2010 01/14/22 0339  01/14/22 0730  BP: 122/66 125/76 121/88   Pulse: 84 63 (!) 58   Resp: 16 16 19 18   Temp: 98 F (36.7 C) 98.2 F (36.8 C) 98.4 F (36.9 C)   TempSrc: Oral Oral Oral   SpO2: 100% 99% 100%   Height:       Weight change:   Physical Examination:  General exam: AA0X3, older than stated age, weak appearing. HEENT:Oral mucosa moist, Ear/Nose WNL grossly, dentition normal. Respiratory system: bilaterally diminished,no use of accessory muscle Cardiovascular system: S1 & S2 +, No JVD,. Gastrointestinal system: Abdomen soft,NT,ND, BS+ Nervous System:Alert, awake, moving extremities and grossly nonfocal Extremities: edema neg,distal peripheral pulses palpable.  Skin: No rashes,no icterus. MSK: Normal muscle bulk,tone, power  Medications reviewed:  Scheduled Meds:  enoxaparin (LOVENOX) injection  40 mg Subcutaneous Q24H   nicotine  21 mg Transdermal Daily   senna-docusate  1 tablet Oral BID   sodium chloride flush  3 mL Intravenous Q12H   Continuous Infusions:  sodium chloride     sodium chloride      ceFAZolin (ANCEF) IV 2 g (01/14/22 0059)   methocarbamol (ROBAXIN) IV 500 mg (01/14/22 0301)    Diet Order             Diet NPO time specified  Diet effective midnight                   Intake/Output Summary (Last 24 hours) at 01/14/2022 0812 Last data filed at 01/14/2022 0700 Gross per 24 hour  Intake 240 ml  Output --  Net 240 ml   Net IO Since Admission: 2,378.98 mL [01/14/22 0812]  Wt Readings from Last 3 Encounters:  08/04/16 65.8 kg  06/29/16 68 kg  05/12/16 68.5 kg     Unresulted Labs (From admission, onward)     Start     Ordered   01/14/22 0330  CBC  Once,   R        01/14/22 0330          Data Reviewed: I have personally reviewed following labs and imaging studies CBC: Recent Labs  Lab 01/10/22 0512 01/11/22 0412 01/12/22 0555  WBC 10.7* 5.7 4.4  NEUTROABS 8.1*  --  3.0  HGB 11.1* 10.4* 10.0*  HCT 34.2* 30.2* 30.1*  MCV 79.9* 76.8* 78.0*  PLT  330 292 [id number]   Basic Metabolic Panel: Recent Labs  Lab 01/10/22 0512 01/11/22 0412 01/12/22 0555 01/14/22 0242  NA 136 131* 134* 138  K 3.8 3.3* 3.1* 3.8  CL 100 98 100 103  CO2 27 23 23 23   GLUCOSE 94 114* 101* 104*  BUN 12 7 6 9   CREATININE 0.49 0.50 0.58 0.56  CALCIUM 9.2 8.9 8.8* 8.8*  MG  --  2.1  --   --    GFR: CrCl cannot be calculated (Unknown ideal weight.). Liver Function Tests: Recent Labs  Lab 01/10/22 0512  AST 14*  ALT 13  ALKPHOS 71  BILITOT 0.5  PROT 8.2*  ALBUMIN 3.8   No results for input(s): LIPASE, AMYLASE in  the last 168 hours. No results for input(s): AMMONIA in the last 168 hours. Coagulation Profile: No results for input(s): INR, PROTIME in the last 168 hours. BNP (last 3 results) No results for input(s): PROBNP in the last 8760 hours. HbA1C: No results for input(s): HGBA1C in the last 72 hours. CBG: No results for input(s): GLUCAP in the last 168 hours. Lipid Profile: No results for input(s): CHOL, HDL, LDLCALC, TRIG, CHOLHDL, LDLDIRECT in the last 72 hours. Thyroid Function Tests: No results for input(s): TSH, T4TOTAL, FREET4, T3FREE, THYROIDAB in the last 72 hours. Sepsis Labs: Recent Labs  Lab 01/10/22 [id number]  LATICACIDVEN 1.0    Recent Results (from the past 240 hour(s))  Blood culture (routine x 2)     Status: Abnormal   Collection Time: 01/10/22  5:12 AM   Specimen: BLOOD  Result Value Ref Range Status   Specimen Description   Final    BLOOD LEFT ANTECUBITAL Performed at [facility] [address] [place], [place] [address]    Special Requests   Final    BOTTLES DRAWN AEROBIC AND ANAEROBIC Blood Culture results may not be optimal due to an inadequate volume of blood received in culture bottles Performed at [facility] [address] [place], [place] [address]    Culture  Setup Time   Final    GRAM POSITIVE COCCI AEROBIC BOTTLE ONLY CRITICAL RESULT CALLED TO, READ BACK BY AND  VERIFIED WITH: PHARMD TONY  RUDISILL ON 01/10/22 @ [id number] BY DRT Performed at [facility], [place] [address], [place], [place] [address]    Culture STAPHYLOCOCCUS AUREUS (A)  Final   Report Status 01/12/2022 FINAL  Final   Organism ID, Bacteria STAPHYLOCOCCUS AUREUS  Final      Susceptibility   Staphylococcus aureus - MIC*    CIPROFLOXACIN <=0.5 SENSITIVE Sensitive     ERYTHROMYCIN <=0.25 SENSITIVE Sensitive     GENTAMICIN <=0.5 SENSITIVE Sensitive     OXACILLIN 0.5 SENSITIVE Sensitive     TETRACYCLINE <=1 SENSITIVE Sensitive     VANCOMYCIN 1 SENSITIVE Sensitive     TRIMETH/SULFA <=10 SENSITIVE Sensitive     CLINDAMYCIN <=0.25 SENSITIVE Sensitive     RIFAMPIN <=0.5 SENSITIVE Sensitive     Inducible Clindamycin NEGATIVE Sensitive     * STAPHYLOCOCCUS AUREUS  Blood Culture ID Panel (Reflexed)     Status: Abnormal   Collection Time: 01/10/22  5:12 AM  Result Value Ref Range Status   Enterococcus faecalis NOT DETECTED NOT DETECTED Final   Enterococcus Faecium NOT DETECTED NOT DETECTED Final   Listeria monocytogenes NOT DETECTED NOT DETECTED Final   Staphylococcus species DETECTED (A) NOT DETECTED Final    Comment: CRITICAL RESULT CALLED TO, READ BACK BY AND VERIFIED WITH: PHARMD TONY  RUDISILL ON 01/10/22 @ 2309 BY DRT    Staphylococcus aureus (BCID) DETECTED (A) NOT DETECTED Final    Comment: CRITICAL RESULT CALLED TO, READ BACK BY AND VERIFIED WITH: PHARMD TONY  RUDISILL ON 01/10/22 @ 2309 BY DRT    Staphylococcus epidermidis NOT DETECTED NOT DETECTED Final   Staphylococcus lugdunensis NOT DETECTED NOT DETECTED Final   Streptococcus species NOT DETECTED NOT DETECTED Final   Streptococcus agalactiae NOT DETECTED NOT DETECTED Final   Streptococcus pneumoniae NOT DETECTED NOT DETECTED Final   Streptococcus pyogenes NOT DETECTED NOT DETECTED Final   A.calcoaceticus-baumannii NOT DETECTED NOT DETECTED Final   Bacteroides fragilis NOT DETECTED NOT DETECTED Final   Enterobacterales NOT  DETECTED NOT DETECTED Final  Enterobacter cloacae complex NOT DETECTED NOT DETECTED Final   Escherichia coli NOT DETECTED NOT DETECTED Final   Klebsiella aerogenes NOT DETECTED NOT DETECTED Final   Klebsiella oxytoca NOT DETECTED NOT DETECTED Final   Klebsiella pneumoniae NOT DETECTED NOT DETECTED Final   Proteus species NOT DETECTED NOT DETECTED Final   Salmonella species NOT DETECTED NOT DETECTED Final   Serratia marcescens NOT DETECTED NOT DETECTED Final   Haemophilus influenzae NOT DETECTED NOT DETECTED Final   Neisseria meningitidis NOT DETECTED NOT DETECTED Final   Pseudomonas aeruginosa NOT DETECTED NOT DETECTED Final   Stenotrophomonas maltophilia NOT DETECTED NOT DETECTED Final   Candida albicans NOT DETECTED NOT DETECTED Final   Candida auris NOT DETECTED NOT DETECTED Final   Candida glabrata NOT DETECTED NOT DETECTED Final   Candida krusei NOT DETECTED NOT DETECTED Final   Candida parapsilosis NOT DETECTED NOT DETECTED Final   Candida tropicalis NOT DETECTED NOT DETECTED Final   Cryptococcus neoformans/gattii NOT DETECTED NOT DETECTED Final   Meth resistant mecA/C and MREJ NOT DETECTED NOT DETECTED Final    Comment: Performed at [facility], [place] [address], [place], [place] [address]  Blood culture (routine x 2)     Status: None (Preliminary result)   Collection Time: 01/10/22  5:24 AM   Specimen: BLOOD  Result Value Ref Range Status   Specimen Description   Final    BLOOD RIGHT ANTECUBITAL Performed at [facility] [address] [place], [place] [address]    Special Requests   Final    BOTTLES DRAWN AEROBIC AND ANAEROBIC Blood Culture adequate volume Performed at [facility] [address] [place], [place] [address]    Culture  Setup Time   Final    GRAM POSITIVE COCCI ANAEROBIC BOTTLE ONLY CRITICAL VALUE NOTED.  VALUE IS CONSISTENT WITH PREVIOUSLY REPORTED AND CALLED VALUE. Performed at [facility],  [place] [address] [place], [place] [address]    Culture GRAM POSITIVE COCCI  Final   Report Status PENDING  Incomplete  Culture, blood (Routine X 2) w Reflex to ID Panel     Status: None (Preliminary result)   Collection Time: 01/12/22  5:55 AM   Specimen: BLOOD LEFT HAND  Result Value Ref Range Status   Specimen Description BLOOD LEFT HAND  Final   Special Requests   Final    BOTTLES DRAWN AEROBIC AND ANAEROBIC Blood Culture adequate volume   Culture   Final    NO GROWTH 1 DAY Performed at [facility], [place] [address] [place],  [address]    Report Status PENDING  Incomplete  Culture, blood (Routine X 2) w Reflex to ID Panel     Status: None (Preliminary result)   Collection Time: 01/12/22  5:55 AM   Specimen: BLOOD RIGHT HAND  Result Value Ref Range Status   Specimen Description BLOOD RIGHT HAND  Final   Special Requests   Final    BOTTLES DRAWN AEROBIC AND ANAEROBIC Blood Culture adequate volume   Culture   Final    NO GROWTH 1 DAY Performed at [facility], [place] [address] [place],  [address]    Report Status PENDING  Incomplete     Antimicrobials: Anti-infectives (From admission, onward)    Start     Dose/Rate Route Frequency Ordered Stop   01/11/22 0930  ceFAZolin (ANCEF) IVPB 2g/100 mL premix        2 g 200 mL/hr over 30  Minutes Intravenous Every 8 hours 01/11/22 0831     01/10/22 1345  ceFEPIme (MAXIPIME) 2 g in sodium chloride 0.9 % 100 mL IVPB        2 g 200 mL/hr over 30 Minutes Intravenous  Once 01/10/22 1330 01/10/22 1423   01/10/22 1345  vancomycin (VANCOCIN) IVPB 1000 mg/200 mL premix        1,000 mg 200 mL/hr over 60 Minutes Intravenous  Once 01/10/22 1343 01/10/22 1601   01/10/22 1145  ceFEPIme (MAXIPIME) 2 g in sodium chloride 0.9 % 100 mL IVPB  Status:  Discontinued        2 g 200 mL/hr over 30 Minutes Intravenous  Once 01/10/22 1143 01/10/22 1146      Culture/Microbiology    Component Value Date/Time   SDES BLOOD LEFT HAND  01/12/2022 0555   SDES BLOOD RIGHT HAND 01/12/2022 0555   SPECREQUEST  01/12/2022 0555    BOTTLES DRAWN AEROBIC AND ANAEROBIC Blood Culture adequate volume   SPECREQUEST  01/12/2022 0555    BOTTLES DRAWN AEROBIC AND ANAEROBIC Blood Culture adequate volume   CULT  01/12/2022 0555    NO GROWTH 1 DAY Performed at [facility], [place] [address], [place], [place] [address]    CULT  01/12/2022 0555    NO GROWTH 1 DAY Performed at [facility] [address], [place], [place] [address]    REPTSTATUS PENDING 01/12/2022 0555   REPTSTATUS PENDING 01/12/2022 0555    Other culture-see note  Radiology Studies: No results found.   LOS: 4 days   Antonieta Pert, MD [facility]  01/14/2022, 8:12 AM

## 2022-01-14 NOTE — Interval H&P Note (Signed)
History and Physical Interval Note:  01/14/2022 9:50 AM  Krystal Valdez  has presented today for surgery, with the diagnosis of BACTERIMIA; IV DRUG USE.  The various methods of treatment have been discussed with the patient and family. After consideration of risks, benefits and other options for treatment, the patient has consented to  Procedure(s): TRANSESOPHAGEAL ECHOCARDIOGRAM (TEE) (N/A) as a surgical intervention.  The patient's history has been reviewed, patient examined, no change in status, stable for surgery.  I have reviewed the patient's chart and labs.  Questions were answered to the patient's satisfaction.     Chilton Si, MD

## 2022-01-14 NOTE — H&P (Signed)
Krystal Valdez is a 26 y.o. female who has presented today for surgery, with the diagnosis of bacteremia.  The various methods of treatment have been discussed with the patient and family. After consideration of risks, benefits and other options for treatment, the patient has consented to  Procedure(s): TRANSESOPHAGEAL ECHOCARDIOGRAM (TEE) (N/A) as a surgical intervention .  The patient's history has been reviewed, patient examined, no change in status, stable for surgery.  I have reviewed the patient's chart and labs.  Questions were answered to the patient's satisfaction.    Dajohn Ellender C. Duke Salvia, MD, [facility]  01/14/2022 9:59 AM

## 2022-01-15 DIAGNOSIS — M4656 Other infective spondylopathies, lumbar region: Secondary | ICD-10-CM | POA: Diagnosis not present

## 2022-01-15 LAB — CULTURE, BLOOD (ROUTINE X 2): Special Requests: ADEQUATE

## 2022-01-15 LAB — HEPATITIS B SURFACE ANTIGEN: Hepatitis B Surface Ag: NONREACTIVE

## 2022-01-15 LAB — HEPATITIS A ANTIBODY, TOTAL: hep A Total Ab: REACTIVE — AB

## 2022-01-15 LAB — HEPATITIS B CORE ANTIBODY, TOTAL: Hep B Core Total Ab: NONREACTIVE

## 2022-01-15 MED ORDER — OXYCODONE HCL 5 MG PO TABS
5.0000 mg | ORAL_TABLET | ORAL | Status: DC | PRN
  Administered 2022-01-15 – 2022-01-16 (×4): 5 mg via ORAL
  Filled 2022-01-15 (×4): qty 1

## 2022-01-15 NOTE — Progress Notes (Signed)
Pt found vaping in her room, explained hospital policy she gave this writer the vap and stated it could be thrown away.

## 2022-01-15 NOTE — Progress Notes (Signed)
Mobility Specialist Progress Note   01/15/22 1545  Mobility  Activity Ambulated independently in hallway  Level of Assistance Independent after set-up  Assistive Device None  Distance Ambulated (ft) 550 ft  Activity Response Tolerated well  $Mobility charge 1 Mobility   Received pt in bed w/ min LBP but agreeable to mobility. Asymptomatic throughout ambulation, returned back to bed w/ call bell in reach and all needs met.  [place] Falling Mobility Specialist Phone Number [phone]

## 2022-01-15 NOTE — Progress Notes (Signed)
PROGRESS NOTE Krystal Valdez  ATF:[id number] DOB: 1996-02-01 DOA: 01/10/2022 PCP: Pcp, No   Brief Narrative/Hospital Course: 26 year old female history of IV drug use, mood disorder presented with 3-day history of lower back pain, work-up showed leukocytosis and elevated sed rate MRI T and L-spine concerning for septic arthritis at L2 -L3 and  10x15 mm fluid collection. Neurosurg rec IR and ID consult. IR consulted for aspiration and too small for aspiration.  ID consulted and helping with antibiotic management.  2D echo ordered no acute finding.  Patient subsequently underwent TEE EF 60 to 65%, no LA/LAA thrombus or masses and no endocarditis noted trivial MR and trivial TR.  Patient remains hospitalized for ongoing IV antibiotics    Subjective: Seen and examined this morning.  Complains of some back pain last night which reports he did not receive oxycodone but only Toradol which helped some, asking for pain medicine.   Tolerating diet no nausea vomiting today.    Assessment and Plan: Principal Problem:   Septic arthritis of lumbar spine (HCC) Active Problems:   Staphylococcus aureus bacteremia   Drug abuse and dependence (HCC)   Hematuria   UTI (urinary tract infection)   Septic arthritis of vertebra (HCC)   Hypokalemia   Microcytic anemia   Hepatitis C virus infection   Septic arthritis of lumbar spine L2 -L3 and probably 10x15 mm fluid collection and myositis: MSSA bacteremia: Repeat blood culture 5/31 no growth so far so far, TEE- EF 60 to 65%, no LA/LAA thrombus or masses and no endocarditis noted trivial MR and trivial TR.  Patient remains hospitalized for ongoing IV antibiotics-at least need 2 weeks if not longer await for further ID inputs.continue IV Ancef.  IVDA: Patient admits to taking heroin and methamphetamine.  Cessation has been discussed.  TOC consulted.  HIV negative RPR negative HCV antibody positive.Minimize opiates, continue increased dose Atarax  iv Toradol , IV  Robaxin for further anxiety and pain management.  Continue low-dose Xanax.  Hepatitis C HCVRNA detectable at 30 copies.  CBD clearing on her own will need HCVRNA in 3 months   UTI continue antibiotics as above. Hypokalemia: Resolved Microcytic anemia hemoglobin stable in 10 g.  Monitor  I had requested patient to stay in the hospital to complete IV antibiotics until she is cleared for discharge, advised not to leave AGAINST MEDICAL ADVICE as it may lead to further worsening of infection that could be disabling,  leading to paraplegia or even death.  Patient verbalized understanding  DVT prophylaxis: enoxaparin (LOVENOX) injection 40 mg Start: 01/10/22 2030 Code Status:   Code Status: Full Code Family Communication: plan of care discussed with patient at bedside. Patient status is: Inpatient because of further work-up and to rule out endocarditis Level of care: Med-Surg   Dispo: The patient is from: Home            Anticipated disposition: Home after cleared by ID  on completion of IV antibiotics  Mobility Assessment (last 72 hours)     Mobility Assessment     Row Name 01/14/22 1926 01/13/22 2250 01/13/22 0835 01/12/22 1950     Does patient have an order for bedrest or is patient medically unstable No - Continue assessment No - Continue assessment No - Continue assessment No - Continue assessment    What is the highest level of mobility based on the progressive mobility assessment? Level 5 (Walks with assist in room/hall) - Balance while stepping forward/back and can walk in room with assist -  Complete Level 5 (Walks with assist in room/hall) - Balance while stepping forward/back and can walk in room with assist - Complete Level 6 (Walks independently in room and hall) - Balance while walking in room without assist - Complete Level 5 (Walks with assist in room/hall) - Balance while stepping forward/back and can walk in room with assist - Complete    Is the above level different from  baseline mobility prior to current illness? No - Consider discontinuing PT/OT No - Consider discontinuing PT/OT No - Consider discontinuing PT/OT No - Consider discontinuing PT/OT              Objective: Vitals last 24 hrs: Vitals:   01/14/22 1515 01/14/22 1841 01/14/22 2040 01/15/22 0542  BP:   109/60 130/79  Pulse:   88 89  Resp: 16 18 16 18   Temp:   97.8 F (36.6 C) 97.9 F (36.6 C)  TempSrc:   Oral Oral  SpO2:   99% 99%  Weight:      Height:       Weight change:   Physical Examination: General exam: AA0x3,older than stated age, weak appearing. HEENT:Oral mucosa moist, Ear/Nose WNL grossly, dentition normal. Respiratory system: bilaterally diminished,no use of accessory muscle Cardiovascular system: S1 & S2 +, No JVD,. Gastrointestinal system: Abdomen soft,NT,ND, BS+ Nervous System:Alert, awake, moving LE bilaterally well, no sensory deficit.   Extremities: edema neg,distal peripheral pulses palpable.  Skin: No rashes,no icterus. MSK: Normal muscle bulk,tone, power   Medications reviewed:  Scheduled Meds:  enoxaparin (LOVENOX) injection  40 mg Subcutaneous Q24H   nicotine  21 mg Transdermal Daily   senna-docusate  1 tablet Oral BID   sodium chloride flush  3 mL Intravenous Q12H   Continuous Infusions:  sodium chloride      ceFAZolin (ANCEF) IV 2 g (01/15/22 0851)   methocarbamol (ROBAXIN) IV 500 mg (01/15/22 1014)    Diet Order             Diet regular Room service appropriate? Yes; Fluid consistency: Thin  Diet effective now                   Intake/Output Summary (Last 24 hours) at 01/15/2022 1017 Last data filed at 01/14/2022 1100 Gross per 24 hour  Intake 200 ml  Output --  Net 200 ml   Net IO Since Admission: 2,578.98 mL [01/15/22 1017]  Wt Readings from Last 3 Encounters:  01/14/22 65.8 kg  08/04/16 65.8 kg  06/29/16 68 kg     Unresulted Labs (From admission, onward)     Start     Ordered   01/15/22 0500  Hepatitis B core antibody,  total  Tomorrow morning,   R       Question:  Specimen collection method  Answer:  Lab=Lab collect   01/14/22 1112   01/15/22 0500  Hepatitis B surface antibody,quantitative  Tomorrow morning,   R       Question:  Specimen collection method  Answer:  Lab=Lab collect   01/14/22 1112   01/15/22 0500  Hepatitis A antibody, total  Tomorrow morning,   R       Question:  Specimen collection method  Answer:  Lab=Lab collect   01/14/22 1112          Data Reviewed: I have personally reviewed following labs and imaging studies CBC: Recent Labs  Lab 01/10/22 0512 01/11/22 0412 01/12/22 0555 01/14/22 0943  WBC 10.7* 5.7 4.4 6.2  NEUTROABS 8.1*  --  3.0  --   HGB 11.1* 10.4* 10.0* 10.2*  HCT 34.2* 30.2* 30.1* 30.9*  MCV 79.9* 76.8* 78.0* 78.2*  PLT 330 292 285 388   Basic Metabolic Panel: Recent Labs  Lab 01/10/22 0512 01/11/22 0412 01/12/22 0555 01/14/22 0242  NA 136 131* 134* 138  K 3.8 3.3* 3.1* 3.8  CL 100 98 100 103  CO2 GLUCOSE 94 114* 101* 104*  BUN CREATININE 0.49 0.50 0.58 0.56  CALCIUM 9.2 8.9 8.8* 8.8*  MG  --  2.1  --   --    GFR: Estimated Creatinine Clearance: 102.7 mL/min (by C-G formula based on SCr of 0.56 mg/dL). Liver Function Tests: Recent Labs  Lab 01/10/22 0512  AST 14*  ALT 13  ALKPHOS 71  BILITOT 0.5  PROT 8.2*  ALBUMIN 3.8   No results for input(s): LIPASE, AMYLASE in the last 168 hours. No results for input(s): AMMONIA in the last 168 hours. Coagulation Profile: No results for input(s): INR, PROTIME in the last 168 hours. BNP (last 3 results) No results for input(s): PROBNP in the last 8760 hours. HbA1C: No results for input(s): HGBA1C in the last 72 hours. CBG: No results for input(s): GLUCAP in the last 168 hours. Lipid Profile: No results for input(s): CHOL, HDL, LDLCALC, TRIG, CHOLHDL, LDLDIRECT in the last 72 hours. Thyroid Function Tests: No results for input(s): TSH, T4TOTAL, FREET4, T3FREE, THYROIDAB  in the last 72 hours. Sepsis Labs: Recent Labs  Lab 01/10/22 1610  LATICACIDVEN 1.0    Recent Results (from the past 240 hour(s))  Blood culture (routine x 2)     Status: Abnormal   Collection Time: 01/10/22  5:12 AM   Specimen: BLOOD  Result Value Ref Range Status   Specimen Description   Final    BLOOD LEFT ANTECUBITAL Performed at [facility], [address], [place], [place] [address]    Special Requests   Final    BOTTLES DRAWN AEROBIC AND ANAEROBIC Blood Culture results may not be optimal due to an inadequate volume of blood received in culture bottles Performed at [facility], [address] [place], [place] [address]    Culture  Setup Time   Final    GRAM POSITIVE COCCI AEROBIC BOTTLE ONLY CRITICAL RESULT CALLED TO, READ BACK BY AND VERIFIED WITH: PHARMD TONY  RUDISILL ON 01/10/22 @ 2309 BY DRT Performed at [facility], [address] [place], [place] [address]    Culture STAPHYLOCOCCUS AUREUS (A)  Final   Report Status 01/12/2022 FINAL  Final   Organism ID, Bacteria STAPHYLOCOCCUS AUREUS  Final      Susceptibility   Staphylococcus aureus - MIC*    CIPROFLOXACIN <=0.5 SENSITIVE Sensitive     ERYTHROMYCIN <=0.25 SENSITIVE Sensitive     GENTAMICIN <=0.5 SENSITIVE Sensitive     OXACILLIN 0.5 SENSITIVE Sensitive     TETRACYCLINE <=1 SENSITIVE Sensitive     VANCOMYCIN 1 SENSITIVE Sensitive     TRIMETH/SULFA <=10 SENSITIVE Sensitive     CLINDAMYCIN <=0.25 SENSITIVE Sensitive     RIFAMPIN <=0.5 SENSITIVE Sensitive     Inducible Clindamycin NEGATIVE Sensitive     * STAPHYLOCOCCUS AUREUS  Blood Culture ID Panel (Reflexed)     Status: Abnormal   Collection Time: 01/10/22  5:12 AM  Result Value Ref Range Status   Enterococcus faecalis NOT DETECTED NOT DETECTED Final   Enterococcus Faecium NOT DETECTED NOT DETECTED Final  Listeria monocytogenes NOT DETECTED NOT DETECTED Final   Staphylococcus species DETECTED (A) NOT  DETECTED Final    Comment: CRITICAL RESULT CALLED TO, READ BACK BY AND VERIFIED WITH: PHARMD TONY  RUDISILL ON 01/10/22 @ 2309 BY DRT    Staphylococcus aureus (BCID) DETECTED (A) NOT DETECTED Final    Comment: CRITICAL RESULT CALLED TO, READ BACK BY AND VERIFIED WITH: PHARMD TONY  RUDISILL ON 01/10/22 @ 2309 BY DRT    Staphylococcus epidermidis NOT DETECTED NOT DETECTED Final   Staphylococcus lugdunensis NOT DETECTED NOT DETECTED Final   Streptococcus species NOT DETECTED NOT DETECTED Final   Streptococcus agalactiae NOT DETECTED NOT DETECTED Final   Streptococcus pneumoniae NOT DETECTED NOT DETECTED Final   Streptococcus pyogenes NOT DETECTED NOT DETECTED Final   A.calcoaceticus-baumannii NOT DETECTED NOT DETECTED Final   Bacteroides fragilis NOT DETECTED NOT DETECTED Final   Enterobacterales NOT DETECTED NOT DETECTED Final   Enterobacter cloacae complex NOT DETECTED NOT DETECTED Final   Escherichia coli NOT DETECTED NOT DETECTED Final   Klebsiella aerogenes NOT DETECTED NOT DETECTED Final   Klebsiella oxytoca NOT DETECTED NOT DETECTED Final   Klebsiella pneumoniae NOT DETECTED NOT DETECTED Final   Proteus species NOT DETECTED NOT DETECTED Final   Salmonella species NOT DETECTED NOT DETECTED Final   Serratia marcescens NOT DETECTED NOT DETECTED Final   Haemophilus influenzae NOT DETECTED NOT DETECTED Final   Neisseria meningitidis NOT DETECTED NOT DETECTED Final   Pseudomonas aeruginosa NOT DETECTED NOT DETECTED Final   Stenotrophomonas maltophilia NOT DETECTED NOT DETECTED Final   Candida albicans NOT DETECTED NOT DETECTED Final   Candida auris NOT DETECTED NOT DETECTED Final   Candida glabrata NOT DETECTED NOT DETECTED Final   Candida krusei NOT DETECTED NOT DETECTED Final   Candida parapsilosis NOT DETECTED NOT DETECTED Final   Candida tropicalis NOT DETECTED NOT DETECTED Final   Cryptococcus neoformans/gattii NOT DETECTED NOT DETECTED Final   Meth resistant mecA/C and MREJ NOT  DETECTED NOT DETECTED Final    Comment: Performed at [facility], [address] [place], [place] [address]  Blood culture (routine x 2)     Status: Abnormal   Collection Time: 01/10/22  5:24 AM   Specimen: BLOOD  Result Value Ref Range Status   Specimen Description   Final    BLOOD RIGHT ANTECUBITAL Performed at [facility], [address] [place], [place] [address]    Special Requests   Final    BOTTLES DRAWN AEROBIC AND ANAEROBIC Blood Culture adequate volume Performed at [facility], [address] [place], [place] [address]    Culture  Setup Time   Final    GRAM POSITIVE COCCI ANAEROBIC BOTTLE ONLY CRITICAL VALUE NOTED.  VALUE IS CONSISTENT WITH PREVIOUSLY REPORTED AND CALLED VALUE.    Culture (A)  Final    STAPHYLOCOCCUS AUREUS SUSCEPTIBILITIES PERFORMED ON PREVIOUS CULTURE WITHIN THE LAST 5 DAYS. Performed at [facility], [address] [place], [place] [address]    Report Status 01/15/2022 FINAL  Final  Culture, blood (Routine X 2) w Reflex to ID Panel     Status: None (Preliminary result)   Collection Time: 01/12/22  5:55 AM   Specimen: BLOOD LEFT HAND  Result Value Ref Range Status   Specimen Description BLOOD LEFT HAND  Final   Special Requests   Final    BOTTLES DRAWN AEROBIC AND ANAEROBIC Blood Culture adequate volume   Culture   Final    NO  GROWTH 3 DAYS Performed at [facility], [address] [place], [place] [address]    Report Status PENDING  Incomplete  Culture, blood (Routine X 2) w Reflex to ID Panel     Status: None (Preliminary result)   Collection Time: 01/12/22  5:55 AM   Specimen: BLOOD RIGHT HAND  Result Value Ref Range Status   Specimen Description BLOOD RIGHT HAND  Final   Special Requests   Final    BOTTLES DRAWN AEROBIC AND ANAEROBIC Blood Culture adequate volume   Culture   Final    NO GROWTH 3 DAYS Performed at [facility], [address], [place], [place]  [address]    Report Status PENDING  Incomplete     Antimicrobials: Anti-infectives (From admission, onward)    Start     Dose/Rate Route Frequency Ordered Stop   01/11/22 0930  ceFAZolin (ANCEF) IVPB 2g/100 mL premix        2 g 200 mL/hr over 30 Minutes Intravenous Every 8 hours 01/11/22 0831     01/10/22 1345  ceFEPIme (MAXIPIME) 2 g in sodium chloride 0.9 % 100 mL IVPB        2 g 200 mL/hr over 30 Minutes Intravenous  Once 01/10/22 1330 01/10/22 1423   01/10/22 1345  vancomycin (VANCOCIN) IVPB 1000 mg/200 mL premix        1,000 mg 200 mL/hr over 60 Minutes Intravenous  Once 01/10/22 1343 01/10/22 1601   01/10/22 1145  ceFEPIme (MAXIPIME) 2 g in sodium chloride 0.9 % 100 mL IVPB  Status:  Discontinued        2 g 200 mL/hr over 30 Minutes Intravenous  Once 01/10/22 1143 01/10/22 1146      Culture/Microbiology    Component Value Date/Time   SDES BLOOD LEFT HAND 01/12/2022 0555   SDES BLOOD RIGHT HAND 01/12/2022 0555   SPECREQUEST  01/12/2022 0555    BOTTLES DRAWN AEROBIC AND ANAEROBIC Blood Culture adequate volume   SPECREQUEST  01/12/2022 0555    BOTTLES DRAWN AEROBIC AND ANAEROBIC Blood Culture adequate volume   CULT  01/12/2022 0555    NO GROWTH 3 DAYS Performed at [facility], [address], [place], [place] [address]    CULT  01/12/2022 0555    NO GROWTH 3 DAYS Performed at [facility], [address] [place], [place] [address]    REPTSTATUS PENDING 01/12/2022 0555   REPTSTATUS PENDING 01/12/2022 0555    Other culture-see note  Radiology Studies: ECHO TEE  Result Date: 01/14/2022    TRANSESOPHOGEAL ECHO REPORT   Patient Name:   Krystal Valdez Date of Exam: 01/14/2022 Medical Rec #:  [id number]       Height:       67.0 in Accession #:    [id number]      Weight:       145.0 lb Date of Birth:  14-Aug-1996      BSA:          1.764 m Patient Age:    25 years        BP:           126/94 mmHg Patient Gender: F               HR:           70 bpm. Exam Location:   Inpatient Procedure: Transesophageal Echo, Color Doppler and Cardiac Doppler Indications:    Bacteremia  History:  Patient has prior history of Echocardiogram examinations, most                 recent 01/11/2022. Risk Factors:IVDU.  Sonographer:    Milda Smart Referring Phys: [id number] Jonita Albee PROCEDURE: After discussion of the risks and benefits of a TEE, an informed consent was obtained from the patient. The transesophogeal probe was passed without difficulty through the esophogus of the patient. Sedation performed by different physician. The patient was monitored while under deep sedation. Anesthestetic sedation was provided intravenously by Anesthesiology: 282.  of Propofol. Image quality was good. The patient's vital signs; including heart rate, blood pressure, and oxygen saturation; remained stable throughout the procedure. The patient developed no complications during the procedure. IMPRESSIONS  1. Left ventricular ejection fraction, by estimation, is 60 to 65%. The left ventricle has normal function. The left ventricle has no regional wall motion abnormalities.  2. Right ventricular systolic function is normal. The right ventricular size is normal.  3. No left atrial/left atrial appendage thrombus was detected.  4. The mitral valve is normal in structure. Trivial mitral valve regurgitation. No evidence of mitral stenosis.  5. The aortic valve is tricuspid. Aortic valve regurgitation is not visualized. No aortic stenosis is present.  6. The inferior vena cava is normal in size with greater than 50% respiratory variability, suggesting right atrial pressure of 3 mmHg. Conclusion(s)/Recommendation(s): Normal biventricular function without evidence of hemodynamically significant valvular heart disease. No evidence of vegetation/infective endocarditis on this transesophageael echocardiogram. FINDINGS  Left Ventricle: Left ventricular ejection fraction, by estimation, is 60 to 65%. The left  ventricle has normal function. The left ventricle has no regional wall motion abnormalities. The left ventricular internal cavity size was normal in size. There is  no left ventricular hypertrophy. Right Ventricle: The right ventricular size is normal. No increase in right ventricular wall thickness. Right ventricular systolic function is normal. Left Atrium: Left atrial size was normal in size. No left atrial/left atrial appendage thrombus was detected. Right Atrium: Right atrial size was normal in size. Pericardium: There is no evidence of pericardial effusion. Mitral Valve: The mitral valve is normal in structure. Trivial mitral valve regurgitation. No evidence of mitral valve stenosis. Tricuspid Valve: The tricuspid valve is normal in structure. Tricuspid valve regurgitation is not demonstrated. No evidence of tricuspid stenosis. Aortic Valve: The aortic valve is tricuspid. Aortic valve regurgitation is not visualized. No aortic stenosis is present. Pulmonic Valve: The pulmonic valve was normal in structure. Pulmonic valve regurgitation is not visualized. No evidence of pulmonic stenosis. Aorta: The aortic root is normal in size and structure. Venous: The inferior vena cava is normal in size with greater than 50% respiratory variability, suggesting right atrial pressure of 3 mmHg. IAS/Shunts: No atrial level shunt detected by color flow Doppler. Chilton Si MD Electronically signed by Chilton Si MD Signature Date/Time: 01/14/2022/1:42:41 PM    Final      LOS: 5 days   Lanae Boast, MD [facility]  01/15/2022, 10:17 AM

## 2022-01-16 DIAGNOSIS — M4656 Other infective spondylopathies, lumbar region: Secondary | ICD-10-CM | POA: Diagnosis not present

## 2022-01-16 LAB — HEPATITIS B SURFACE ANTIBODY, QUANTITATIVE: Hep B S AB Quant (Post): <3.1 m[IU]/mL — ABNORMAL LOW (ref >9.9)

## 2022-01-16 MED ORDER — QUETIAPINE FUMARATE 25 MG PO TABS
50.0000 mg | ORAL_TABLET | Freq: Every day | ORAL | Status: DC
  Administered 2022-01-16: 50 mg via ORAL
  Filled 2022-01-16: qty 2

## 2022-01-16 MED ORDER — NAPROXEN 250 MG PO TABS
500.0000 mg | ORAL_TABLET | Freq: Two times a day (BID) | ORAL | Status: AC
  Administered 2022-01-16 – 2022-01-21 (×10): 500 mg via ORAL
  Filled 2022-01-16 (×11): qty 2

## 2022-01-16 MED ORDER — OXYCODONE HCL 5 MG PO TABS
5.0000 mg | ORAL_TABLET | ORAL | Status: AC | PRN
  Administered 2022-01-16 – 2022-01-19 (×12): 10 mg via ORAL
  Filled 2022-01-16 (×12): qty 2

## 2022-01-16 NOTE — Progress Notes (Signed)
PROGRESS NOTE Krystal Valdez  ZOX:[id number] DOB: 1995/11/16 DOA: 01/10/2022 PCP: Pcp, No   Brief Narrative/Hospital Course: 26 year old female history of IV drug use, mood disorder presented with 3-day history of lower back pain, work-up showed leukocytosis and elevated sed rate MRI T and L-spine concerning for septic arthritis at L2 -L3 and  10x15 mm fluid collection. Neurosurg rec IR and ID consult. IR consulted for aspiration and too small for aspiration.  ID consulted and helping with antibiotic management.  2D echo ordered no acute finding.  Patient subsequently underwent TEE EF 60 to 65%, no LA/LAA thrombus or masses and no endocarditis noted trivial MR and trivial TR.  Patient remains hospitalized for ongoing IV antibiotics    Subjective: Seen this am C/o back pain about the same,able to shower well this morning. She was vaping in room yesterday.  Assessment and Plan: Principal Problem:   Septic arthritis of lumbar spine (HCC) Active Problems:   Staphylococcus aureus bacteremia   Drug abuse and dependence (HCC)   Hematuria   UTI (urinary tract infection)   Septic arthritis of vertebra (HCC)   Hypokalemia   Microcytic anemia   Hepatitis C virus infection   Septic arthritis of lumbar spine L2 -L3 and probably 10x15 mm fluid collection and myositis: MSSA bacteremia: Repeat blood culture 5/31 no growth so far so far, TEE- EF 60 to 65%, no LA/LAA thrombus or masses and no endocarditis noted trivial MR and trivial TR.  Patient remains hospitalized for ongoing IV antibiotics-at least need 2 weeks if not longer await for further ID inputs.continue IV Ancef.  IVDA: Patient admits to taking heroin and methamphetamine.  Cessation has been discussed.  TOC consulted.  HIV negative RPR negative HCV antibody positive.Minimize opiates, continue Atarax IV, Toradol, Robaxin. Cont Oral oxycodone for pain control.    Hepatitis C HCVRNA detectable at 30 copies.  CBD clearing on her own will need  HCVRNA in 3 months   UTI continue antibiotics as above. Hypokalemia: Resolved Microcytic anemia hemoglobin stable in 10 g.  Monitor  I had requested patient to stay in the hospital to complete IV antibiotics until she is cleared for discharge, advised not to leave AGAINST MEDICAL ADVICE as it may lead to further worsening of infection that could be disabling,  leading to paraplegia or even death.  Patient verbalized understanding  DVT prophylaxis: enoxaparin (LOVENOX) injection 40 mg Start: 01/10/22 2030 Code Status:   Code Status: Full Code Family Communication: plan of care discussed with patient at bedside. Patient status is: Inpatient because of further work-up and to rule out endocarditis Level of care: Med-Surg   Dispo: The patient is from: Home            Anticipated disposition: Home after cleared by ID  on completion of IV antibiotics.  Continue TeleSitter for safety precaution as patient was found doing vaping in the room  Mobility Assessment (last 72 hours)     Mobility Assessment     Row Name 01/15/22 0855 01/14/22 1926 01/13/22 2250       Does patient have an order for bedrest or is patient medically unstable No - Continue assessment No - Continue assessment No - Continue assessment     What is the highest level of mobility based on the progressive mobility assessment? Level 6 (Walks independently in room and hall) - Balance while walking in room without assist - Complete Level 5 (Walks with assist in room/hall) - Balance while stepping forward/back and can walk in room  with assist - Complete Level 5 (Walks with assist in room/hall) - Balance while stepping forward/back and can walk in room with assist - Complete     Is the above level different from baseline mobility prior to current illness? No - Consider discontinuing PT/OT No - Consider discontinuing PT/OT No - Consider discontinuing PT/OT               Objective: Vitals last 24 hrs: Vitals:   01/15/22 1547  01/15/22 1930 01/16/22 0421 01/16/22 0807  BP: (!) 148/96 (!) 153/80 130/73 136/82  Pulse: 95 99 90 99  Resp: 18 18  16   Temp: 98 F (36.7 C) 98 F (36.7 C) 98.2 F (36.8 C) 98.1 F (36.7 C)  TempSrc: Oral Oral  Oral  SpO2: 98% 100% 99% 100%  Weight:      Height:       Weight change:   Physical Examination: Exam deferred as patient was in the shower She is ambulatory alert awake oriented.  Medications reviewed:  Scheduled Meds:  enoxaparin (LOVENOX) injection  40 mg Subcutaneous Q24H   nicotine  21 mg Transdermal Daily   senna-docusate  1 tablet Oral BID   sodium chloride flush  3 mL Intravenous Q12H   Continuous Infusions:  sodium chloride      ceFAZolin (ANCEF) IV 2 g (01/16/22 0138)   methocarbamol (ROBAXIN) IV Stopped (01/15/22 1445)    Diet Order             Diet regular Room service appropriate? Yes; Fluid consistency: Thin  Diet effective now                  No intake or output data in the 24 hours ending 01/16/22 1121  Net IO Since Admission: 2,578.98 mL [01/16/22 1121]  Wt Readings from Last 3 Encounters:  01/14/22 65.8 kg  08/04/16 65.8 kg  06/29/16 68 kg     Unresulted Labs (From admission, onward)     Start     Ordered   01/15/22 0500  Hepatitis B surface antibody,quantitative  Tomorrow morning,   R       Question:  Specimen collection method  Answer:  Lab=Lab collect   01/14/22 1112          Data Reviewed: I have personally reviewed following labs and imaging studies CBC: Recent Labs  Lab 01/10/22 0512 01/11/22 0412 01/12/22 0555 01/14/22 0943  WBC 10.7* 5.7 4.4 6.2  NEUTROABS 8.1*  --  3.0  --   HGB 11.1* 10.4* 10.0* 10.2*  HCT 34.2* 30.2* 30.1* 30.9*  MCV 79.9* 76.8* 78.0* 78.2*  PLT 330 292 285 388    Basic Metabolic Panel: Recent Labs  Lab 01/10/22 0512 01/11/22 0412 01/12/22 0555 01/14/22 0242  NA 136 131* 134* 138  K 3.8 3.3* 3.1* 3.8  CL 100 98 100 103  CO2 27 23 23 23   GLUCOSE 94 114* 101* 104*  BUN 12 7 6  9   CREATININE 0.49 0.50 0.58 0.56  CALCIUM 9.2 8.9 8.8* 8.8*  MG  --  2.1  --   --     GFR: Estimated Creatinine Clearance: 102.7 mL/min (by C-G formula based on SCr of 0.56 mg/dL). Liver Function Tests: Recent Labs  Lab 01/10/22 0512  AST 14*  ALT 13  ALKPHOS 71  BILITOT 0.5  PROT 8.2*  ALBUMIN 3.8    No results for input(s): LIPASE, AMYLASE in the last 168 hours. No results for input(s): AMMONIA in the last 168  hours. Coagulation Profile: No results for input(s): INR, PROTIME in the last 168 hours. BNP (last 3 results) No results for input(s): PROBNP in the last 8760 hours. HbA1C: No results for input(s): HGBA1C in the last 72 hours. CBG: No results for input(s): GLUCAP in the last 168 hours. Lipid Profile: No results for input(s): CHOL, HDL, LDLCALC, TRIG, CHOLHDL, LDLDIRECT in the last 72 hours. Thyroid Function Tests: No results for input(s): TSH, T4TOTAL, FREET4, T3FREE, THYROIDAB in the last 72 hours. Sepsis Labs: Recent Labs  Lab 01/10/22 1610  LATICACIDVEN 1.0     Recent Results (from the past 240 hour(s))  Blood culture (routine x 2)     Status: Abnormal   Collection Time: 01/10/22  5:12 AM   Specimen: BLOOD  Result Value Ref Range Status   Specimen Description   Final    BLOOD LEFT ANTECUBITAL Performed at [facility], [address], [place], [place] [address]    Special Requests   Final    BOTTLES DRAWN AEROBIC AND ANAEROBIC Blood Culture results may not be optimal due to an inadequate volume of blood received in culture bottles Performed at [facility], [address] [place], [place] [address]    Culture  Setup Time   Final    GRAM POSITIVE COCCI AEROBIC BOTTLE ONLY CRITICAL RESULT CALLED TO, READ BACK BY AND VERIFIED WITH: PHARMD TONY  RUDISILL ON 01/10/22 @ 2309 BY DRT Performed at [facility], [address], [place], [place] [address]    Culture STAPHYLOCOCCUS AUREUS (A)  Final   Report  Status 01/12/2022 FINAL  Final   Organism ID, Bacteria STAPHYLOCOCCUS AUREUS  Final      Susceptibility   Staphylococcus aureus - MIC*    CIPROFLOXACIN <=0.5 SENSITIVE Sensitive     ERYTHROMYCIN <=0.25 SENSITIVE Sensitive     GENTAMICIN <=0.5 SENSITIVE Sensitive     OXACILLIN 0.5 SENSITIVE Sensitive     TETRACYCLINE <=1 SENSITIVE Sensitive     VANCOMYCIN 1 SENSITIVE Sensitive     TRIMETH/SULFA <=10 SENSITIVE Sensitive     CLINDAMYCIN <=0.25 SENSITIVE Sensitive     RIFAMPIN <=0.5 SENSITIVE Sensitive     Inducible Clindamycin NEGATIVE Sensitive     * STAPHYLOCOCCUS AUREUS  Blood Culture ID Panel (Reflexed)     Status: Abnormal   Collection Time: 01/10/22  5:12 AM  Result Value Ref Range Status   Enterococcus faecalis NOT DETECTED NOT DETECTED Final   Enterococcus Faecium NOT DETECTED NOT DETECTED Final   Listeria monocytogenes NOT DETECTED NOT DETECTED Final   Staphylococcus species DETECTED (A) NOT DETECTED Final    Comment: CRITICAL RESULT CALLED TO, READ BACK BY AND VERIFIED WITH: PHARMD TONY  RUDISILL ON 01/10/22 @ 2309 BY DRT    Staphylococcus aureus (BCID) DETECTED (A) NOT DETECTED Final    Comment: CRITICAL RESULT CALLED TO, READ BACK BY AND VERIFIED WITH: PHARMD TONY  RUDISILL ON 01/10/22 @ 2309 BY DRT    Staphylococcus epidermidis NOT DETECTED NOT DETECTED Final   Staphylococcus lugdunensis NOT DETECTED NOT DETECTED Final   Streptococcus species NOT DETECTED NOT DETECTED Final   Streptococcus agalactiae NOT DETECTED NOT DETECTED Final   Streptococcus pneumoniae NOT DETECTED NOT DETECTED Final   Streptococcus pyogenes NOT DETECTED NOT DETECTED Final   A.calcoaceticus-baumannii NOT DETECTED NOT DETECTED Final   Bacteroides fragilis NOT DETECTED NOT DETECTED Final   Enterobacterales NOT DETECTED NOT DETECTED Final   Enterobacter cloacae complex NOT DETECTED NOT DETECTED Final   Escherichia  coli NOT DETECTED NOT DETECTED Final   Klebsiella aerogenes NOT DETECTED NOT DETECTED  Final   Klebsiella oxytoca NOT DETECTED NOT DETECTED Final   Klebsiella pneumoniae NOT DETECTED NOT DETECTED Final   Proteus species NOT DETECTED NOT DETECTED Final   Salmonella species NOT DETECTED NOT DETECTED Final   Serratia marcescens NOT DETECTED NOT DETECTED Final   Haemophilus influenzae NOT DETECTED NOT DETECTED Final   Neisseria meningitidis NOT DETECTED NOT DETECTED Final   Pseudomonas aeruginosa NOT DETECTED NOT DETECTED Final   Stenotrophomonas maltophilia NOT DETECTED NOT DETECTED Final   Candida albicans NOT DETECTED NOT DETECTED Final   Candida auris NOT DETECTED NOT DETECTED Final   Candida glabrata NOT DETECTED NOT DETECTED Final   Candida krusei NOT DETECTED NOT DETECTED Final   Candida parapsilosis NOT DETECTED NOT DETECTED Final   Candida tropicalis NOT DETECTED NOT DETECTED Final   Cryptococcus neoformans/gattii NOT DETECTED NOT DETECTED Final   Meth resistant mecA/C and MREJ NOT DETECTED NOT DETECTED Final    Comment: Performed at [facility], [address], [place], [place] [address]  Blood culture (routine x 2)     Status: Abnormal   Collection Time: 01/10/22  5:24 AM   Specimen: BLOOD  Result Value Ref Range Status   Specimen Description   Final    BLOOD RIGHT ANTECUBITAL Performed at [facility], [address], [place], [place] [address]    Special Requests   Final    BOTTLES DRAWN AEROBIC AND ANAEROBIC Blood Culture adequate volume Performed at [facility], [address] [place], [place] [address]    Culture  Setup Time   Final    GRAM POSITIVE COCCI ANAEROBIC BOTTLE ONLY CRITICAL VALUE NOTED.  VALUE IS CONSISTENT WITH PREVIOUSLY REPORTED AND CALLED VALUE.    Culture (A)  Final    STAPHYLOCOCCUS AUREUS SUSCEPTIBILITIES PERFORMED ON PREVIOUS CULTURE WITHIN THE LAST 5 DAYS. Performed at [facility], [address] [place], [place] [address]    Report Status 01/15/2022 FINAL  Final  Culture,  blood (Routine X 2) w Reflex to ID Panel     Status: None (Preliminary result)   Collection Time: 01/12/22  5:55 AM   Specimen: BLOOD LEFT HAND  Result Value Ref Range Status   Specimen Description BLOOD LEFT HAND  Final   Special Requests   Final    BOTTLES DRAWN AEROBIC AND ANAEROBIC Blood Culture adequate volume   Culture   Final    NO GROWTH 4 DAYS Performed at [facility], [address] [place], [place] [address]    Report Status PENDING  Incomplete  Culture, blood (Routine X 2) w Reflex to ID Panel     Status: None (Preliminary result)   Collection Time: 01/12/22  5:55 AM   Specimen: BLOOD RIGHT HAND  Result Value Ref Range Status   Specimen Description BLOOD RIGHT HAND  Final   Special Requests   Final    BOTTLES DRAWN AEROBIC AND ANAEROBIC Blood Culture adequate volume   Culture   Final    NO GROWTH 4 DAYS Performed at [facility], [address], [place], [place] [address]    Report Status PENDING  Incomplete     Antimicrobials: Anti-infectives (From admission, onward)    Start     Dose/Rate Route Frequency Ordered Stop   01/11/22 0930  ceFAZolin (ANCEF) IVPB 2g/100 mL premix        2 g 200  mL/hr over 30 Minutes Intravenous Every 8 hours 01/11/22 0831     01/10/22 1345  ceFEPIme (MAXIPIME) 2 g in sodium chloride 0.9 % 100 mL IVPB        2 g 200 mL/hr over 30 Minutes Intravenous  Once 01/10/22 1330 01/10/22 1423   01/10/22 1345  vancomycin (VANCOCIN) IVPB 1000 mg/200 mL premix        1,000 mg 200 mL/hr over 60 Minutes Intravenous  Once 01/10/22 1343 01/10/22 1601   01/10/22 1145  ceFEPIme (MAXIPIME) 2 g in sodium chloride 0.9 % 100 mL IVPB  Status:  Discontinued        2 g 200 mL/hr over 30 Minutes Intravenous  Once 01/10/22 1143 01/10/22 1146      Culture/Microbiology    Component Value Date/Time   SDES BLOOD LEFT HAND 01/12/2022 0555   SDES BLOOD RIGHT HAND 01/12/2022 0555   SPECREQUEST  01/12/2022 0555    BOTTLES DRAWN AEROBIC AND  ANAEROBIC Blood Culture adequate volume   SPECREQUEST  01/12/2022 0555    BOTTLES DRAWN AEROBIC AND ANAEROBIC Blood Culture adequate volume   CULT  01/12/2022 0555    NO GROWTH 4 DAYS Performed at [facility], [address] [place], [place] [address]    CULT  01/12/2022 0555    NO GROWTH 4 DAYS Performed at [facility], [address], [place], [place] [address]    REPTSTATUS PENDING 01/12/2022 0555   REPTSTATUS PENDING 01/12/2022 0555    Other culture-see note  Radiology Studies: ECHO TEE  Result Date: 01/14/2022    TRANSESOPHOGEAL ECHO REPORT   Patient Name:   Krystal Valdez Date of Exam: 01/14/2022 Medical Rec #:  [id number]       Height:       67.0 in Accession #:    [id number]      Weight:       145.0 lb Date of Birth:  1995-08-21      BSA:          1.764 m Patient Age:    25 years        BP:           126/94 mmHg Patient Gender: F               HR:           70 bpm. Exam Location:  Inpatient Procedure: Transesophageal Echo, Color Doppler and Cardiac Doppler Indications:    Bacteremia  History:        Patient has prior history of Echocardiogram examinations, most                 recent 01/11/2022. Risk Factors:IVDU.  Sonographer:    Milda Smart Referring Phys: [id number] Jonita Albee PROCEDURE: After discussion of the risks and benefits of a TEE, an informed consent was obtained from the patient. The transesophogeal probe was passed without difficulty through the esophogus of the patient. Sedation performed by different physician. The patient was monitored while under deep sedation. Anesthestetic sedation was provided intravenously by Anesthesiology: 282.86mg  of Propofol. Image quality was good. The patient's vital signs; including heart rate, blood pressure, and oxygen saturation; remained stable throughout the procedure. The patient developed no complications during the procedure. IMPRESSIONS  1. Left ventricular ejection fraction, by estimation, is 60 to 65%. The left  ventricle has normal function. The left ventricle has no regional wall motion abnormalities.  2. Right ventricular systolic function is normal. The right ventricular  size is normal.  3. No left atrial/left atrial appendage thrombus was detected.  4. The mitral valve is normal in structure. Trivial mitral valve regurgitation. No evidence of mitral stenosis.  5. The aortic valve is tricuspid. Aortic valve regurgitation is not visualized. No aortic stenosis is present.  6. The inferior vena cava is normal in size with greater than 50% respiratory variability, suggesting right atrial pressure of 3 mmHg. Conclusion(s)/Recommendation(s): Normal biventricular function without evidence of hemodynamically significant valvular heart disease. No evidence of vegetation/infective endocarditis on this transesophageael echocardiogram. FINDINGS  Left Ventricle: Left ventricular ejection fraction, by estimation, is 60 to 65%. The left ventricle has normal function. The left ventricle has no regional wall motion abnormalities. The left ventricular internal cavity size was normal in size. There is  no left ventricular hypertrophy. Right Ventricle: The right ventricular size is normal. No increase in right ventricular wall thickness. Right ventricular systolic function is normal. Left Atrium: Left atrial size was normal in size. No left atrial/left atrial appendage thrombus was detected. Right Atrium: Right atrial size was normal in size. Pericardium: There is no evidence of pericardial effusion. Mitral Valve: The mitral valve is normal in structure. Trivial mitral valve regurgitation. No evidence of mitral valve stenosis. Tricuspid Valve: The tricuspid valve is normal in structure. Tricuspid valve regurgitation is not demonstrated. No evidence of tricuspid stenosis. Aortic Valve: The aortic valve is tricuspid. Aortic valve regurgitation is not visualized. No aortic stenosis is present. Pulmonic Valve: The pulmonic valve was normal in  structure. Pulmonic valve regurgitation is not visualized. No evidence of pulmonic stenosis. Aorta: The aortic root is normal in size and structure. Venous: The inferior vena cava is normal in size with greater than 50% respiratory variability, suggesting right atrial pressure of 3 mmHg. IAS/Shunts: No atrial level shunt detected by color flow Doppler. Chilton Si MD Electronically signed by Chilton Si MD Signature Date/Time: 01/14/2022/1:42:41 PM    Final      LOS: 6 days   Lanae Boast, MD [facility]  01/16/2022, 11:21 AM

## 2022-01-17 ENCOUNTER — Encounter (HOSPITAL_COMMUNITY): Payer: Self-pay | Admitting: Cardiovascular Disease

## 2022-01-17 DIAGNOSIS — B957 Other staphylococcus as the cause of diseases classified elsewhere: Secondary | ICD-10-CM

## 2022-01-17 DIAGNOSIS — M4656 Other infective spondylopathies, lumbar region: Secondary | ICD-10-CM | POA: Diagnosis not present

## 2022-01-17 DIAGNOSIS — R7881 Bacteremia: Secondary | ICD-10-CM | POA: Diagnosis not present

## 2022-01-17 LAB — CULTURE, BLOOD (ROUTINE X 2)
Culture: NO GROWTH
Culture: NO GROWTH
Special Requests: ADEQUATE
Special Requests: ADEQUATE

## 2022-01-17 MED ORDER — QUETIAPINE FUMARATE 25 MG PO TABS
75.0000 mg | ORAL_TABLET | Freq: Every day | ORAL | Status: DC
  Administered 2022-01-17 – 2022-01-23 (×7): 75 mg via ORAL
  Filled 2022-01-17 (×8): qty 3

## 2022-01-17 NOTE — Plan of Care (Signed)
  Problem: Coping: Goal: Level of anxiety will decrease Outcome: Progressing   Problem: Pain Managment: Goal: General experience of comfort will improve Outcome: Progressing   Problem: Safety: Goal: Ability to remain free from injury will improve Outcome: Progressing   Problem: Skin Integrity: Goal: Risk for impaired skin integrity will decrease Outcome: Progressing   

## 2022-01-17 NOTE — Progress Notes (Signed)
PROGRESS NOTE Krystal Valdez  ZOX:[id number] DOB: 17-Dec-1995 DOA: 01/10/2022 PCP: Pcp, No   Brief Narrative/Hospital Course: 26 year old female history of IV drug use, mood disorder presented with 3-day history of lower back pain, work-up showed leukocytosis and elevated sed rate MRI T and L-spine concerning for septic arthritis at L2 -L3 and  10x15 mm fluid collection. Neurosurg rec IR and ID consult. IR consulted for aspiration and too small for aspiration.  ID consulted and helping with antibiotic management.  2D echo ordered no acute finding.  Patient subsequently underwent TEE EF 60 to 65%, no LA/LAA thrombus or masses and no endocarditis noted trivial MR and trivial TR.  Patient remains hospitalized for ongoing IV antibiotics. Found doing vaping in room  6/4- placed on tele sitter.    Subjective: Seen and examined this morning.  Alert oriented resting comfortably no fever.   She is asking if she can go home on oral antibiotics which are to discuss with infectious disease.    Assessment and Plan: Principal Problem:   Septic arthritis of lumbar spine (HCC) Active Problems:   Staphylococcus aureus bacteremia   Drug abuse and dependence (HCC)   Hematuria   UTI (urinary tract infection)   Septic arthritis of vertebra (HCC)   Hypokalemia   Microcytic anemia   Hepatitis C virus infection   Septic arthritis of lumbar spine L2 -L3 and probably 10x15 mm fluid collection and myositis: MSSA bacteremia: Repeat blood culture 5/31 NGTD,TEE- no IE.  Cont iv ancef as per ID, ? 2 wk iv antibiotics.  Unsafe to go home with PICC line 2/2 IVDA.  IVDA: Patient admits to taking heroin and methamphetamine.  Cessation has been discussed.  TOC consulted.  HIV negative RPR negative HCV antibody positive.Minimize opiates, continue Atarax IV, Toradol, Robaxin. Cont Oral oxycodone for pain control.    Hepatitis C HCVRNA detectable at 30 copies. HCV clearing on her own will need HCVRNA in 3 months.  Hep B  surface antigen negative hep B antibody less than 0.1 core hep b total antibody nonreactive, hep A antibody reactive  UTI continue antibiotics Hypokalemia: Resolved Microcytic anemia hemoglobin stable in 10 g.  Monitor  I had requested patient to stay in the hospital to complete IV antibiotics until she is cleared for discharge, advised not to leave AGAINST MEDICAL ADVICE as it may lead to further worsening of infection that could be disabling,  leading to paraplegia or even death.  Patient verbalized understanding  DVT prophylaxis: enoxaparin (LOVENOX) injection 40 mg Start: 01/10/22 2030 Code Status:   Code Status: Full Code Family Communication: plan of care discussed with patient at bedside. Patient status is: Inpatient because of further work-up and to rule out endocarditis Level of care: Med-Surg   Dispo: The patient is from: Home            Anticipated disposition: Home after cleared by ID  on completion of IV antibiotics.  Continue TeleSitter for safety precaution as patient was found doing vaping in the room  Mobility Assessment (last 72 hours)     Mobility Assessment     Row Name 01/16/22 2000 01/16/22 0920 01/15/22 0855 01/14/22 1926     Does patient have an order for bedrest or is patient medically unstable No - Continue assessment No - Continue assessment No - Continue assessment No - Continue assessment    What is the highest level of mobility based on the progressive mobility assessment? -- -- Level 6 (Walks independently in room and hall) -  Balance while walking in room without assist - Complete Level 5 (Walks with assist in room/hall) - Balance while stepping forward/back and can walk in room with assist - Complete    Is the above level different from baseline mobility prior to current illness? -- Yes - Recommend PT order No - Consider discontinuing PT/OT No - Consider discontinuing PT/OT              Objective: Vitals last 24 hrs: Vitals:   01/16/22 0807 01/16/22  2109 01/17/22 0324 01/17/22 0744  BP: 136/82 128/69 134/78 (!) 130/91  Pulse: 99 86 93 89  Resp: 16 14 16 17   Temp: 98.1 F (36.7 C) 98.4 F (36.9 C) 97.9 F (36.6 C) 98.3 F (36.8 C)  TempSrc: Oral Oral Oral Oral  SpO2: 100% 100% 100% 100%  Weight:      Height:       Weight change:   Physical Examination: General exam: AAOX3, older than stated age, weak appearing. HEENT:Oral mucosa moist, Ear/Nose WNL grossly, dentition normal. Respiratory system: bilaterally clear,no use of accessory muscle Cardiovascular system: S1 & S2 +, No JVD,. Gastrointestinal system: Abdomen soft,NT,ND,BS+ Nervous System:Alert, awake, moving extremities and grossly nonfocal Extremities: LE ankle edema Nneg, distal peripheral pulses palpable.  Skin: No rashes,no icterus. MSK: Normal muscle bulk,tone, power   Medications reviewed:  Scheduled Meds:  enoxaparin (LOVENOX) injection  40 mg Subcutaneous Q24H   naproxen  500 mg Oral BID WC   nicotine  21 mg Transdermal Daily   QUEtiapine  50 mg Oral QHS   senna-docusate  1 tablet Oral BID   sodium chloride flush  3 mL Intravenous Q12H   Continuous Infusions:  sodium chloride      ceFAZolin (ANCEF) IV 2 g (01/17/22 0856)   methocarbamol (ROBAXIN) IV Stopped (01/16/22 1400)    Diet Order             Diet regular Room service appropriate? Yes; Fluid consistency: Thin  Diet effective now                   Intake/Output Summary (Last 24 hours) at 01/17/2022 1033 Last data filed at 01/17/2022 0400 Gross per 24 hour  Intake 200 ml  Output 2 ml  Net 198 ml   Net IO Since Admission: 2,775.98 mL [01/17/22 1033]  Wt Readings from Last 3 Encounters:  01/14/22 65.8 kg  08/04/16 65.8 kg  06/29/16 68 kg     Unresulted Labs (From admission, onward)    None     Data Reviewed: I have personally reviewed following labs and imaging studies CBC: Recent Labs  Lab 01/11/22 0412 01/12/22 0555 01/14/22 0943  WBC 5.7 4.4 6.2  NEUTROABS  --  3.0  --    HGB 10.4* 10.0* 10.2*  HCT 30.2* 30.1* 30.9*  MCV 76.8* 78.0* 78.2*  PLT 292 285 388   Basic Metabolic Panel: Recent Labs  Lab 01/11/22 0412 01/12/22 0555 01/14/22 0242  NA 131* 134* 138  K 3.3* 3.1* 3.8  CL 98 100 103  CO2 23 23 23   GLUCOSE 114* 101* 104*  BUN 7 6 9   CREATININE 0.50 0.58 0.56  CALCIUM 8.9 8.8* 8.8*  MG 2.1  --   --    GFR: Estimated Creatinine Clearance: 102.7 mL/min (by C-G formula based on SCr of 0.56 mg/dL). Liver Function Tests: No results for input(s): AST, ALT, ALKPHOS, BILITOT, PROT, ALBUMIN in the last 168 hours.  No results for input(s): LIPASE, AMYLASE in the last  168 hours. No results for input(s): AMMONIA in the last 168 hours. Coagulation Profile: No results for input(s): INR, PROTIME in the last 168 hours. BNP (last 3 results) No results for input(s): PROBNP in the last 8760 hours. HbA1C: No results for input(s): HGBA1C in the last 72 hours. CBG: No results for input(s): GLUCAP in the last 168 hours. Lipid Profile: No results for input(s): CHOL, HDL, LDLCALC, TRIG, CHOLHDL, LDLDIRECT in the last 72 hours. Thyroid Function Tests: No results for input(s): TSH, T4TOTAL, FREET4, T3FREE, THYROIDAB in the last 72 hours. Sepsis Labs: No results for input(s): PROCALCITON, LATICACIDVEN in the last 168 hours.   Recent Results (from the past 240 hour(s))  Blood culture (routine x 2)     Status: Abnormal   Collection Time: 01/10/22  5:12 AM   Specimen: BLOOD  Result Value Ref Range Status   Specimen Description   Final    BLOOD LEFT ANTECUBITAL Performed at [facility], [address], [place], [place] [address]    Special Requests   Final    BOTTLES DRAWN AEROBIC AND ANAEROBIC Blood Culture results may not be optimal due to an inadequate volume of blood received in culture bottles Performed at [facility], [address] [place], [place] [address]    Culture  Setup Time   Final    GRAM POSITIVE  COCCI AEROBIC BOTTLE ONLY CRITICAL RESULT CALLED TO, READ BACK BY AND VERIFIED WITH: PHARMD TONY  RUDISILL ON 01/10/22 @ 2309 BY DRT Performed at [facility], [address] [place], [place] [address]    Culture STAPHYLOCOCCUS AUREUS (A)  Final   Report Status 01/12/2022 FINAL  Final   Organism ID, Bacteria STAPHYLOCOCCUS AUREUS  Final      Susceptibility   Staphylococcus aureus - MIC*    CIPROFLOXACIN <=0.5 SENSITIVE Sensitive     ERYTHROMYCIN <=0.25 SENSITIVE Sensitive     GENTAMICIN <=0.5 SENSITIVE Sensitive     OXACILLIN 0.5 SENSITIVE Sensitive     TETRACYCLINE <=1 SENSITIVE Sensitive     VANCOMYCIN 1 SENSITIVE Sensitive     TRIMETH/SULFA <=10 SENSITIVE Sensitive     CLINDAMYCIN <=0.25 SENSITIVE Sensitive     RIFAMPIN <=0.5 SENSITIVE Sensitive     Inducible Clindamycin NEGATIVE Sensitive     * STAPHYLOCOCCUS AUREUS  Blood Culture ID Panel (Reflexed)     Status: Abnormal   Collection Time: 01/10/22  5:12 AM  Result Value Ref Range Status   Enterococcus faecalis NOT DETECTED NOT DETECTED Final   Enterococcus Faecium NOT DETECTED NOT DETECTED Final   Listeria monocytogenes NOT DETECTED NOT DETECTED Final   Staphylococcus species DETECTED (A) NOT DETECTED Final    Comment: CRITICAL RESULT CALLED TO, READ BACK BY AND VERIFIED WITH: PHARMD TONY  RUDISILL ON 01/10/22 @ 2309 BY DRT    Staphylococcus aureus (BCID) DETECTED (A) NOT DETECTED Final    Comment: CRITICAL RESULT CALLED TO, READ BACK BY AND VERIFIED WITH: PHARMD TONY  RUDISILL ON 01/10/22 @ 2309 BY DRT    Staphylococcus epidermidis NOT DETECTED NOT DETECTED Final   Staphylococcus lugdunensis NOT DETECTED NOT DETECTED Final   Streptococcus species NOT DETECTED NOT DETECTED Final   Streptococcus agalactiae NOT DETECTED NOT DETECTED Final   Streptococcus pneumoniae NOT DETECTED NOT DETECTED Final   Streptococcus pyogenes NOT DETECTED NOT DETECTED Final   A.calcoaceticus-baumannii NOT DETECTED NOT DETECTED Final    Bacteroides fragilis NOT DETECTED NOT DETECTED Final   Enterobacterales NOT DETECTED NOT DETECTED Final  Enterobacter cloacae complex NOT DETECTED NOT DETECTED Final   Escherichia coli NOT DETECTED NOT DETECTED Final   Klebsiella aerogenes NOT DETECTED NOT DETECTED Final   Klebsiella oxytoca NOT DETECTED NOT DETECTED Final   Klebsiella pneumoniae NOT DETECTED NOT DETECTED Final   Proteus species NOT DETECTED NOT DETECTED Final   Salmonella species NOT DETECTED NOT DETECTED Final   Serratia marcescens NOT DETECTED NOT DETECTED Final   Haemophilus influenzae NOT DETECTED NOT DETECTED Final   Neisseria meningitidis NOT DETECTED NOT DETECTED Final   Pseudomonas aeruginosa NOT DETECTED NOT DETECTED Final   Stenotrophomonas maltophilia NOT DETECTED NOT DETECTED Final   Candida albicans NOT DETECTED NOT DETECTED Final   Candida auris NOT DETECTED NOT DETECTED Final   Candida glabrata NOT DETECTED NOT DETECTED Final   Candida krusei NOT DETECTED NOT DETECTED Final   Candida parapsilosis NOT DETECTED NOT DETECTED Final   Candida tropicalis NOT DETECTED NOT DETECTED Final   Cryptococcus neoformans/gattii NOT DETECTED NOT DETECTED Final   Meth resistant mecA/C and MREJ NOT DETECTED NOT DETECTED Final    Comment: Performed at [facility], [address], [place], [place] [address]  Blood culture (routine x 2)     Status: Abnormal   Collection Time: 01/10/22  5:24 AM   Specimen: BLOOD  Result Value Ref Range Status   Specimen Description   Final    BLOOD RIGHT ANTECUBITAL Performed at [facility], [address] [place], [place] [address]    Special Requests   Final    BOTTLES DRAWN AEROBIC AND ANAEROBIC Blood Culture adequate volume Performed at [facility], [address], [place], [place] [address]    Culture  Setup Time   Final    GRAM POSITIVE COCCI ANAEROBIC BOTTLE ONLY CRITICAL VALUE NOTED.  VALUE IS CONSISTENT WITH PREVIOUSLY REPORTED  AND CALLED VALUE.    Culture (A)  Final    STAPHYLOCOCCUS AUREUS SUSCEPTIBILITIES PERFORMED ON PREVIOUS CULTURE WITHIN THE LAST 5 DAYS. Performed at [facility], [address], [place], [place] [address]    Report Status 01/15/2022 FINAL  Final  Culture, blood (Routine X 2) w Reflex to ID Panel     Status: None   Collection Time: 01/12/22  5:55 AM   Specimen: BLOOD LEFT HAND  Result Value Ref Range Status   Specimen Description BLOOD LEFT HAND  Final   Special Requests   Final    BOTTLES DRAWN AEROBIC AND ANAEROBIC Blood Culture adequate volume   Culture   Final    NO GROWTH 5 DAYS Performed at [facility], [address], [place], [place] [address]    Report Status 01/17/2022 FINAL  Final  Culture, blood (Routine X 2) w Reflex to ID Panel     Status: None   Collection Time: 01/12/22  5:55 AM   Specimen: BLOOD RIGHT HAND  Result Value Ref Range Status   Specimen Description BLOOD RIGHT HAND  Final   Special Requests   Final    BOTTLES DRAWN AEROBIC AND ANAEROBIC Blood Culture adequate volume   Culture   Final    NO GROWTH 5 DAYS Performed at [facility], [address] [place], [place] [address]    Report Status 01/17/2022 FINAL  Final     Antimicrobials: Anti-infectives (From admission, onward)    Start     Dose/Rate Route Frequency Ordered Stop   01/11/22 0930  ceFAZolin (ANCEF) IVPB 2g/100 mL premix  2 g 200 mL/hr over 30 Minutes Intravenous Every 8 hours 01/11/22 0831     01/10/22 1345  ceFEPIme (MAXIPIME) 2 g in sodium chloride 0.9 % 100 mL IVPB        2 g 200 mL/hr over 30 Minutes Intravenous  Once 01/10/22 1330 01/10/22 1423   01/10/22 1345  vancomycin (VANCOCIN) IVPB 1000 mg/200 mL premix        1,000 mg 200 mL/hr over 60 Minutes Intravenous  Once 01/10/22 1343 01/10/22 1601   01/10/22 1145  ceFEPIme (MAXIPIME) 2 g in sodium chloride 0.9 % 100 mL IVPB  Status:  Discontinued        2 g 200 mL/hr over 30 Minutes Intravenous  Once  01/10/22 1143 01/10/22 1146      Culture/Microbiology    Component Value Date/Time   SDES BLOOD LEFT HAND 01/12/2022 0555   SDES BLOOD RIGHT HAND 01/12/2022 0555   SPECREQUEST  01/12/2022 0555    BOTTLES DRAWN AEROBIC AND ANAEROBIC Blood Culture adequate volume   SPECREQUEST  01/12/2022 0555    BOTTLES DRAWN AEROBIC AND ANAEROBIC Blood Culture adequate volume   CULT  01/12/2022 0555    NO GROWTH 5 DAYS Performed at [facility], [address], [place], [place] [address]    CULT  01/12/2022 0555    NO GROWTH 5 DAYS Performed at [facility], [address], [place], [place] [address]    REPTSTATUS 01/17/2022 FINAL 01/12/2022 0555   REPTSTATUS 01/17/2022 FINAL 01/12/2022 0555    Other culture-see note  Radiology Studies: No results found.   LOS: 7 days   Lanae Boast, MD [facility]  01/17/2022, 10:33 AM

## 2022-01-17 NOTE — Progress Notes (Signed)
[facility]  Date of Admission:  01/10/2022     Total days of antibiotics 8         ASSESSMENT:  Krystal Valdez blood cultures from 01/12/22 remain without growth to date. Continues to feel better. Discussed ideal plan of care to include at least 2 weeks of IV antibiotics from the date her cultures clear and then can complete remaining treatment with oral antibiotics. She is agreeable to this. Requested to increase Seroquel to 75 mg and start on Suboxone. Will discuss these requests with primary team. Continue current dose of Cefazolin. Remaining medical and supportive care per primary team.   PLAN:  Continue current dose of Cefazolin.  Monitor cultures for clearance of bacteremia.  Substance use treatment per primary team. Remaining medical and supportive care per primary team.   Principal Problem:   Septic arthritis of lumbar spine (HCC) Active Problems:   Drug abuse and dependence (HCC)   Hematuria   Staphylococcus aureus bacteremia   UTI (urinary tract infection)   Septic arthritis of vertebra (HCC)   Hypokalemia   Microcytic anemia   Hepatitis C virus infection    enoxaparin (LOVENOX) injection  40 mg Subcutaneous Q24H   naproxen  500 mg Oral BID WC   nicotine  21 mg Transdermal Daily   QUEtiapine  50 mg Oral QHS   senna-docusate  1 tablet Oral BID   sodium chloride flush  3 mL Intravenous Q12H    SUBJECTIVE:  Afebrile overnight with no acute events. Requesting seroquel to be increased to 75 mg and also wishing to start on Suboxone.   No Known Allergies   Review of Systems: Review of Systems  Constitutional:  Negative for chills, fever and weight loss.  Respiratory:  Negative for cough, shortness of breath and wheezing.   Cardiovascular:  Negative for chest pain and leg swelling.  Gastrointestinal:  Negative for abdominal pain, constipation, diarrhea, nausea and vomiting.  Skin:  Negative for rash.     OBJECTIVE: Vitals:    01/16/22 0807 01/16/22 2109 01/17/22 0324 01/17/22 0744  BP: 136/82 128/69 134/78 (!) 130/91  Pulse: 99 86 93 89  Resp: 16 14 16 17   Temp: 98.1 F (36.7 C) 98.4 F (36.9 C) 97.9 F (36.6 C) 98.3 F (36.8 C)  TempSrc: Oral Oral Oral Oral  SpO2: 100% 100% 100% 100%  Weight:      Height:       Body mass index is 22.71 kg/m.  Physical Exam Constitutional:      General: She is not in acute distress.    Appearance: She is well-developed.  Cardiovascular:     Rate and Rhythm: Normal rate and regular rhythm.     Heart sounds: Normal heart sounds.  Pulmonary:     Effort: Pulmonary effort is normal.     Breath sounds: Normal breath sounds.  Skin:    General: Skin is warm and dry.  Neurological:     Mental Status: She is alert and oriented to person, place, and time.  Psychiatric:        Mood and Affect: Mood is anxious.    Lab Results Lab Results  Component Value Date   WBC 6.2 01/14/2022   HGB 10.2 (L) 01/14/2022   HCT 30.9 (L) 01/14/2022   MCV 78.2 (L) 01/14/2022   PLT 388 01/14/2022    Lab Results  Component Value Date   CREATININE 0.56 01/14/2022   BUN 9 01/14/2022   NA 138 01/14/2022  K 3.8 01/14/2022   CL 103 01/14/2022   CO2 23 01/14/2022    Lab Results  Component Value Date   ALT 13 01/10/2022   AST 14 (L) 01/10/2022   ALKPHOS 71 01/10/2022   BILITOT 0.5 01/10/2022     Microbiology: Recent Results (from the past 240 hour(s))  Blood culture (routine x 2)     Status: Abnormal   Collection Time: 01/10/22  5:12 AM   Specimen: BLOOD  Result Value Ref Range Status   Specimen Description   Final    BLOOD LEFT ANTECUBITAL Performed at [facility], [address], [place], [place] [address]    Special Requests   Final    BOTTLES DRAWN AEROBIC AND ANAEROBIC Blood Culture results may not be optimal due to an inadequate volume of blood received in culture bottles Performed at [facility], [address],  [place], [place] [address]    Culture  Setup Time   Final    GRAM POSITIVE COCCI AEROBIC BOTTLE ONLY CRITICAL RESULT CALLED TO, READ BACK BY AND VERIFIED WITH: PHARMD TONY  RUDISILL ON 01/10/22 @ 2309 BY DRT Performed at [facility], [address] [place], [place] [address]    Culture STAPHYLOCOCCUS AUREUS (A)  Final   Report Status 01/12/2022 FINAL  Final   Organism ID, Bacteria STAPHYLOCOCCUS AUREUS  Final      Susceptibility   Staphylococcus aureus - MIC*    CIPROFLOXACIN <=0.5 SENSITIVE Sensitive     ERYTHROMYCIN <=0.25 SENSITIVE Sensitive     GENTAMICIN <=0.5 SENSITIVE Sensitive     OXACILLIN 0.5 SENSITIVE Sensitive     TETRACYCLINE <=1 SENSITIVE Sensitive     VANCOMYCIN 1 SENSITIVE Sensitive     TRIMETH/SULFA <=10 SENSITIVE Sensitive     CLINDAMYCIN <=0.25 SENSITIVE Sensitive     RIFAMPIN <=0.5 SENSITIVE Sensitive     Inducible Clindamycin NEGATIVE Sensitive     * STAPHYLOCOCCUS AUREUS  Blood Culture ID Panel (Reflexed)     Status: Abnormal   Collection Time: 01/10/22  5:12 AM  Result Value Ref Range Status   Enterococcus faecalis NOT DETECTED NOT DETECTED Final   Enterococcus Faecium NOT DETECTED NOT DETECTED Final   Listeria monocytogenes NOT DETECTED NOT DETECTED Final   Staphylococcus species DETECTED (A) NOT DETECTED Final    Comment: CRITICAL RESULT CALLED TO, READ BACK BY AND VERIFIED WITH: PHARMD TONY  RUDISILL ON 01/10/22 @ 2309 BY DRT    Staphylococcus aureus (BCID) DETECTED (A) NOT DETECTED Final    Comment: CRITICAL RESULT CALLED TO, READ BACK BY AND VERIFIED WITH: PHARMD TONY  RUDISILL ON 01/10/22 @ 2309 BY DRT    Staphylococcus epidermidis NOT DETECTED NOT DETECTED Final   Staphylococcus lugdunensis NOT DETECTED NOT DETECTED Final   Streptococcus species NOT DETECTED NOT DETECTED Final   Streptococcus agalactiae NOT DETECTED NOT DETECTED Final   Streptococcus pneumoniae NOT DETECTED NOT DETECTED Final   Streptococcus pyogenes NOT DETECTED NOT DETECTED  Final   A.calcoaceticus-baumannii NOT DETECTED NOT DETECTED Final   Bacteroides fragilis NOT DETECTED NOT DETECTED Final   Enterobacterales NOT DETECTED NOT DETECTED Final   Enterobacter cloacae complex NOT DETECTED NOT DETECTED Final   Escherichia coli NOT DETECTED NOT DETECTED Final   Klebsiella aerogenes NOT DETECTED NOT DETECTED Final   Klebsiella oxytoca NOT DETECTED NOT DETECTED Final   Klebsiella pneumoniae NOT DETECTED NOT DETECTED Final   Proteus species NOT DETECTED NOT DETECTED Final   Salmonella species NOT DETECTED NOT DETECTED  Final   Serratia marcescens NOT DETECTED NOT DETECTED Final   Haemophilus influenzae NOT DETECTED NOT DETECTED Final   Neisseria meningitidis NOT DETECTED NOT DETECTED Final   Pseudomonas aeruginosa NOT DETECTED NOT DETECTED Final   Stenotrophomonas maltophilia NOT DETECTED NOT DETECTED Final   Candida albicans NOT DETECTED NOT DETECTED Final   Candida auris NOT DETECTED NOT DETECTED Final   Candida glabrata NOT DETECTED NOT DETECTED Final   Candida krusei NOT DETECTED NOT DETECTED Final   Candida parapsilosis NOT DETECTED NOT DETECTED Final   Candida tropicalis NOT DETECTED NOT DETECTED Final   Cryptococcus neoformans/gattii NOT DETECTED NOT DETECTED Final   Meth resistant mecA/C and MREJ NOT DETECTED NOT DETECTED Final    Comment: Performed at [facility], [address] [place], [place] [address]  Blood culture (routine x 2)     Status: Abnormal   Collection Time: 01/10/22  5:24 AM   Specimen: BLOOD  Result Value Ref Range Status   Specimen Description   Final    BLOOD RIGHT ANTECUBITAL Performed at [facility], [address] [place], [place] [address]    Special Requests   Final    BOTTLES DRAWN AEROBIC AND ANAEROBIC Blood Culture adequate volume Performed at [facility], [address] [place], [place] [address]    Culture  Setup Time   Final    GRAM POSITIVE COCCI ANAEROBIC BOTTLE  ONLY CRITICAL VALUE NOTED.  VALUE IS CONSISTENT WITH PREVIOUSLY REPORTED AND CALLED VALUE.    Culture (A)  Final    STAPHYLOCOCCUS AUREUS SUSCEPTIBILITIES PERFORMED ON PREVIOUS CULTURE WITHIN THE LAST 5 DAYS. Performed at [facility], [address] [place], [place] [address]    Report Status 01/15/2022 FINAL  Final  Culture, blood (Routine X 2) w Reflex to ID Panel     Status: None   Collection Time: 01/12/22  5:55 AM   Specimen: BLOOD LEFT HAND  Result Value Ref Range Status   Specimen Description BLOOD LEFT HAND  Final   Special Requests   Final    BOTTLES DRAWN AEROBIC AND ANAEROBIC Blood Culture adequate volume   Culture   Final    NO GROWTH 5 DAYS Performed at [facility], [address] [place], [place] [address]    Report Status 01/17/2022 FINAL  Final  Culture, blood (Routine X 2) w Reflex to ID Panel     Status: None   Collection Time: 01/12/22  5:55 AM   Specimen: BLOOD RIGHT HAND  Result Value Ref Range Status   Specimen Description BLOOD RIGHT HAND  Final   Special Requests   Final    BOTTLES DRAWN AEROBIC AND ANAEROBIC Blood Culture adequate volume   Culture   Final    NO GROWTH 5 DAYS Performed at [facility], [address] [place], [place] [address]    Report Status 01/17/2022 FINAL  Final     Marcos EkeGreg Rambo Sarafian, NP [facility]  01/17/2022  12:56 PM

## 2022-01-18 DIAGNOSIS — M4656 Other infective spondylopathies, lumbar region: Secondary | ICD-10-CM | POA: Diagnosis not present

## 2022-01-18 MED ORDER — LOPERAMIDE HCL 2 MG PO CAPS
2.0000 mg | ORAL_CAPSULE | ORAL | Status: DC | PRN
  Administered 2022-01-18 – 2022-01-24 (×5): 2 mg via ORAL
  Filled 2022-01-18 (×5): qty 1

## 2022-01-18 MED ORDER — TRAZODONE HCL 50 MG PO TABS
50.0000 mg | ORAL_TABLET | Freq: Every evening | ORAL | Status: DC | PRN
  Administered 2022-01-18 – 2022-01-23 (×6): 50 mg via ORAL
  Filled 2022-01-18 (×6): qty 1

## 2022-01-18 NOTE — Progress Notes (Signed)
PROGRESS NOTE REBECKA OELKERS  ZOX:[id number] DOB: August 23, 1995 DOA: 01/10/2022 PCP: Pcp, No   Brief Narrative/Hospital Course: 26 year old female history of IV drug use, mood disorder presented with 3-day history of lower back pain, work-up showed leukocytosis and elevated sed rate MRI T and L-spine concerning for septic arthritis at L2 -L3 and  10x15 mm fluid collection. Neurosurg rec IR and ID consult. IR consulted for aspiration and too small for aspiration.  ID consulted and helping with antibiotic management.  2D echo ordered no acute finding.  Patient subsequently underwent TEE EF 60 to 65%, no LA/LAA thrombus or masses and no endocarditis noted trivial MR and trivial TR.  Patient remains hospitalized for ongoing IV antibiotics. Found doing vaping in room  6/4- placed on tele sitter.   Subjective: Seen examined this am No new complaints Wants something more for sleep.  Assessment and Plan: Principal Problem:   Septic arthritis of lumbar spine (HCC) Active Problems:   Staphylococcus aureus bacteremia   Drug abuse and dependence (HCC)   Hematuria   UTI (urinary tract infection)   Septic arthritis of vertebra (HCC)   Hypokalemia   Microcytic anemia   Hepatitis C virus infection   Septic arthritis of lumbar spine L2 -L3 and probably 10x15 mm fluid collection and myositis: MSSA bacteremia: Repeat blood culture 5/31 NGTD,TEE- no IE.  Cont iv ancef as per ID x 14 days- today Day #7.transition to p.o. antibiotics after 14 days.  Unsafe to go home with PICC line 2/2 IVDA.  IVDA: Patient admits to taking heroin and methamphetamine.  Cessation has been discussed.  TOC consulted.  HIV negative RPR negative HCV antibody positive.Minimize opiates, continue Atarax IV, Toradol, Robaxin. Cont Oral oxycodone for pain control. She is agreeable to stay and complete iv antibiotics.  May be interested in Suboxone therapy reports ongoing to try healing and [place] which she is going to call tomorrow to  see if she can get an appointment around 14 June and she could go there upon d/c-once we confirm appointment we can start Suboxone here  and dc on few days supply until she gets seen in clinic. TOC consulted as well.  Hepatitis C HCVRNA detectable at 30 copies. HCV clearing on her own will need HCVRNA in 3 months.  Hep B surface antigen negative hep B antibody less than 0.1 core hep b total antibody nonreactive,hep A antibody reactive  UTI continue antibiotics Hypokalemia: Resolved Microcytic anemia stable.Monitor  I had requested patient to stay in the hospital to complete IV antibiotics until she is cleared for discharge, advised not to leave AGAINST MEDICAL ADVICE as it may lead to further worsening of infection that could be disabling,  leading to paraplegia or even death.  Patient verbalized understanding  DVT prophylaxis: enoxaparin (LOVENOX) injection 40 mg Start: 01/10/22 2030 Code Status:   Code Status: Full Code Family Communication: plan of care discussed with patient at bedside. Patient status is: Inpatient because of further work-up and to rule out endocarditis Level of care: Med-Surg   Dispo:The patient is from:Home. Anticipated disposition:Plan for discharge home after completing 14 days of IV antibiotics after which weekly infusion versus oral antibiotics.   Mobility Assessment (last 72 hours)     Mobility Assessment     Row Name 01/18/22 [id number] 01/17/22 2117 01/17/22 2100 01/17/22 0849 01/16/22 2000   Does patient have an order for bedrest or is patient medically unstable No - Continue assessment No - Continue assessment No - Continue assessment No - Continue assessment  No - Continue assessment   What is the highest level of mobility based on the progressive mobility assessment? Level 6 (Walks independently in room and hall) - Balance while walking in room without assist - Complete Level 6 (Walks independently in room and hall) - Balance while walking in room without assist -  Complete Level 6 (Walks independently in room and hall) - Balance while walking in room without assist - Complete Level 6 (Walks independently in room and hall) - Balance while walking in room without assist - Complete --   Is the above level different from baseline mobility prior to current illness? -- Yes - Recommend PT order Yes - Recommend PT order Yes - Recommend PT order --    Row Name 01/16/22 0920           Does patient have an order for bedrest or is patient medically unstable No - Continue assessment       Is the above level different from baseline mobility prior to current illness? Yes - Recommend PT order                 Objective: Vitals last 24 hrs: Vitals:   01/17/22 1448 01/17/22 1927 01/18/22 0502 01/18/22 1315  BP: (!) 102/49 117/73 132/80 131/82  Pulse: 99 91 71 88  Resp: Temp: 98.4 F (36.9 C) 97.7 F (36.5 C) 97.6 F (36.4 C) 98.6 F (37 C)  TempSrc: Oral Oral Oral Oral  SpO2: 100% 100% 100% 100%  Weight:      Height:       Weight change:   Physical Examination: General exam: AAox3,older than stated age, weak appearing. HEENT:Oral mucosa moist, Ear/Nose WNL grossly, dentition normal. Respiratory system: bilaterally clear,no use of accessory muscle Cardiovascular system: S1 & S2 +, No JVD,. Gastrointestinal system: Abdomen soft,NT,ND, BS+ Nervous System:Alert, awake, moving extremities and grossly nonfocal Extremities: edema neg,distal peripheral pulses palpable.  Skin: No rashes,no icterus. MSK: Normal muscle bulk,tone, power  Medications reviewed:  Scheduled Meds:  enoxaparin (LOVENOX) injection  40 mg Subcutaneous Q24H   naproxen  500 mg Oral BID WC   nicotine  21 mg Transdermal Daily   QUEtiapine  75 mg Oral QHS   senna-docusate  1 tablet Oral BID   sodium chloride flush  3 mL Intravenous Q12H   Continuous Infusions:  sodium chloride      ceFAZolin (ANCEF) IV 2 g (01/18/22 0806)   methocarbamol (ROBAXIN) IV 500 mg (01/18/22  1137)    Diet Order             Diet regular Room service appropriate? Yes; Fluid consistency: Thin  Diet effective now                  No intake or output data in the 24 hours ending 01/18/22 1323  Net IO Since Admission: 2,775.98 mL [01/18/22 1323]  Wt Readings from Last 3 Encounters:  01/14/22 65.8 kg  08/04/16 65.8 kg  06/29/16 68 kg   Unresulted Labs (From admission, onward)    None     Data Reviewed: I have personally reviewed following labs and imaging studies CBC: Recent Labs  Lab 01/12/22 0555 01/14/22 0943  WBC 4.4 6.2  NEUTROABS 3.0  --   HGB 10.0* 10.2*  HCT 30.1* 30.9*  MCV 78.0* 78.2*  PLT 285 388    Basic Metabolic Panel: Recent Labs  Lab 01/12/22 0555 01/14/22 0242  NA 134* 138  K 3.1* 3.8  CL 100 103  CO2 23 23  GLUCOSE 101* 104*  BUN 6 9  CREATININE 0.58 0.56  CALCIUM 8.8* 8.8*    GFR: Estimated Creatinine Clearance: 102.7 mL/min (by C-G formula based on SCr of 0.56 mg/dL). Liver Function Tests: No results for input(s): AST, ALT, ALKPHOS, BILITOT, PROT, ALBUMIN in the last 168 hours.  No results for input(s): LIPASE, AMYLASE in the last 168 hours. No results for input(s): AMMONIA in the last 168 hours. Coagulation Profile: No results for input(s): INR, PROTIME in the last 168 hours. BNP (last 3 results) No results for input(s): PROBNP in the last 8760 hours. HbA1C: No results for input(s): HGBA1C in the last 72 hours. CBG: No results for input(s): GLUCAP in the last 168 hours. Lipid Profile: No results for input(s): CHOL, HDL, LDLCALC, TRIG, CHOLHDL, LDLDIRECT in the last 72 hours. Thyroid Function Tests: No results for input(s): TSH, T4TOTAL, FREET4, T3FREE, THYROIDAB in the last 72 hours. Sepsis Labs: No results for input(s): PROCALCITON, LATICACIDVEN in the last 168 hours.   Recent Results (from the past 240 hour(s))  Blood culture (routine x 2)     Status: Abnormal   Collection Time: 01/10/22  5:12 AM   Specimen:  BLOOD  Result Value Ref Range Status   Specimen Description   Final    BLOOD LEFT ANTECUBITAL Performed at [facility], [address] [place], [place] [address]    Special Requests   Final    BOTTLES DRAWN AEROBIC AND ANAEROBIC Blood Culture results may not be optimal due to an inadequate volume of blood received in culture bottles Performed at [facility], [address], [place], [place] [address]    Culture  Setup Time   Final    GRAM POSITIVE COCCI AEROBIC BOTTLE ONLY CRITICAL RESULT CALLED TO, READ BACK BY AND VERIFIED WITH: PHARMD TONY  RUDISILL ON 01/10/22 @ 2309 BY DRT Performed at [facility], [address], [place], [place] [address]    Culture STAPHYLOCOCCUS AUREUS (A)  Final   Report Status 01/12/2022 FINAL  Final   Organism ID, Bacteria STAPHYLOCOCCUS AUREUS  Final      Susceptibility   Staphylococcus aureus - MIC*    CIPROFLOXACIN <=0.5 SENSITIVE Sensitive     ERYTHROMYCIN <=0.25 SENSITIVE Sensitive     GENTAMICIN <=0.5 SENSITIVE Sensitive     OXACILLIN 0.5 SENSITIVE Sensitive     TETRACYCLINE <=1 SENSITIVE Sensitive     VANCOMYCIN 1 SENSITIVE Sensitive     TRIMETH/SULFA <=10 SENSITIVE Sensitive     CLINDAMYCIN <=0.25 SENSITIVE Sensitive     RIFAMPIN <=0.5 SENSITIVE Sensitive     Inducible Clindamycin NEGATIVE Sensitive     * STAPHYLOCOCCUS AUREUS  Blood Culture ID Panel (Reflexed)     Status: Abnormal   Collection Time: 01/10/22  5:12 AM  Result Value Ref Range Status   Enterococcus faecalis NOT DETECTED NOT DETECTED Final   Enterococcus Faecium NOT DETECTED NOT DETECTED Final   Listeria monocytogenes NOT DETECTED NOT DETECTED Final   Staphylococcus species DETECTED (A) NOT DETECTED Final    Comment: CRITICAL RESULT CALLED TO, READ BACK BY AND VERIFIED WITH: PHARMD TONY  RUDISILL ON 01/10/22 @ 2309 BY DRT    Staphylococcus aureus (BCID) DETECTED (A) NOT DETECTED Final    Comment: CRITICAL RESULT CALLED TO, READ  BACK BY AND VERIFIED WITH: PHARMD TONY  RUDISILL ON 01/10/22 @ 2309 BY DRT    Staphylococcus epidermidis NOT DETECTED NOT DETECTED Final  Staphylococcus lugdunensis NOT DETECTED NOT DETECTED Final   Streptococcus species NOT DETECTED NOT DETECTED Final   Streptococcus agalactiae NOT DETECTED NOT DETECTED Final   Streptococcus pneumoniae NOT DETECTED NOT DETECTED Final   Streptococcus pyogenes NOT DETECTED NOT DETECTED Final   A.calcoaceticus-baumannii NOT DETECTED NOT DETECTED Final   Bacteroides fragilis NOT DETECTED NOT DETECTED Final   Enterobacterales NOT DETECTED NOT DETECTED Final   Enterobacter cloacae complex NOT DETECTED NOT DETECTED Final   Escherichia coli NOT DETECTED NOT DETECTED Final   Klebsiella aerogenes NOT DETECTED NOT DETECTED Final   Klebsiella oxytoca NOT DETECTED NOT DETECTED Final   Klebsiella pneumoniae NOT DETECTED NOT DETECTED Final   Proteus species NOT DETECTED NOT DETECTED Final   Salmonella species NOT DETECTED NOT DETECTED Final   Serratia marcescens NOT DETECTED NOT DETECTED Final   Haemophilus influenzae NOT DETECTED NOT DETECTED Final   Neisseria meningitidis NOT DETECTED NOT DETECTED Final   Pseudomonas aeruginosa NOT DETECTED NOT DETECTED Final   Stenotrophomonas maltophilia NOT DETECTED NOT DETECTED Final   Candida albicans NOT DETECTED NOT DETECTED Final   Candida auris NOT DETECTED NOT DETECTED Final   Candida glabrata NOT DETECTED NOT DETECTED Final   Candida krusei NOT DETECTED NOT DETECTED Final   Candida parapsilosis NOT DETECTED NOT DETECTED Final   Candida tropicalis NOT DETECTED NOT DETECTED Final   Cryptococcus neoformans/gattii NOT DETECTED NOT DETECTED Final   Meth resistant mecA/C and MREJ NOT DETECTED NOT DETECTED Final    Comment: Performed at [facility], [address] [place], [place] [address]  Blood culture (routine x 2)     Status: Abnormal   Collection Time: 01/10/22  5:24 AM   Specimen: BLOOD  Result Value Ref  Range Status   Specimen Description   Final    BLOOD RIGHT ANTECUBITAL Performed at [facility], [address] [place], [place] [address]    Special Requests   Final    BOTTLES DRAWN AEROBIC AND ANAEROBIC Blood Culture adequate volume Performed at [facility], [address] [place], [place] [address]    Culture  Setup Time   Final    GRAM POSITIVE COCCI ANAEROBIC BOTTLE ONLY CRITICAL VALUE NOTED.  VALUE IS CONSISTENT WITH PREVIOUSLY REPORTED AND CALLED VALUE.    Culture (A)  Final    STAPHYLOCOCCUS AUREUS SUSCEPTIBILITIES PERFORMED ON PREVIOUS CULTURE WITHIN THE LAST 5 DAYS. Performed at [facility], [address] [place], [place] [address]    Report Status 01/15/2022 FINAL  Final  Culture, blood (Routine X 2) w Reflex to ID Panel     Status: None   Collection Time: 01/12/22  5:55 AM   Specimen: BLOOD LEFT HAND  Result Value Ref Range Status   Specimen Description BLOOD LEFT HAND  Final   Special Requests   Final    BOTTLES DRAWN AEROBIC AND ANAEROBIC Blood Culture adequate volume   Culture   Final    NO GROWTH 5 DAYS Performed at [facility], [address] [place], [place] [address]    Report Status 01/17/2022 FINAL  Final  Culture, blood (Routine X 2) w Reflex to ID Panel     Status: None   Collection Time: 01/12/22  5:55 AM   Specimen: BLOOD RIGHT HAND  Result Value Ref Range Status   Specimen Description BLOOD RIGHT HAND  Final   Special Requests   Final    BOTTLES DRAWN AEROBIC AND ANAEROBIC Blood Culture adequate volume  Culture   Final    NO GROWTH 5 DAYS Performed at [facility], [address] [place], [place] [address]    Report Status 01/17/2022 FINAL  Final     Antimicrobials: Anti-infectives (From admission, onward)    Start     Dose/Rate Route Frequency Ordered Stop   01/11/22 0930  ceFAZolin (ANCEF) IVPB 2g/100 mL premix        2 g 200 mL/hr over 30 Minutes Intravenous Every 8  hours 01/11/22 0831     01/10/22 1345  ceFEPIme (MAXIPIME) 2 g in sodium chloride 0.9 % 100 mL IVPB        2 g 200 mL/hr over 30 Minutes Intravenous  Once 01/10/22 1330 01/10/22 1423   01/10/22 1345  vancomycin (VANCOCIN) IVPB 1000 mg/200 mL premix        1,000 mg 200 mL/hr over 60 Minutes Intravenous  Once 01/10/22 1343 01/10/22 1601   01/10/22 1145  ceFEPIme (MAXIPIME) 2 g in sodium chloride 0.9 % 100 mL IVPB  Status:  Discontinued        2 g 200 mL/hr over 30 Minutes Intravenous  Once 01/10/22 1143 01/10/22 1146      Culture/Microbiology    Component Value Date/Time   SDES BLOOD LEFT HAND 01/12/2022 0555   SDES BLOOD RIGHT HAND 01/12/2022 0555   SPECREQUEST  01/12/2022 0555    BOTTLES DRAWN AEROBIC AND ANAEROBIC Blood Culture adequate volume   SPECREQUEST  01/12/2022 0555    BOTTLES DRAWN AEROBIC AND ANAEROBIC Blood Culture adequate volume   CULT  01/12/2022 0555    NO GROWTH 5 DAYS Performed at [facility], [address] [place], [place] [address]    CULT  01/12/2022 0555    NO GROWTH 5 DAYS Performed at [facility], [address], [place], [place] [address]    REPTSTATUS 01/17/2022 FINAL 01/12/2022 0555   REPTSTATUS 01/17/2022 FINAL 01/12/2022 0555    Other culture-see note  Radiology Studies: No results found.   LOS: 8 days   Lanae Boast, MD [facility]  01/18/2022, 1:23 PM

## 2022-01-19 DIAGNOSIS — M4656 Other infective spondylopathies, lumbar region: Secondary | ICD-10-CM | POA: Diagnosis not present

## 2022-01-19 DIAGNOSIS — R7881 Bacteremia: Secondary | ICD-10-CM | POA: Diagnosis not present

## 2022-01-19 DIAGNOSIS — B957 Other staphylococcus as the cause of diseases classified elsewhere: Secondary | ICD-10-CM | POA: Diagnosis not present

## 2022-01-19 NOTE — Progress Notes (Signed)
PROGRESS NOTE    Krystal Valdez  ZLD:[id number] DOB: 21-May-1996 DOA: 01/10/2022 PCP: Merryl Hacker, No    Brief Narrative:   Krystal Valdez is a 26 y.o. female with past medical history significant for current IV drug use, mood disorder who presented to [facility] ED on 5/29 with low back pain.  Patient also with subjective fever/chills, no pain relief at home using Tylenol, Benadryl, heroin and heat therapy.  Also admitted to some nausea.  Pain is radiating from the left lower back to her abdomen.  In the ED, WBC 10.7, ESR 75, CRP 13.2, lactic acid 1.  MR T/L-spine with likely septic arthritis L2-3 with 10 x 15 mm fluid collection.  Patient was started vancomycin and cefepime.  EDP consulted with neurosurgery and did not believe there was any acute need for surgical intervention; and recommended ID consult and possible IR for aspiration.  Hospital service consulted for further evaluation and management of arthritis.   Assessment & Plan:   Septic arthritis L-spine L2-L3 with fluid collection MSSA bacteremia Patient presenting with low back pain with elevated ESR/CRP with septic arthritis at L2-3 with 10 x 15 mm fluid collection on MR T/L-spine.  EDP discussed with neurosurgery, did not believe there is any acute need for surgical intervention.  IR was consulted, area of fluid collection too small to obtain sample.  TEE negative for endocarditis. --Infectious disease following, appreciate assistance --Repeat blood cultures 5/31: No growth x5 days --Cefazolin 2 g IV q8h x 14 days (thru 6/12); will transition to p.o. cefadroxil to complete antibiotic course  IV drug abuse Patient admits to taking heroin and methamphetamine.  UDS positive for amphetamines, opiates, THC.  Discussed need for complete cessation.  Patient interested in Suboxone therapy after discharge and will be making an appointment with at an outpatient clinic to continue therapy on discharge.  Seen by TOC.  Hepatitis C HCVRNA detectable at  30 copies. HCV clearing on her own will need HCVRNA in 3 months.  Hep B surface antigen negative hep B antibody less than 0.1 core hep b total antibody nonreactive,hep A antibody reactive.  Outpatient follow-up with infectious disease  Hypokalemia: Resolved Repleted during hospitalization  Microcytic anemia Stable    DVT prophylaxis: enoxaparin (LOVENOX) injection 40 mg Start: 01/10/22 2030    Code Status: Full Code Family Communication: No family present at bedside this morning  Disposition Plan:  Level of care: Med-Surg Status is: Inpatient Remains inpatient appropriate because: Needs to complete IV antibiotics inpatient due to ongoing IV drug abuse anticipate discharge on 6/13    Consultants:  Infectious disease  Procedures:  None  Antimicrobials:  Vancomycin 5/29 - 5/29 Cefepime 5/29 - 5/29 Cefazolin 5/30>>   Subjective: Patient seen examined bedside, resting comfortably.  Requesting to be able to go outside.  Discussed with her only able to go outside if staff member able to assist.  Also states will try to make a outpatient appointment for the Suboxone clinic so she can continue to follow at time of discharge.  No other specific complaints or concerns at this time.  Denies headache, no visual changes, no chest pain, palpitations, no shortness of breath, no abdominal pain, no fever/chills/night sweats, no nausea/vomiting/diarrhea, no cough/congestion, no focal weakness, no fatigue, no paresthesias.  No acute events overnight per nursing staff.  Objective: Vitals:   01/18/22 1946 01/19/22 0413 01/19/22 0751 01/19/22 1602  BP: 124/76 127/88 (!) 143/98 138/81  Pulse: 76 80 86 90  Resp: 18 18 16  16  Temp: 98.3 F (36.8 C) 98.2 F (36.8 C) 97.7 F (36.5 C) 98.4 F (36.9 C)  TempSrc:   Oral Oral  SpO2: 100% 100% 100% 100%  Weight:      Height:        Intake/Output Summary (Last 24 hours) at 01/19/2022 1639 Last data filed at 01/18/2022 2030 Gross per 24 hour  Intake  240 ml  Output --  Net 240 ml   Filed Weights   01/14/22 1038  Weight: 65.8 kg    Examination:  Physical Exam: GEN: NAD, alert and oriented x 3, appears older than stated age HEENT: NCAT, PERRL, EOMI, sclera clear, MMM PULM: CTAB w/o wheezes/crackles, normal respiratory effort, on room air CV: RRR w/o M/G/R GI: abd soft, NTND, NABS, no R/G/M MSK: no peripheral edema, muscle strength globally intact 5/5 bilateral upper/lower extremities NEURO: CN II-XII intact, no focal deficits, sensation to light touch intact PSYCH: normal mood/affect Integumentary: dry/intact, no rashes or wounds    Data Reviewed: I have personally reviewed following labs and imaging studies  CBC: Recent Labs  Lab 01/14/22 0943  WBC 6.2  HGB 10.2*  HCT 30.9*  MCV 78.2*  PLT 416   Basic Metabolic Panel: Recent Labs  Lab 01/14/22 0242  NA 138  K 3.8  CL 103  CO2 23  GLUCOSE 104*  BUN 9  CREATININE 0.56  CALCIUM 8.8*   GFR: Estimated Creatinine Clearance: 102.7 mL/min (by C-G formula based on SCr of 0.56 mg/dL). Liver Function Tests: No results for input(s): AST, ALT, ALKPHOS, BILITOT, PROT, ALBUMIN in the last 168 hours. No results for input(s): LIPASE, AMYLASE in the last 168 hours. No results for input(s): AMMONIA in the last 168 hours. Coagulation Profile: No results for input(s): INR, PROTIME in the last 168 hours. Cardiac Enzymes: No results for input(s): CKTOTAL, CKMB, CKMBINDEX, TROPONINI in the last 168 hours. BNP (last 3 results) No results for input(s): PROBNP in the last 8760 hours. HbA1C: No results for input(s): HGBA1C in the last 72 hours. CBG: No results for input(s): GLUCAP in the last 168 hours. Lipid Profile: No results for input(s): CHOL, HDL, LDLCALC, TRIG, CHOLHDL, LDLDIRECT in the last 72 hours. Thyroid Function Tests: No results for input(s): TSH, T4TOTAL, FREET4, T3FREE, THYROIDAB in the last 72 hours. Anemia Panel: No results for input(s): VITAMINB12,  FOLATE, FERRITIN, TIBC, IRON, RETICCTPCT in the last 72 hours. Sepsis Labs: No results for input(s): PROCALCITON, LATICACIDVEN in the last 168 hours.  Recent Results (from the past 240 hour(s))  Blood culture (routine x 2)     Status: Abnormal   Collection Time: 01/10/22  5:12 AM   Specimen: BLOOD  Result Value Ref Range Status   Specimen Description   Final    BLOOD LEFT ANTECUBITAL Performed at [facility] [address], [place], [place] [address]    Special Requests   Final    BOTTLES DRAWN AEROBIC AND ANAEROBIC Blood Culture results may not be optimal due to an inadequate volume of blood received in culture bottles Performed at [facility] [address], [place], [place] [address]    Culture  Setup Time   Final    GRAM POSITIVE COCCI AEROBIC BOTTLE ONLY CRITICAL RESULT CALLED TO, READ BACK BY AND VERIFIED WITH: PHARMD TONY  RUDISILL ON 01/10/22 @ 3212 BY DRT Performed at [facility], [place] [address] [place], [place] [address]    Culture STAPHYLOCOCCUS AUREUS (A)  Final   Report Status 01/12/2022  FINAL  Final   Organism ID, Bacteria STAPHYLOCOCCUS AUREUS  Final      Susceptibility   Staphylococcus aureus - MIC*    CIPROFLOXACIN <=0.5 SENSITIVE Sensitive     ERYTHROMYCIN <=0.25 SENSITIVE Sensitive     GENTAMICIN <=0.5 SENSITIVE Sensitive     OXACILLIN 0.5 SENSITIVE Sensitive     TETRACYCLINE <=1 SENSITIVE Sensitive     VANCOMYCIN 1 SENSITIVE Sensitive     TRIMETH/SULFA <=10 SENSITIVE Sensitive     CLINDAMYCIN <=0.25 SENSITIVE Sensitive     RIFAMPIN <=0.5 SENSITIVE Sensitive     Inducible Clindamycin NEGATIVE Sensitive     * STAPHYLOCOCCUS AUREUS  Blood Culture ID Panel (Reflexed)     Status: Abnormal   Collection Time: 01/10/22  5:12 AM  Result Value Ref Range Status   Enterococcus faecalis NOT DETECTED NOT DETECTED Final   Enterococcus Faecium NOT DETECTED NOT DETECTED Final   Listeria monocytogenes NOT DETECTED NOT  DETECTED Final   Staphylococcus species DETECTED (A) NOT DETECTED Final    Comment: CRITICAL RESULT CALLED TO, READ BACK BY AND VERIFIED WITH: PHARMD TONY  RUDISILL ON 01/10/22 @ 2309 BY DRT    Staphylococcus aureus (BCID) DETECTED (A) NOT DETECTED Final    Comment: CRITICAL RESULT CALLED TO, READ BACK BY AND VERIFIED WITH: PHARMD TONY  RUDISILL ON 01/10/22 @ 2309 BY DRT    Staphylococcus epidermidis NOT DETECTED NOT DETECTED Final   Staphylococcus lugdunensis NOT DETECTED NOT DETECTED Final   Streptococcus species NOT DETECTED NOT DETECTED Final   Streptococcus agalactiae NOT DETECTED NOT DETECTED Final   Streptococcus pneumoniae NOT DETECTED NOT DETECTED Final   Streptococcus pyogenes NOT DETECTED NOT DETECTED Final   A.calcoaceticus-baumannii NOT DETECTED NOT DETECTED Final   Bacteroides fragilis NOT DETECTED NOT DETECTED Final   Enterobacterales NOT DETECTED NOT DETECTED Final   Enterobacter cloacae complex NOT DETECTED NOT DETECTED Final   Escherichia coli NOT DETECTED NOT DETECTED Final   Klebsiella aerogenes NOT DETECTED NOT DETECTED Final   Klebsiella oxytoca NOT DETECTED NOT DETECTED Final   Klebsiella pneumoniae NOT DETECTED NOT DETECTED Final   Proteus species NOT DETECTED NOT DETECTED Final   Salmonella species NOT DETECTED NOT DETECTED Final   Serratia marcescens NOT DETECTED NOT DETECTED Final   Haemophilus influenzae NOT DETECTED NOT DETECTED Final   Neisseria meningitidis NOT DETECTED NOT DETECTED Final   Pseudomonas aeruginosa NOT DETECTED NOT DETECTED Final   Stenotrophomonas maltophilia NOT DETECTED NOT DETECTED Final   Candida albicans NOT DETECTED NOT DETECTED Final   Candida auris NOT DETECTED NOT DETECTED Final   Candida glabrata NOT DETECTED NOT DETECTED Final   Candida krusei NOT DETECTED NOT DETECTED Final   Candida parapsilosis NOT DETECTED NOT DETECTED Final   Candida tropicalis NOT DETECTED NOT DETECTED Final   Cryptococcus neoformans/gattii NOT DETECTED  NOT DETECTED Final   Meth resistant mecA/C and MREJ NOT DETECTED NOT DETECTED Final    Comment: Performed at [facility], [address], [place], [place] [address]  Blood culture (routine x 2)     Status: Abnormal   Collection Time: 01/10/22  5:24 AM   Specimen: BLOOD  Result Value Ref Range Status   Specimen Description   Final    BLOOD RIGHT ANTECUBITAL Performed at [facility] [address] [place], [place] [address]    Special Requests   Final    BOTTLES DRAWN AEROBIC AND ANAEROBIC Blood Culture adequate volume Performed at [facility] [address]  [place], [place] [address]    Culture  Setup Time   Final    GRAM POSITIVE COCCI ANAEROBIC BOTTLE ONLY CRITICAL VALUE NOTED.  VALUE IS CONSISTENT WITH PREVIOUSLY REPORTED AND CALLED VALUE.    Culture (A)  Final    STAPHYLOCOCCUS AUREUS SUSCEPTIBILITIES PERFORMED ON PREVIOUS CULTURE WITHIN THE LAST 5 DAYS. Performed at [facility], [place] [address] [place], [place] [address]    Report Status 01/15/2022 FINAL  Final  Culture, blood (Routine X 2) w Reflex to ID Panel     Status: None   Collection Time: 01/12/22  5:55 AM   Specimen: BLOOD LEFT HAND  Result Value Ref Range Status   Specimen Description BLOOD LEFT HAND  Final   Special Requests   Final    BOTTLES DRAWN AEROBIC AND ANAEROBIC Blood Culture adequate volume   Culture   Final    NO GROWTH 5 DAYS Performed at [facility], [place] [address], [place], [place] [address]    Report Status 01/17/2022 FINAL  Final  Culture, blood (Routine X 2) w Reflex to ID Panel     Status: None   Collection Time: 01/12/22  5:55 AM   Specimen: BLOOD RIGHT HAND  Result Value Ref Range Status   Specimen Description BLOOD RIGHT HAND  Final   Special Requests   Final    BOTTLES DRAWN AEROBIC AND ANAEROBIC Blood Culture adequate volume   Culture   Final    NO GROWTH 5 DAYS Performed at [facility], [place] [address]  [place],  [address]    Report Status 01/17/2022 FINAL  Final         Radiology Studies: No results found.      Scheduled Meds:  enoxaparin (LOVENOX) injection  40 mg Subcutaneous Q24H   naproxen  500 mg Oral BID WC   nicotine  21 mg Transdermal Daily   QUEtiapine  75 mg Oral QHS   senna-docusate  1 tablet Oral BID   sodium chloride flush  3 mL Intravenous Q12H   Continuous Infusions:  sodium chloride      ceFAZolin (ANCEF) IV 2 g (01/19/22 1615)   methocarbamol (ROBAXIN) IV 500 mg (01/18/22 1137)     LOS: 9 days    Time spent: 41 minutes spent on chart review, discussion with nursing staff, consultants, updating family and interview/physical exam; more than 50% of that time was spent in counseling and/or coordination of care.    Mercie Balsley J [place] DO [facility] Available via Epic secure chat 7am-7pm After these hours, please refer to coverage provider listed on [email or web address] 01/19/2022, 4:39 PM

## 2022-01-19 NOTE — Plan of Care (Signed)

## 2022-01-19 NOTE — Progress Notes (Signed)
[facility]  Date of Admission:  01/10/2022     Total days of antibiotics 10         ASSESSMENT:  Krystal Valdez continues to progress well and is tolerating antibiotics with no adverse side effects. Will continue with Cefazolin through 6/12 and transition to Cefadroxil. Established with a suboxone clinic for post-discharge treatment of opioid use disorder. Continue current dose of Cefazolin. Remaining medical and supportive care per primary team.   PLAN:  Continue current dose of Cefazolin through 6/12.  Remaining medial and supportive care per primary team.   Principal Problem:   Septic arthritis of lumbar spine ([place]) Active Problems:   Drug abuse and dependence (HCC)   Hematuria   Staphylococcus aureus bacteremia   UTI (urinary tract infection)   Septic arthritis of vertebra (HCC)   Hypokalemia   Microcytic anemia   Hepatitis C virus infection    enoxaparin (LOVENOX) injection  40 mg Subcutaneous Q24H   naproxen  500 mg Oral BID WC   nicotine  21 mg Transdermal Daily   QUEtiapine  75 mg Oral QHS   senna-docusate  1 tablet Oral BID   sodium chloride flush  3 mL Intravenous Q12H    SUBJECTIVE:  Afebrile overnight with no acute events. Getting things set up for discharge and has found a suboxone clinic. Slept well last night.   No Known Allergies   Review of Systems: Review of Systems  Constitutional:  Negative for chills, fever and weight loss.  Respiratory:  Negative for cough, shortness of breath and wheezing.   Cardiovascular:  Negative for chest pain and leg swelling.  Gastrointestinal:  Negative for abdominal pain, constipation, diarrhea, nausea and vomiting.  Skin:  Negative for rash.     OBJECTIVE: Vitals:   01/18/22 1315 01/18/22 1946 01/19/22 0413 01/19/22 0751  BP: 131/82 124/76 127/88 (!) 143/98  Pulse: 88 76 80 86  Resp:  18 18 16   Temp: 98.6 F (37 C) 98.3 F (36.8 C) 98.2 F (36.8 C) 97.7 F (36.5 C)  TempSrc:  Oral   Oral  SpO2: 100% 100% 100% 100%  Weight:      Height:       Body mass index is 22.71 kg/m.  Physical Exam Constitutional:      General: She is not in acute distress.    Appearance: She is well-developed.     Comments: Lying in bed; pleasant.   Cardiovascular:     Rate and Rhythm: Normal rate and regular rhythm.     Heart sounds: Normal heart sounds.  Pulmonary:     Effort: Pulmonary effort is normal.     Breath sounds: Normal breath sounds.  Skin:    General: Skin is warm and dry.  Neurological:     Mental Status: She is alert and oriented to person, place, and time.  Psychiatric:        Mood and Affect: Mood normal.    Lab Results Lab Results  Component Value Date   WBC 6.2 01/14/2022   HGB 10.2 (L) 01/14/2022   HCT 30.9 (L) 01/14/2022   MCV 78.2 (L) 01/14/2022   PLT 388 01/14/2022    Lab Results  Component Value Date   CREATININE 0.56 01/14/2022   BUN 9 01/14/2022   NA 138 01/14/2022   K 3.8 01/14/2022   CL 103 01/14/2022   CO2 23 01/14/2022    Lab Results  Component Value Date   ALT 13 01/10/2022  AST 14 (L) 01/10/2022   ALKPHOS 71 01/10/2022   BILITOT 0.5 01/10/2022     Microbiology: Recent Results (from the past 240 hour(s))  Blood culture (routine x 2)     Status: Abnormal   Collection Time: 01/10/22  5:12 AM   Specimen: BLOOD  Result Value Ref Range Status   Specimen Description   Final    BLOOD LEFT ANTECUBITAL Performed at [facility] [address], [place], [place] [address]    Special Requests   Final    BOTTLES DRAWN AEROBIC AND ANAEROBIC Blood Culture results may not be optimal due to an inadequate volume of blood received in culture bottles Performed at [facility] [address], [place], [place] [address]    Culture  Setup Time   Final    GRAM POSITIVE COCCI AEROBIC BOTTLE ONLY CRITICAL RESULT CALLED TO, READ BACK BY AND VERIFIED WITH: PHARMD TONY  RUDISILL ON 01/10/22 @ [id number] BY  DRT Performed at [facility], [place] [address], [place], [place] [address]    Culture STAPHYLOCOCCUS AUREUS (A)  Final   Report Status 01/12/2022 FINAL  Final   Organism ID, Bacteria STAPHYLOCOCCUS AUREUS  Final      Susceptibility   Staphylococcus aureus - MIC*    CIPROFLOXACIN <=0.5 SENSITIVE Sensitive     ERYTHROMYCIN <=0.25 SENSITIVE Sensitive     GENTAMICIN <=0.5 SENSITIVE Sensitive     OXACILLIN 0.5 SENSITIVE Sensitive     TETRACYCLINE <=1 SENSITIVE Sensitive     VANCOMYCIN 1 SENSITIVE Sensitive     TRIMETH/SULFA <=10 SENSITIVE Sensitive     CLINDAMYCIN <=0.25 SENSITIVE Sensitive     RIFAMPIN <=0.5 SENSITIVE Sensitive     Inducible Clindamycin NEGATIVE Sensitive     * STAPHYLOCOCCUS AUREUS  Blood Culture ID Panel (Reflexed)     Status: Abnormal   Collection Time: 01/10/22  5:12 AM  Result Value Ref Range Status   Enterococcus faecalis NOT DETECTED NOT DETECTED Final   Enterococcus Faecium NOT DETECTED NOT DETECTED Final   Listeria monocytogenes NOT DETECTED NOT DETECTED Final   Staphylococcus species DETECTED (A) NOT DETECTED Final    Comment: CRITICAL RESULT CALLED TO, READ BACK BY AND VERIFIED WITH: PHARMD TONY  RUDISILL ON 01/10/22 @ 2309 BY DRT    Staphylococcus aureus (BCID) DETECTED (A) NOT DETECTED Final    Comment: CRITICAL RESULT CALLED TO, READ BACK BY AND VERIFIED WITH: PHARMD TONY  RUDISILL ON 01/10/22 @ 2309 BY DRT    Staphylococcus epidermidis NOT DETECTED NOT DETECTED Final   Staphylococcus lugdunensis NOT DETECTED NOT DETECTED Final   Streptococcus species NOT DETECTED NOT DETECTED Final   Streptococcus agalactiae NOT DETECTED NOT DETECTED Final   Streptococcus pneumoniae NOT DETECTED NOT DETECTED Final   Streptococcus pyogenes NOT DETECTED NOT DETECTED Final   A.calcoaceticus-baumannii NOT DETECTED NOT DETECTED Final   Bacteroides fragilis NOT DETECTED NOT DETECTED Final   Enterobacterales NOT DETECTED NOT DETECTED Final   Enterobacter cloacae  complex NOT DETECTED NOT DETECTED Final   Escherichia coli NOT DETECTED NOT DETECTED Final   Klebsiella aerogenes NOT DETECTED NOT DETECTED Final   Klebsiella oxytoca NOT DETECTED NOT DETECTED Final   Klebsiella pneumoniae NOT DETECTED NOT DETECTED Final   Proteus species NOT DETECTED NOT DETECTED Final   Salmonella species NOT DETECTED NOT DETECTED Final   Serratia marcescens NOT DETECTED NOT DETECTED Final   Haemophilus influenzae NOT DETECTED NOT DETECTED Final   Neisseria meningitidis NOT DETECTED NOT DETECTED Final  Pseudomonas aeruginosa NOT DETECTED NOT DETECTED Final   Stenotrophomonas maltophilia NOT DETECTED NOT DETECTED Final   Candida albicans NOT DETECTED NOT DETECTED Final   Candida auris NOT DETECTED NOT DETECTED Final   Candida glabrata NOT DETECTED NOT DETECTED Final   Candida krusei NOT DETECTED NOT DETECTED Final   Candida parapsilosis NOT DETECTED NOT DETECTED Final   Candida tropicalis NOT DETECTED NOT DETECTED Final   Cryptococcus neoformans/gattii NOT DETECTED NOT DETECTED Final   Meth resistant mecA/C and MREJ NOT DETECTED NOT DETECTED Final    Comment: Performed at [facility], [place] [address], [place], [place] [address]  Blood culture (routine x 2)     Status: Abnormal   Collection Time: 01/10/22  5:24 AM   Specimen: BLOOD  Result Value Ref Range Status   Specimen Description   Final    BLOOD RIGHT ANTECUBITAL Performed at [facility] [address] [place], [place] [address]    Special Requests   Final    BOTTLES DRAWN AEROBIC AND ANAEROBIC Blood Culture adequate volume Performed at [facility] [address] [place], [place] [address]    Culture  Setup Time   Final    GRAM POSITIVE COCCI ANAEROBIC BOTTLE ONLY CRITICAL VALUE NOTED.  VALUE IS CONSISTENT WITH PREVIOUSLY REPORTED AND CALLED VALUE.    Culture (A)  Final    STAPHYLOCOCCUS AUREUS SUSCEPTIBILITIES PERFORMED ON PREVIOUS CULTURE WITHIN THE  LAST 5 DAYS. Performed at [facility], [place] [address], [place], [place] [address]    Report Status 01/15/2022 FINAL  Final  Culture, blood (Routine X 2) w Reflex to ID Panel     Status: None   Collection Time: 01/12/22  5:55 AM   Specimen: BLOOD LEFT HAND  Result Value Ref Range Status   Specimen Description BLOOD LEFT HAND  Final   Special Requests   Final    BOTTLES DRAWN AEROBIC AND ANAEROBIC Blood Culture adequate volume   Culture   Final    NO GROWTH 5 DAYS Performed at [facility], [place] [address] [place], [place] [address]    Report Status 01/17/2022 FINAL  Final  Culture, blood (Routine X 2) w Reflex to ID Panel     Status: None   Collection Time: 01/12/22  5:55 AM   Specimen: BLOOD RIGHT HAND  Result Value Ref Range Status   Specimen Description BLOOD RIGHT HAND  Final   Special Requests   Final    BOTTLES DRAWN AEROBIC AND ANAEROBIC Blood Culture adequate volume   Culture   Final    NO GROWTH 5 DAYS Performed at [facility], [place] [address], [place], [place] [address]    Report Status 01/17/2022 FINAL  Final     Terri Piedra, NP [facility]  01/19/2022  2:14 PM

## 2022-01-19 NOTE — Progress Notes (Incomplete)
Cefadroxil - TOC at d/c

## 2022-01-20 DIAGNOSIS — M4656 Other infective spondylopathies, lumbar region: Secondary | ICD-10-CM | POA: Diagnosis not present

## 2022-01-20 MED ORDER — BUPRENORPHINE HCL-NALOXONE HCL 8-2 MG SL SUBL
1.0000 | SUBLINGUAL_TABLET | Freq: Two times a day (BID) | SUBLINGUAL | Status: DC
  Administered 2022-01-22 – 2022-01-24 (×5): 1 via SUBLINGUAL
  Filled 2022-01-20 (×5): qty 1

## 2022-01-20 MED ORDER — BUPRENORPHINE HCL-NALOXONE HCL 2-0.5 MG SL SUBL
1.0000 | SUBLINGUAL_TABLET | Freq: Every day | SUBLINGUAL | Status: AC
  Administered 2022-01-21: 1 via SUBLINGUAL
  Filled 2022-01-20: qty 1

## 2022-01-20 MED ORDER — ENSURE ENLIVE PO LIQD
237.0000 mL | Freq: Two times a day (BID) | ORAL | Status: DC
  Administered 2022-01-20 – 2022-01-24 (×9): 237 mL via ORAL

## 2022-01-20 NOTE — Progress Notes (Signed)
PROGRESS NOTE    [name]  FFM:[id number] DOB: March 26, 1996 DOA: 01/10/2022 PCP: Merryl Hacker, No    Brief Narrative:   Krystal Valdez is a 26 y.o. female with past medical history significant for current IV drug use, mood disorder who presented to [facility] ED on 5/29 with low back pain.  Patient also with subjective fever/chills, no pain relief at home using Tylenol, Benadryl, heroin and heat therapy.  Also admitted to some nausea.  Pain is radiating from the left lower back to her abdomen.  In the ED, WBC 10.7, ESR 75, CRP 13.2, lactic acid 1.  MR T/L-spine with likely septic arthritis L2-3 with 10 x 15 mm fluid collection.  Patient was started vancomycin and cefepime.  EDP consulted with neurosurgery and did not believe there was any acute need for surgical intervention; and recommended ID consult and possible IR for aspiration.  Hospital service consulted for further evaluation and management of arthritis.   Assessment & Plan:   Septic arthritis L-spine L2-L3 with fluid collection MSSA bacteremia Patient presenting with low back pain with elevated ESR/CRP with septic arthritis at L2-3 with 10 x 15 mm fluid collection on MR T/L-spine.  EDP discussed with neurosurgery, did not believe there is any acute need for surgical intervention.  IR was consulted, area of fluid collection too small to obtain sample.  TEE negative for endocarditis. --Infectious disease following, appreciate assistance --Repeat blood cultures 5/31: No growth x5 days --Cefazolin 2 g IV q8h x 14 days (thru 6/12); will transition to p.o. cefadroxil to complete antibiotic course  IV drug abuse Patient admits to taking heroin and methamphetamine.  UDS positive for amphetamines, opiates, THC.  Discussed need for complete cessation.  Patient interested in Suboxone therapy after discharge and has appointment scheduled on 01/26/2022 with Lorriane Shire at [facility] in [place]. Seen by TOC. --Start Suboxone 6/9  Hepatitis  C HCVRNA detectable at 30 copies. HCV clearing on her own will need HCVRNA in 3 months.  Hep B surface antigen negative hep B antibody less than 0.1 core hep b total antibody nonreactive,hep A antibody reactive.  Outpatient follow-up with infectious disease  Hypokalemia: Resolved Repleted during hospitalization  Microcytic anemia Stable    DVT prophylaxis: enoxaparin (LOVENOX) injection 40 mg Start: 01/10/22 2030    Code Status: Full Code Family Communication: No family present at bedside this morning  Disposition Plan:  Level of care: Med-Surg Status is: Inpatient Remains inpatient appropriate because: Needs to complete IV antibiotics inpatient due to ongoing IV drug abuse anticipate discharge on evening of 6/12 vs morning of 6/13    Consultants:  Infectious disease  Procedures:  None  Antimicrobials:  Vancomycin 5/29 - 5/29 Cefepime 5/29 - 5/29 Cefazolin 5/30>>   Subjective: Patient seen examined bedside, resting comfortably.  In good spirits this morning.  Wants to take shower.  Staff was able to take her outside for a smoke break yesterday which she truly appreciated.  No other specific complaints or concerns at this time.  Denies headache, no visual changes, no chest pain, palpitations, no shortness of breath, no abdominal pain, no fever/chills/night sweats, no nausea/vomiting/diarrhea, no cough/congestion, no focal weakness, no fatigue, no paresthesias.  No acute events overnight per nursing staff.  Objective: Vitals:   01/19/22 0751 01/19/22 1602 01/19/22 2014 01/20/22 0907  BP: (!) 143/98 138/81 121/76 129/72  Pulse: 86 90 86 100  Resp: 16 16 18 18   Temp: 97.7 F (36.5 C) 98.4 F (36.9 C) 97.9 F (36.6  C) 97.7 F (36.5 C)  TempSrc: Oral Oral    SpO2: 100% 100% 99%   Weight:      Height:        Intake/Output Summary (Last 24 hours) at 01/20/2022 1243 Last data filed at 01/19/2022 1920 Gross per 24 hour  Intake 240 ml  Output --  Net 240 ml   Filed  Weights   01/14/22 1038  Weight: 65.8 kg    Examination:  Physical Exam: GEN: NAD, alert and oriented x 3, appears older than stated age HEENT: NCAT, PERRL, EOMI, sclera clear, MMM PULM: CTAB w/o wheezes/crackles, normal respiratory effort, on room air CV: RRR w/o M/G/R GI: abd soft, NTND, NABS, no R/G/M MSK: no peripheral edema, muscle strength globally intact 5/5 bilateral upper/lower extremities NEURO: CN II-XII intact, no focal deficits, sensation to light touch intact PSYCH: normal mood/affect Integumentary: dry/intact, no rashes or wounds    Data Reviewed: I have personally reviewed following labs and imaging studies  CBC: Recent Labs  Lab 01/14/22 0943  WBC 6.2  HGB 10.2*  HCT 30.9*  MCV 78.2*  PLT 774   Basic Metabolic Panel: Recent Labs  Lab 01/14/22 0242  NA 138  K 3.8  CL 103  CO2 23  GLUCOSE 104*  BUN 9  CREATININE 0.56  CALCIUM 8.8*   GFR: Estimated Creatinine Clearance: 102.7 mL/min (by C-G formula based on SCr of 0.56 mg/dL). Liver Function Tests: No results for input(s): "AST", "ALT", "ALKPHOS", "BILITOT", "PROT", "ALBUMIN" in the last 168 hours. No results for input(s): "LIPASE", "AMYLASE" in the last 168 hours. No results for input(s): "AMMONIA" in the last 168 hours. Coagulation Profile: No results for input(s): "INR", "PROTIME" in the last 168 hours. Cardiac Enzymes: No results for input(s): "CKTOTAL", "CKMB", "CKMBINDEX", "TROPONINI" in the last 168 hours. BNP (last 3 results) No results for input(s): "PROBNP" in the last 8760 hours. HbA1C: No results for input(s): "HGBA1C" in the last 72 hours. CBG: No results for input(s): "GLUCAP" in the last 168 hours. Lipid Profile: No results for input(s): "CHOL", "HDL", "LDLCALC", "TRIG", "CHOLHDL", "LDLDIRECT" in the last 72 hours. Thyroid Function Tests: No results for input(s): "TSH", "T4TOTAL", "FREET4", "T3FREE", "THYROIDAB" in the last 72 hours. Anemia Panel: No results for input(s):  "VITAMINB12", "FOLATE", "FERRITIN", "TIBC", "IRON", "RETICCTPCT" in the last 72 hours. Sepsis Labs: No results for input(s): "PROCALCITON", "LATICACIDVEN" in the last 168 hours.  Recent Results (from the past 240 hour(s))  Culture, blood (Routine X 2) w Reflex to ID Panel     Status: None   Collection Time: 01/12/22  5:55 AM   Specimen: BLOOD LEFT HAND  Result Value Ref Range Status   Specimen Description BLOOD LEFT HAND  Final   Special Requests   Final    BOTTLES DRAWN AEROBIC AND ANAEROBIC Blood Culture adequate volume   Culture   Final    NO GROWTH 5 DAYS Performed at [facility], [address], [place], [place] [address]    Report Status 01/17/2022 FINAL  Final  Culture, blood (Routine X 2) w Reflex to ID Panel     Status: None   Collection Time: 01/12/22  5:55 AM   Specimen: BLOOD RIGHT HAND  Result Value Ref Range Status   Specimen Description BLOOD RIGHT HAND  Final   Special Requests   Final    BOTTLES DRAWN AEROBIC AND ANAEROBIC Blood Culture adequate volume   Culture   Final    NO GROWTH 5 DAYS Performed at [facility], [place] [address], [place], [place] [address]    Report Status 01/17/2022 FINAL  Final         Radiology Studies: No results found.      Scheduled Meds:  enoxaparin (LOVENOX) injection  40 mg Subcutaneous Q24H   naproxen  500 mg Oral BID WC   nicotine  21 mg Transdermal Daily   QUEtiapine  75 mg Oral QHS   senna-docusate  1 tablet Oral BID   sodium chloride flush  3 mL Intravenous Q12H   Continuous Infusions:  sodium chloride      ceFAZolin (ANCEF) IV 2 g (01/20/22 1049)   methocarbamol (ROBAXIN) IV 500 mg (01/18/22 1137)     LOS: 10 days    Time spent: 41 minutes spent on chart review, discussion with nursing staff, consultants, updating family and interview/physical exam; more than 50% of that time was spent in counseling and/or coordination of care.    Damacio Weisgerber J [place] DO [facility] Available via Epic  secure chat 7am-7pm After these hours, please refer to coverage provider listed on [email or web address] 01/20/2022, 12:43 PM

## 2022-01-20 NOTE — Progress Notes (Signed)
Initial Nutrition Assessment  DOCUMENTATION CODES:   Not applicable  INTERVENTION:  Provide Ensure Enlive po BID, each supplement provides 350 kcal and 20 grams of protein.  Encourage adequate PO intake.   NUTRITION DIAGNOSIS:   Increased nutrient needs related to acute illness as evidenced by estimated needs.  GOAL:   Patient will meet greater than or equal to 90% of their needs  MONITOR:   PO intake, Supplement acceptance, Labs, Weight trends, Skin, I & O's  REASON FOR ASSESSMENT:   Consult Assessment of nutrition requirement/status  ASSESSMENT:   26 y.o. female with past medical history significant for current IV drug use presents with back pain. MR T/L-spine with likely septic arthritis L2-3, MSSA bacteremia. Pt with hepatitis C.  Pt unavailable at time of contact. RD to order nutritional supplements to aid in caloric and protein needs. Unable to complete Nutrition-Focused physical exam at this time. Labs and medications reviewed.   Diet Order:   Diet Order             Diet regular Room service appropriate? Yes; Fluid consistency: Thin  Diet effective now                   EDUCATION NEEDS:   Not appropriate for education at this time  Skin:  Skin Assessment: Reviewed RN Assessment  Last BM:  6/8  Height:   Ht Readings from Last 1 Encounters:  01/14/22 5\' 7"  (1.702 m)    Weight:   Wt Readings from Last 1 Encounters:  01/14/22 65.8 kg   BMI:  Body mass index is 22.71 kg/m.  Estimated Nutritional Needs:   Kcal:  1900-2100  Protein:  95-105 grams  Fluid:  >/= 2 L/day  Corrin Parker, MS, RD, LDN RD pager number/after hours weekend pager number on Amion.

## 2022-01-20 NOTE — Progress Notes (Signed)
Pt left w/staff to go on smoke break. Pt is very pleasant and followed all rules. Education given that we can't always take her outside when she's ready. She stated that she understood.

## 2022-01-20 NOTE — Progress Notes (Signed)
   01/20/22 1445  Clinical Encounter Type  Visited With Patient  Visit Type Initial;Spiritual support  Referral From Nurse  Consult/Referral To Chaplain   Chaplain responded to a spiritual consult.The request was for an assessment of spiritual needs. On entering the room it was dark and patient was in a conversation with someone on her phone. She stopped the conversation and focused with me. She was not aware of asking for any sort of assessment and decline the visit.   Danice Goltz [facility]  [phone]

## 2022-01-21 ENCOUNTER — Other Ambulatory Visit (HOSPITAL_COMMUNITY): Payer: Self-pay

## 2022-01-21 DIAGNOSIS — M4656 Other infective spondylopathies, lumbar region: Secondary | ICD-10-CM | POA: Diagnosis not present

## 2022-01-21 MED ORDER — CEFADROXIL 500 MG PO CAPS
1000.0000 mg | ORAL_CAPSULE | Freq: Two times a day (BID) | ORAL | 0 refills | Status: AC
  Filled 2022-01-21: qty 112, 28d supply, fill #0

## 2022-01-21 NOTE — Progress Notes (Signed)
PROGRESS NOTE    Krystal Valdez  HYI:[id number] DOB: 27-Jan-1996 DOA: 01/10/2022 PCP: Merryl Hacker, No    Brief Narrative:   Krystal Valdez is a 26 y.o. female with past medical history significant for current IV drug use, mood disorder who presented to [facility] ED on 5/29 with low back pain.  Patient also with subjective fever/chills, no pain relief at home using Tylenol, Benadryl, heroin and heat therapy.  Also admitted to some nausea.  Pain is radiating from the left lower back to her abdomen.  In the ED, WBC 10.7, ESR 75, CRP 13.2, lactic acid 1.  MR T/L-spine with likely septic arthritis L2-3 with 10 x 15 mm fluid collection.  Patient was started vancomycin and cefepime.  EDP consulted with neurosurgery and did not believe there was any acute need for surgical intervention; and recommended ID consult and possible IR for aspiration.  Hospital service consulted for further evaluation and management of arthritis.   Assessment & Plan:   Septic arthritis L-spine L2-L3 with fluid collection MSSA bacteremia Patient presenting with low back pain with elevated ESR/CRP with septic arthritis at L2-3 with 10 x 15 mm fluid collection on MR T/L-spine.  EDP discussed with neurosurgery, did not believe there is any acute need for surgical intervention.  IR was consulted, area of fluid collection too small to obtain sample.  TEE negative for endocarditis. --Infectious disease following, appreciate assistance --Repeat blood cultures 5/31: No growth x5 days --Cefazolin 2 g IV q8h x 14 days (thru 6/12); will transition to p.o. cefadroxil to complete antibiotic course  IV drug abuse Patient admits to taking heroin and methamphetamine.  UDS positive for amphetamines, opiates, THC.  Discussed need for complete cessation.  Patient interested in Suboxone therapy after discharge and has appointment scheduled on 01/26/2022 with Lorriane Shire at [facility] in [place]. Seen by TOC. --Start Suboxone 6/9  Hepatitis  C HCVRNA detectable at 30 copies. HCV clearing on her own will need HCVRNA in 3 months.  Hep B surface antigen negative hep B antibody less than 0.1 core hep b total antibody nonreactive,hep A antibody reactive.  Outpatient follow-up with infectious disease  Hypokalemia: Resolved Repleted during hospitalization  Microcytic anemia Stable    DVT prophylaxis: enoxaparin (LOVENOX) injection 40 mg Start: 01/10/22 2030    Code Status: Full Code Family Communication: No family present at bedside this morning  Disposition Plan:  Level of care: Med-Surg Status is: Inpatient Remains inpatient appropriate because: Needs to complete IV antibiotics inpatient due to ongoing IV drug abuse anticipate discharge on evening of 6/12     Consultants:  Infectious disease  Procedures:  None  Antimicrobials:  Vancomycin 5/29 - 5/29 Cefepime 5/29 - 5/29 Cefazolin 5/30>>   Subjective: Patient seen examined bedside, resting comfortably.  Sleeping but easy arousable.  Upset about moved from [address] to [address] overnight.  Feels like this is a "dungeon".  No other complaints or concerns at this time.  Cannot wait until discharged on Monday possibly if completes antibiotics at a reasonable hour.  Denies headache, no visual changes, no chest pain, palpitations, no shortness of breath, no abdominal pain, no fever/chills/night sweats, no nausea/vomiting/diarrhea, no cough/congestion, no focal weakness, no fatigue, no paresthesias.  No acute events overnight per nursing staff.  Objective: Vitals:   01/19/22 2014 01/20/22 0907 01/20/22 1741 01/20/22 2003  BP: 121/76 129/72 131/87 122/75  Pulse: 86 100 93 96  Resp: 18 18 18    Temp: 97.9 F (36.6 C) 97.7  F (36.5 C) 97.9 F (36.6 C) 98.4 F (36.9 C)  TempSrc:   Oral Oral  SpO2: 99%  100% 96%  Weight:      Height:        Intake/Output Summary (Last 24 hours) at 01/21/2022 0930 Last data filed at 01/21/2022 0920 Gross per 24 hour  Intake 10 ml  Output  --  Net 10 ml   Filed Weights   01/14/22 1038  Weight: 65.8 kg    Examination:  Physical Exam: GEN: NAD, alert and oriented x 3, appears older than stated age HEENT: NCAT, PERRL, EOMI, sclera clear, MMM PULM: CTAB w/o wheezes/crackles, normal respiratory effort, on room air CV: RRR w/o M/G/R GI: abd soft, NTND, NABS, no R/G/M MSK: no peripheral edema, muscle strength globally intact 5/5 bilateral upper/lower extremities NEURO: CN II-XII intact, no focal deficits, sensation to light touch intact PSYCH: normal mood/affect Integumentary: dry/intact, no rashes or wounds    Data Reviewed: I have personally reviewed following labs and imaging studies  CBC: Recent Labs  Lab 01/14/22 0943  WBC 6.2  HGB 10.2*  HCT 30.9*  MCV 78.2*  PLT 045   Basic Metabolic Panel: No results for input(s): "NA", "K", "CL", "CO2", "GLUCOSE", "BUN", "CREATININE", "CALCIUM", "MG", "PHOS" in the last 168 hours.  GFR: Estimated Creatinine Clearance: 102.7 mL/min (by C-G formula based on SCr of 0.56 mg/dL). Liver Function Tests: No results for input(s): "AST", "ALT", "ALKPHOS", "BILITOT", "PROT", "ALBUMIN" in the last 168 hours. No results for input(s): "LIPASE", "AMYLASE" in the last 168 hours. No results for input(s): "AMMONIA" in the last 168 hours. Coagulation Profile: No results for input(s): "INR", "PROTIME" in the last 168 hours. Cardiac Enzymes: No results for input(s): "CKTOTAL", "CKMB", "CKMBINDEX", "TROPONINI" in the last 168 hours. BNP (last 3 results) No results for input(s): "PROBNP" in the last 8760 hours. HbA1C: No results for input(s): "HGBA1C" in the last 72 hours. CBG: No results for input(s): "GLUCAP" in the last 168 hours. Lipid Profile: No results for input(s): "CHOL", "HDL", "LDLCALC", "TRIG", "CHOLHDL", "LDLDIRECT" in the last 72 hours. Thyroid Function Tests: No results for input(s): "TSH", "T4TOTAL", "FREET4", "T3FREE", "THYROIDAB" in the last 72 hours. Anemia  Panel: No results for input(s): "VITAMINB12", "FOLATE", "FERRITIN", "TIBC", "IRON", "RETICCTPCT" in the last 72 hours. Sepsis Labs: No results for input(s): "PROCALCITON", "LATICACIDVEN" in the last 168 hours.  Recent Results (from the past 240 hour(s))  Culture, blood (Routine X 2) w Reflex to ID Panel     Status: None   Collection Time: 01/12/22  5:55 AM   Specimen: BLOOD LEFT HAND  Result Value Ref Range Status   Specimen Description BLOOD LEFT HAND  Final   Special Requests   Final    BOTTLES DRAWN AEROBIC AND ANAEROBIC Blood Culture adequate volume   Culture   Final    NO GROWTH 5 DAYS Performed at [facility], [address] [place], [place] [address]    Report Status 01/17/2022 FINAL  Final  Culture, blood (Routine X 2) w Reflex to ID Panel     Status: None   Collection Time: 01/12/22  5:55 AM   Specimen: BLOOD RIGHT HAND  Result Value Ref Range Status   Specimen Description BLOOD RIGHT HAND  Final   Special Requests   Final    BOTTLES DRAWN AEROBIC AND ANAEROBIC Blood Culture adequate volume   Culture   Final    NO GROWTH 5 DAYS Performed at [facility], [place]  [address], [place], [place] [address]    Report Status 01/17/2022 FINAL  Final         Radiology Studies: No results found.      Scheduled Meds:  [START ON 01/22/2022] buprenorphine-naloxone  1 tablet Sublingual BID   enoxaparin (LOVENOX) injection  40 mg Subcutaneous Q24H   feeding supplement  237 mL Oral BID BM   nicotine  21 mg Transdermal Daily   QUEtiapine  75 mg Oral QHS   senna-docusate  1 tablet Oral BID   sodium chloride flush  3 mL Intravenous Q12H   Continuous Infusions:  sodium chloride      ceFAZolin (ANCEF) IV Stopped (01/21/22 0800)   methocarbamol (ROBAXIN) IV 500 mg (01/18/22 1137)     LOS: 11 days    Time spent: 41 minutes spent on chart review, discussion with nursing staff, consultants, updating family and interview/physical exam; more than 50% of that time  was spent in counseling and/or coordination of care.    Prayan Ulin J [place] DO [facility] Available via Epic secure chat 7am-7pm After these hours, please refer to coverage provider listed on [email or web address] 01/21/2022, 9:30 AM

## 2022-01-21 NOTE — TOC Initial Note (Addendum)
Transition of Care [facility]) - Initial/Assessment Note    Patient Details  Name: Krystal Valdez MRN: [id number] Date of Birth: 26-Apr-1996  Transition of Care [facility]) CM/SW Contact:    Curlene Labrum, RN Phone Number: 01/21/2022, 10:01 AM  Clinical Narrative:                 CM met with the patient to discuss transitions of care - pending discharge date to home on 01/24/2022 after completion on IV antibiotics.  The patient remains Inpatient due to needs for IV antibiotics for Septic arthritis L2-3.  Patient with history of IV drug abuse including heroine and methamphetamines and is not a safe discharge to home for IV antibiotics.  The patient plans to go home to [place], [place], [place] on Monday 01/24/22 after completion of IV antibiotics and plans to follow up with [facility] clinic in [place], [place] and has Medicaid transportation through [facility] to the clinic.  Outpatient discharge instructions included for Substance abuse counseling.  CM and MSW with DTP team will continue to follow the patient for discharge needs to home on 01/24/22 evening.  Expected Discharge Plan: Home/Self Care Barriers to Discharge: Continued Medical Work up   Patient Goals and CMS Choice Patient states their goals for this hospitalization and ongoing recovery are:: Patient needs IV antibiotics through 6/12 - plans to discharge home with family. CMS Medicare.gov Compare Post Acute Care list provided to:: Patient Choice offered to / list presented to : Patient  Expected Discharge Plan and Services Expected Discharge Plan: Home/Self Care   Discharge Planning Services: CM Consult                                          Prior Living Arrangements/Services   Lives with:: Parents (lives with Father and grandmother at listed address in chart) Patient language and need for interpreter reviewed:: Yes Do you feel safe going back to the place where you live?: Yes      Need  for Family Participation in Patient Care: Yes (Comment) Care giver support system in place?: Yes (comment)   Criminal Activity/Legal Involvement Pertinent to Current Situation/Hospitalization: No - Comment as needed  Activities of Daily Living Home Assistive Devices/Equipment: None ADL Screening (condition at time of admission) Patient's cognitive ability adequate to safely complete daily activities?: Yes Is the patient deaf or have difficulty hearing?: No Does the patient have difficulty seeing, even when wearing glasses/contacts?: No Does the patient have difficulty concentrating, remembering, or making decisions?: No Patient able to express need for assistance with ADLs?: Yes Does the patient have difficulty dressing or bathing?: No Independently performs ADLs?: Yes (appropriate for developmental age) Does the patient have difficulty walking or climbing stairs?: No Weakness of Legs: None Weakness of Arms/Hands: None  Permission Sought/Granted Permission sought to share information with : Case Manager, Family Supports                Emotional Assessment Appearance:: Appears stated age Attitude/Demeanor/Rapport: Ambitious Affect (typically observed): Accepting Orientation: : Oriented to Self, Oriented to Place, Oriented to  Time, Oriented to Situation Alcohol / Substance Use: Illicit Drugs Psych Involvement: No (comment)  Admission diagnosis:  Septic arthritis of vertebra, due to unspecified organism [facility]) [M46.50] Septic arthritis of lumbar spine [facility]) [M46.56] Patient Active Problem List   Diagnosis Date Noted   Hepatitis C virus infection 01/14/2022  Hypokalemia 01/12/2022   Microcytic anemia 01/12/2022   Staphylococcus aureus bacteremia 01/11/2022   UTI (urinary tract infection) 01/11/2022   Septic arthritis of vertebra (HCC)    Septic arthritis of lumbar spine ([place]) 01/10/2022   Drug abuse and dependence ([place]) 01/10/2022   Hematuria 01/10/2022   Left breast  abscess 04/02/2013   Mastitis, left, acute 03/28/2013   Vaginal delivery 03/14/2013   Asthma 11/02/2012   PCP:  Pcp, No Pharmacy:   [id number] [id number]: [phone]: [phone]    Social Determinants of Health (SDOH) Interventions    Readmission Risk Interventions    01/21/2022   10:00 AM  Readmission Risk Prevention Plan  Post Dischage Appt Complete  Medication Screening Complete  Transportation Screening Complete

## 2022-01-21 NOTE — Progress Notes (Signed)
Patient added to DTP list for discharge planning. ? ?Krystal Valdez, MSW, LCSW ?Transitions of Care  Clinical Social Worker II ?[phone] ? ?

## 2022-01-21 NOTE — Progress Notes (Signed)
This nurse assuming care, pt awake, alert laying in bed resting comfortably.  Denies, pain, chest pain and SOB at this time. No acute distress noted.  Care and monitoring continue.

## 2022-01-21 NOTE — TOC Benefit Eligibility Note (Signed)
Patient Product/process development scientist completed.    The patient is currently admitted and upon discharge could be taking cefadroxil (Duricef) 500 mg capsules.  The current 28 day co-pay is, $4.00.   The patient is insured through [facility]     Roland Earl, CPhT Pharmacy Patient Advocate Specialist [facility] Patient Advocate Team Direct Number: [phone]  Fax: [phone]

## 2022-01-22 DIAGNOSIS — M4656 Other infective spondylopathies, lumbar region: Secondary | ICD-10-CM | POA: Diagnosis not present

## 2022-01-22 NOTE — Progress Notes (Signed)
ID PROGRESS NOTE  Currently day 12 of IV abtx since clearance of bacteremia. Plan is to treat with 14 days with IV then change over to orals vs. IV dalbavancin/oritavancin for remainder of period for treatment of discitis  Opiate abuse - she has appointment post discharge to enroll in opiate treatmen/management program with subutex  Santrice Muzio B. Drue Second MD MPH [facility] [phone]

## 2022-01-22 NOTE — Progress Notes (Signed)
PROGRESS NOTE    Krystal Valdez  DHR:[id number] DOB: [birth year] DOA: 01/10/2022 PCP: Merryl Hacker, No    Brief Narrative:   Krystal Valdez is a 26 y.o. female with past medical history significant for current IV drug use, mood disorder who presented to [facility] ED on 5/29 with low back pain.  Patient also with subjective fever/chills, no pain relief at home using Tylenol, Benadryl, heroin and heat therapy.  Also admitted to some nausea.  Pain is radiating from the left lower back to her abdomen.  In the ED, WBC 10.7, ESR 75, CRP 13.2, lactic acid 1.  MR T/L-spine with likely septic arthritis L2-3 with 10 x 15 mm fluid collection.  Patient was started vancomycin and cefepime.  EDP consulted with neurosurgery and did not believe there was any acute need for surgical intervention; and recommended ID consult and possible IR for aspiration.  Hospital service consulted for further evaluation and management of arthritis.   Assessment & Plan:   Septic arthritis L-spine L2-L3 with fluid collection MSSA bacteremia Patient presenting with low back pain with elevated ESR/CRP with septic arthritis at L2-3 with 10 x 15 mm fluid collection on MR T/L-spine.  EDP discussed with neurosurgery, did not believe there is any acute need for surgical intervention.  IR was consulted, area of fluid collection too small to obtain sample.  TEE negative for endocarditis. --Infectious disease following, appreciate assistance --Repeat blood cultures 5/31: No growth x5 days --Cefazolin 2 g IV q8h x 14 days (thru 6/12); will transition to p.o. cefadroxil to complete antibiotic course  IV drug abuse Patient admits to taking heroin and methamphetamine.  UDS positive for amphetamines, opiates, THC.  Discussed need for complete cessation.  Patient interested in Suboxone therapy after discharge and has appointment scheduled on 01/26/2022 with Lorriane Shire at [facility] in [place]. Seen by TOC. --Started Suboxone 6/9  Hepatitis  C HCVRNA detectable at 30 copies. HCV clearing on her own will need HCVRNA in 3 months.  Hep B surface antigen negative hep B antibody less than 0.1 core hep b total antibody nonreactive,hep A antibody reactive.  Outpatient follow-up with infectious disease  Hypokalemia: Resolved Repleted during hospitalization  Microcytic anemia Stable    DVT prophylaxis: enoxaparin (LOVENOX) injection 40 mg Start: 01/10/22 2030    Code Status: Full Code Family Communication: No family present at bedside this morning  Disposition Plan:  Level of care: Med-Surg Status is: Inpatient Remains inpatient appropriate because: Needs to complete IV antibiotics inpatient due to ongoing IV drug abuse anticipate discharge on evening of 6/12     Consultants:  Infectious disease  Procedures:  None  Antimicrobials:  Vancomycin 5/29 - 5/29 Cefepime 5/29 - 5/29 Cefazolin 5/30>>   Subjective: Patient seen examined bedside, resting comfortably.  Happy about being able to go on a smoke break this morning.  No other specific complaints or concerns this morning.  Continues to endorse that she will have continued abstinence from illicits after discharge as she is already set up a appointment with subs abuse clinic on discharge.  Denies headache, no visual changes, no chest pain, palpitations, no shortness of breath, no abdominal pain, no fever/chills/night sweats, no nausea/vomiting/diarrhea, no cough/congestion, no focal weakness, no fatigue, no paresthesias.  No acute events overnight per nursing staff.  Objective: Vitals:   01/20/22 1741 01/20/22 2003 01/21/22 1000 01/21/22 2104  BP: 131/87 122/75 (!) 143/73 130/79  Pulse: 93 96 (!) 107 (!) 101  Resp: 18  20 20   Temp:  97.9 F (36.6 C) 98.4 F (36.9 C) 98 F (36.7 C) 99 F (37.2 C)  TempSrc: Oral Oral Oral Oral  SpO2: 100% 96%  100%  Weight:      Height:        Intake/Output Summary (Last 24 hours) at 01/22/2022 1011 Last data filed at 01/21/2022  2139 Gross per 24 hour  Intake 830 ml  Output --  Net 830 ml   Filed Weights   01/14/22 1038  Weight: 65.8 kg    Examination:  Physical Exam: GEN: NAD, alert and oriented x 3, appears older than stated age HEENT: NCAT, PERRL, EOMI, sclera clear, MMM PULM: CTAB w/o wheezes/crackles, normal respiratory effort, on room air CV: RRR w/o M/G/R GI: abd soft, NTND, NABS, no R/G/M MSK: no peripheral edema, muscle strength globally intact 5/5 bilateral upper/lower extremities NEURO: CN II-XII intact, no focal deficits, sensation to light touch intact PSYCH: normal mood/affect Integumentary: dry/intact, no rashes or wounds    Data Reviewed: I have personally reviewed following labs and imaging studies  CBC: No results for input(s): "WBC", "NEUTROABS", "HGB", "HCT", "MCV", "PLT" in the last 168 hours.  Basic Metabolic Panel: No results for input(s): "NA", "K", "CL", "CO2", "GLUCOSE", "BUN", "CREATININE", "CALCIUM", "MG", "PHOS" in the last 168 hours.  GFR: Estimated Creatinine Clearance: 102.7 mL/min (by C-G formula based on SCr of 0.56 mg/dL). Liver Function Tests: No results for input(s): "AST", "ALT", "ALKPHOS", "BILITOT", "PROT", "ALBUMIN" in the last 168 hours. No results for input(s): "LIPASE", "AMYLASE" in the last 168 hours. No results for input(s): "AMMONIA" in the last 168 hours. Coagulation Profile: No results for input(s): "INR", "PROTIME" in the last 168 hours. Cardiac Enzymes: No results for input(s): "CKTOTAL", "CKMB", "CKMBINDEX", "TROPONINI" in the last 168 hours. BNP (last 3 results) No results for input(s): "PROBNP" in the last 8760 hours. HbA1C: No results for input(s): "HGBA1C" in the last 72 hours. CBG: No results for input(s): "GLUCAP" in the last 168 hours. Lipid Profile: No results for input(s): "CHOL", "HDL", "LDLCALC", "TRIG", "CHOLHDL", "LDLDIRECT" in the last 72 hours. Thyroid Function Tests: No results for input(s): "TSH", "T4TOTAL", "FREET4",  "T3FREE", "THYROIDAB" in the last 72 hours. Anemia Panel: No results for input(s): "VITAMINB12", "FOLATE", "FERRITIN", "TIBC", "IRON", "RETICCTPCT" in the last 72 hours. Sepsis Labs: No results for input(s): "PROCALCITON", "LATICACIDVEN" in the last 168 hours.  No results found for this or any previous visit (from the past 240 hour(s)).        Radiology Studies: No results found.      Scheduled Meds:  buprenorphine-naloxone  1 tablet Sublingual BID   enoxaparin (LOVENOX) injection  40 mg Subcutaneous Q24H   feeding supplement  237 mL Oral BID BM   nicotine  21 mg Transdermal Daily   QUEtiapine  75 mg Oral QHS   senna-docusate  1 tablet Oral BID   sodium chloride flush  3 mL Intravenous Q12H   Continuous Infusions:  sodium chloride      ceFAZolin (ANCEF) IV 2 g (01/22/22 0915)   methocarbamol (ROBAXIN) IV 500 mg (01/18/22 1137)     LOS: 12 days    Time spent: 41 minutes spent on chart review, discussion with nursing staff, consultants, updating family and interview/physical exam; more than 50% of that time was spent in counseling and/or coordination of care.    Gor Vestal J [place] DO [facility] Available via Epic secure chat 7am-7pm After these hours, please refer to coverage provider listed on [email or web address] 01/22/2022, 10:11 AM

## 2022-01-23 DIAGNOSIS — M4656 Other infective spondylopathies, lumbar region: Secondary | ICD-10-CM | POA: Diagnosis not present

## 2022-01-23 NOTE — Progress Notes (Signed)
PROGRESS NOTE    Krystal Valdez  BWI:[id number] DOB: [birth year] DOA: 01/10/2022 PCP: Merryl Hacker, No    Brief Narrative:   Krystal Valdez is a 26 y.o. female with past medical history significant for current IV drug use, mood disorder who presented to [facility] ED on 5/29 with low back pain.  Patient also with subjective fever/chills, no pain relief at home using Tylenol, Benadryl, heroin and heat therapy.  Also admitted to some nausea.  Pain is radiating from the left lower back to her abdomen.  In the ED, WBC 10.7, ESR 75, CRP 13.2, lactic acid 1.  MR T/L-spine with likely septic arthritis L2-3 with 10 x 15 mm fluid collection.  Patient was started vancomycin and cefepime.  EDP consulted with neurosurgery and did not believe there was any acute need for surgical intervention; and recommended ID consult and possible IR for aspiration.  Hospital service consulted for further evaluation and management of arthritis.   Assessment & Plan:   Septic arthritis L-spine L2-L3 with fluid collection MSSA bacteremia Patient presenting with low back pain with elevated ESR/CRP with septic arthritis at L2-3 with 10 x 15 mm fluid collection on MR T/L-spine.  EDP discussed with neurosurgery, did not believe there is any acute need for surgical intervention.  IR was consulted, area of fluid collection too small to obtain sample.  TEE negative for endocarditis. --Infectious disease following, appreciate assistance --Repeat blood cultures 5/31: No growth x5 days --Cefazolin 2 g IV q8h x 14 days (thru 6/12); will transition to p.o. cefadroxil to complete antibiotic course  IV drug abuse Patient admits to taking heroin and methamphetamine.  UDS positive for amphetamines, opiates, THC.  Discussed need for complete cessation.  Patient interested in Suboxone therapy after discharge and has appointment scheduled on 01/26/2022 with Lorriane Shire at [facility] in [place]. Seen by TOC. --Started Suboxone 6/9  Hepatitis  C HCVRNA detectable at 30 copies. HCV clearing on her own will need HCVRNA in 3 months.  Hep B surface antigen negative hep B antibody less than 0.1 core hep b total antibody nonreactive,hep A antibody reactive.  Outpatient follow-up with infectious disease  Hypokalemia: Resolved Repleted during hospitalization  Microcytic anemia Stable    DVT prophylaxis: enoxaparin (LOVENOX) injection 40 mg Start: 01/10/22 2030    Code Status: Full Code Family Communication: No family present at bedside this morning  Disposition Plan:  Level of care: Med-Surg Status is: Inpatient Remains inpatient appropriate because: Needs to complete IV antibiotics inpatient due to ongoing IV drug abuse anticipate discharge on evening of 6/12     Consultants:  Infectious disease  Procedures:  None  Antimicrobials:  Vancomycin 5/29 - 5/29 Cefepime 5/29 - 5/29 Cefazolin 5/30>>   Subjective: Patient seen examined bedside, resting comfortably.  No specific complaints this morning.  Hopeful to discharge tomorrow although may be Tuesday morning dependent on completion of antibiotics.  Denies headache, no visual changes, no chest pain, palpitations, no shortness of breath, no abdominal pain, no fever/chills/night sweats, no nausea/vomiting/diarrhea, no cough/congestion, no focal weakness, no fatigue, no paresthesias.  No acute events overnight per nursing staff.  Objective: Vitals:   01/21/22 2104 01/22/22 1052 01/22/22 2042 01/23/22 0941  BP: 130/79 116/74 123/79 138/83  Pulse: (!) 101 (!) 115 (!) 111 (!) 116  Resp: 20 18 18    Temp: 99 F (37.2 C) 99 F (37.2 C) 98 F (36.7 C) 98.1 F (36.7 C)  TempSrc: Oral Oral Oral Oral  SpO2: 100% 99% 100% 99%  Weight:      Height:       No intake or output data in the 24 hours ending 01/23/22 1221  Filed Weights   01/14/22 1038  Weight: 65.8 kg    Examination:  Physical Exam: GEN: NAD, alert and oriented x 3, appears older than stated age HEENT:  NCAT, PERRL, EOMI, sclera clear, MMM PULM: CTAB w/o wheezes/crackles, normal respiratory effort, on room air CV: RRR w/o M/G/R GI: abd soft, NTND, NABS, no R/G/M MSK: no peripheral edema, muscle strength globally intact 5/5 bilateral upper/lower extremities NEURO: CN II-XII intact, no focal deficits, sensation to light touch intact PSYCH: normal mood/affect Integumentary: dry/intact, no rashes or wounds    Data Reviewed: I have personally reviewed following labs and imaging studies  CBC: No results for input(s): "WBC", "NEUTROABS", "HGB", "HCT", "MCV", "PLT" in the last 168 hours.  Basic Metabolic Panel: No results for input(s): "NA", "K", "CL", "CO2", "GLUCOSE", "BUN", "CREATININE", "CALCIUM", "MG", "PHOS" in the last 168 hours.  GFR: Estimated Creatinine Clearance: 102.7 mL/min (by C-G formula based on SCr of 0.56 mg/dL). Liver Function Tests: No results for input(s): "AST", "ALT", "ALKPHOS", "BILITOT", "PROT", "ALBUMIN" in the last 168 hours. No results for input(s): "LIPASE", "AMYLASE" in the last 168 hours. No results for input(s): "AMMONIA" in the last 168 hours. Coagulation Profile: No results for input(s): "INR", "PROTIME" in the last 168 hours. Cardiac Enzymes: No results for input(s): "CKTOTAL", "CKMB", "CKMBINDEX", "TROPONINI" in the last 168 hours. BNP (last 3 results) No results for input(s): "PROBNP" in the last 8760 hours. HbA1C: No results for input(s): "HGBA1C" in the last 72 hours. CBG: No results for input(s): "GLUCAP" in the last 168 hours. Lipid Profile: No results for input(s): "CHOL", "HDL", "LDLCALC", "TRIG", "CHOLHDL", "LDLDIRECT" in the last 72 hours. Thyroid Function Tests: No results for input(s): "TSH", "T4TOTAL", "FREET4", "T3FREE", "THYROIDAB" in the last 72 hours. Anemia Panel: No results for input(s): "VITAMINB12", "FOLATE", "FERRITIN", "TIBC", "IRON", "RETICCTPCT" in the last 72 hours. Sepsis Labs: No results for input(s): "PROCALCITON",  "LATICACIDVEN" in the last 168 hours.  No results found for this or any previous visit (from the past 240 hour(s)).        Radiology Studies: No results found.      Scheduled Meds:  buprenorphine-naloxone  1 tablet Sublingual BID   enoxaparin (LOVENOX) injection  40 mg Subcutaneous Q24H   feeding supplement  237 mL Oral BID BM   nicotine  21 mg Transdermal Daily   QUEtiapine  75 mg Oral QHS   senna-docusate  1 tablet Oral BID   sodium chloride flush  3 mL Intravenous Q12H   Continuous Infusions:  sodium chloride      ceFAZolin (ANCEF) IV 2 g (01/23/22 0939)   methocarbamol (ROBAXIN) IV 500 mg (01/18/22 1137)     LOS: 13 days    Time spent: 41 minutes spent on chart review, discussion with nursing staff, consultants, updating family and interview/physical exam; more than 50% of that time was spent in counseling and/or coordination of care.    Deniel Mcquiston J [place] DO [facility] Available via Epic secure chat 7am-7pm After these hours, please refer to coverage provider listed on [email or web address] 01/23/2022, 12:21 PM

## 2022-01-24 ENCOUNTER — Other Ambulatory Visit (HOSPITAL_COMMUNITY): Payer: Self-pay

## 2022-01-24 DIAGNOSIS — M4656 Other infective spondylopathies, lumbar region: Secondary | ICD-10-CM | POA: Diagnosis not present

## 2022-01-24 LAB — BASIC METABOLIC PANEL
Anion gap: 9 (ref 5–15)
BUN: 17 mg/dL (ref 6–20)
CO2: 26 mmol/L (ref 22–32)
Calcium: 9.2 mg/dL (ref 8.9–10.3)
Chloride: 104 mmol/L (ref 98–111)
Creatinine, Ser: 0.51 mg/dL (ref 0.44–1.00)
GFR, Estimated: >60 mL/min (ref >60)
Glucose, Bld: 101 mg/dL — ABNORMAL HIGH (ref 70–99)
Potassium: 3.8 mmol/L (ref 3.5–5.1)
Sodium: 139 mmol/L (ref 135–145)

## 2022-01-24 LAB — CBC
HCT: 37.6 % (ref 36.0–46.0)
Hemoglobin: 12.2 g/dL (ref 12.0–15.0)
MCH: 25.8 pg — ABNORMAL LOW (ref 26.0–34.0)
MCHC: 32.4 g/dL (ref 30.0–36.0)
MCV: 79.5 fL — ABNORMAL LOW (ref 80.0–100.0)
Platelets: 528 10*3/uL — ABNORMAL HIGH (ref 150–400)
RBC: 4.73 MIL/uL (ref 3.87–5.11)
RDW: 13.5 % (ref 11.5–15.5)
WBC: 9.4 10*3/uL (ref 4.0–10.5)
nRBC: 0 % (ref 0.0–0.2)

## 2022-01-24 LAB — C-REACTIVE PROTEIN: CRP: 1.4 mg/dL — ABNORMAL HIGH (ref <1.0)

## 2022-01-24 LAB — SEDIMENTATION RATE: Sed Rate: 24 mm/hr — ABNORMAL HIGH (ref 0–22)

## 2022-01-24 MED ORDER — BUPRENORPHINE HCL-NALOXONE HCL 8-2 MG SL FILM
8.0000 mg | ORAL_FILM | Freq: Two times a day (BID) | SUBLINGUAL | 0 refills | Status: AC
  Filled 2022-01-24: qty 4, 2d supply, fill #0

## 2022-01-24 MED ORDER — ORITAVANCIN DIPHOSPHATE 400 MG IV SOLR
1200.0000 mg | Freq: Once | INTRAVENOUS | Status: AC
  Administered 2022-01-24: 1200 mg via INTRAVENOUS
  Filled 2022-01-24: qty 120

## 2022-01-24 NOTE — Progress Notes (Signed)
Nsg Discharge Note  Admit Date:  01/10/2022 Discharge date: 01/24/2022   Almira Coaster to be D/C'd Home per MD order.  AVS completed. Patient/caregiver able to verbalize understanding.  Discharge Medication: Allergies as of 01/24/2022   No Known Allergies      Medication List     TAKE these medications    cefadroxil 500 MG capsule Commonly known as: DURICEF Take 2 capsules (1,000 mg total) by mouth 2 (two) times daily for 28 days.   Suboxone 8-2 MG Film Generic drug: Buprenorphine HCl-Naloxone HCl Place 1 Film under the tongue 2 (two) times daily for 2 days. Start taking on: January 25, 2022        Discharge Assessment: Vitals:   01/23/22 1942 01/24/22 0800  BP: 125/71 117/78  Pulse: (!) 108 (!) 103  Resp: 18   Temp: 97.7 F (36.5 C) 97.7 F (36.5 C)  SpO2: 100% 100%   Skin clean, dry and intact without evidence of skin break down, no evidence of skin tears noted. IV catheter discontinued intact. Site without signs and symptoms of complications - no redness or edema noted at insertion site, patient denies c/o pain - only slight tenderness at site.  Dressing with slight pressure applied.  D/c Instructions-Education: Discharge instructions given to patient/family with verbalized understanding. D/c education completed with patient/family including follow up instructions, medication list, d/c activities limitations if indicated, with other d/c instructions as indicated by MD - patient able to verbalize understanding, all questions fully answered. Patient instructed to return to ED, call 911, or call MD for any changes in condition.  Patient escorted via WC, and D/C home via private auto.  Kizzie Bane, RN 01/24/2022 2:32 PM

## 2022-01-24 NOTE — Progress Notes (Signed)
[facility]  Date of Admission:  01/10/2022     Total days of antibiotics 14  Cefazolin           ASSESSMENT: Krystal Valdez has done well with initial 2 weeks of IV therapy since clearing blood cultures. Her pain is under good control and has a plan for Subutex clinic at discharge in place. She is very motivated to remain off of drugs and feels she has good support to achieve this.   She is in agreement of discharge to continue outpatient treatment for septic arthritis / myositis @ L2-3 with small 10x15 mm collection at the L3 articular process. One dose IV oritavancin today @ 4 pm arranged. D/C home on high dose Cefadroxil BID to continue another 4-6 weeks depending on progress. Will have her back in clinic in 10d to see if we need to consider another dose of IV dalvance or can continue PO throughout treatment. CRP/ESR, BMP and CBC today for monitoring trends.   We talked about other STI screenings today - will check serum RPR to screen syphilis. HIV non reactive. She has Ab against Hep A/B so no further vaccines needed here. Reactive hepatitis C Ab with very low VL at 30 copies. Discussed this is likely a sign she is clearing infection on her own - will have to follow up Hep C RNA in 1 month to assess trend.     PLAN:  1 dose oritavancin today  D/C with high dose Cefadroxil 1 gm BID to continue for treatment.  Hospital FU arranged 6/22 @ 2:15 pm with Terri Piedra, NP  Labs ordered for today.     Principal Problem:   Septic arthritis of lumbar spine (HCC) Active Problems:   Drug abuse and dependence (HCC)   Hematuria   Staphylococcus aureus bacteremia   UTI (urinary tract infection)   Septic arthritis of vertebra (HCC)   Hypokalemia   Microcytic anemia   Hepatitis C virus infection    buprenorphine-naloxone  1 tablet Sublingual BID   enoxaparin (LOVENOX) injection  40 mg Subcutaneous Q24H   feeding supplement  237 mL Oral BID BM   nicotine  21 mg  Transdermal Daily   QUEtiapine  75 mg Oral QHS   senna-docusate  1 tablet Oral BID   sodium chloride flush  3 mL Intravenous Q12H    SUBJECTIVE:  Doing well. Ready to go home. Spent time discussing her previous battles with depression/drug use and plan for Discharge.     No Known Allergies   Review of Systems: Review of Systems  Constitutional:  Negative for chills, fever and weight loss.  Respiratory:  Negative for cough, shortness of breath and wheezing.   Cardiovascular:  Negative for chest pain and leg swelling.  Gastrointestinal:  Negative for abdominal pain, constipation, diarrhea, nausea and vomiting.  Skin:  Negative for rash.      OBJECTIVE: Vitals:   01/22/22 2042 01/23/22 0941 01/23/22 1942 01/24/22 0800  BP: 123/79 138/83 125/71 117/78  Pulse: (!) 111 (!) 116 (!) 108 (!) 103  Resp: 18  18   Temp: 98 F (36.7 C) 98.1 F (36.7 C) 97.7 F (36.5 C) 97.7 F (36.5 C)  TempSrc: Oral Oral Oral Oral  SpO2: 100% 99% 100% 100%  Weight:      Height:       Body mass index is 22.71 kg/m.  Physical Exam Constitutional:      General: She is not in acute distress.  Appearance: She is well-developed.     Comments: Lying in bed; pleasant.   Cardiovascular:     Rate and Rhythm: Normal rate and regular rhythm.     Heart sounds: Normal heart sounds.  Pulmonary:     Effort: Pulmonary effort is normal.     Breath sounds: Normal breath sounds.  Skin:    General: Skin is warm and dry.  Neurological:     Mental Status: She is alert and oriented to person, place, and time.  Psychiatric:        Mood and Affect: Mood normal.     Lab Results Lab Results  Component Value Date   WBC 6.2 01/14/2022   HGB 10.2 (L) 01/14/2022   HCT 30.9 (L) 01/14/2022   MCV 78.2 (L) 01/14/2022   PLT 388 01/14/2022    Lab Results  Component Value Date   CREATININE 0.56 01/14/2022   BUN 9 01/14/2022   NA 138 01/14/2022   K 3.8 01/14/2022   CL 103 01/14/2022   CO2 23 01/14/2022     Lab Results  Component Value Date   ALT 13 01/10/2022   AST 14 (L) 01/10/2022   ALKPHOS 71 01/10/2022   BILITOT 0.5 01/10/2022     Microbiology: No results found for this or any previous visit (from the past 240 hour(s)).    Janene Madeira, MSN, NP-C [facility] [email or web address] Pager: [phone] Office: [phone] [facility] Main Line: [place] Communication Welcome

## 2022-01-24 NOTE — Discharge Summary (Signed)
Physician Discharge Summary  Krystal Valdez QGB:[id number] DOB: 1996/01/16 DOA: 01/10/2022  PCP: Pcp, No  Admit date: 01/10/2022 Discharge date: 01/24/2022  Admitted From: Home Disposition: Home  Recommendations for Outpatient Follow-up:  Follow up with PCP in 1-2 weeks Outpatient follow-up with substance abuse clinic as scheduled on 6/14 Follow-up with infectious disease as scheduled on 02/03/2022 at 2:15pm with Terri Piedra, NP Discharged on cefadroxil 1 g p.o. twice daily Discharged on 2 days of Suboxone all her clinic appointment as above Continue to encourage abstinence from illicit substances  Home Health: None Equipment/Devices: No  Discharge Condition: Stable CODE STATUS: Full code Diet recommendation: Regular diet  History of present illness:  Krystal Valdez is a 26 y.o. female with past medical history significant for current IV drug use, mood disorder who presented to [facility] ED on 5/29 with low back pain.  Patient also with subjective fever/chills, no pain relief at home using Tylenol, Benadryl, heroin and heat therapy.  Also admitted to some nausea.  Pain is radiating from the left lower back to her abdomen.   In the ED, WBC 10.7, ESR 75, CRP 13.2, lactic acid 1.  MR T/L-spine with likely septic arthritis L2-3 with 10 x 15 mm fluid collection.  Patient was started vancomycin and cefepime.  EDP consulted with neurosurgery and did not believe there was any acute need for surgical intervention; and recommended ID consult and possible IR for aspiration.  Hospital service consulted for further evaluation and management of arthritis.  Hospital course:  Septic arthritis L-spine L2-L3 with fluid collection MSSA bacteremia Patient presenting with low back pain with elevated ESR/CRP with septic arthritis at L2-3 with 10 x 15 mm fluid collection on MR T/L-spine.  EDP discussed with neurosurgery, did not believe there is any acute need for surgical intervention.  IR was consulted, area  of fluid collection too small to obtain sample.  TEE negative for endocarditis.  Infectious disease was consulted and followed during hospital course.  Patient completed a 14-day course of IV cefazolin followed by 1 dose of IV oritavancin.  Repeat blood cultures 01/12/2022 with no growth x5 days.  Patient will continue cefadroxil 1 g p.o. twice daily on discharge.  Outpatient follow-up with infectious disease on 02/03/2022.  Continue to encourage illicit drug cessation.   IV drug abuse Patient admits to taking heroin and methamphetamine.  UDS positive for amphetamines, opiates, THC.  Discussed need for complete cessation.  Patient interested in Suboxone therapy after discharge and has appointment scheduled on 01/26/2022 with Lorriane Shire at [facility] in [place]. Seen by TOC. Started Suboxone 6/9.   Hepatitis C HCVRNA detectable at 30 copies. HCV clearing on her own will need HCVRNA in 3 months.  Hep B surface antigen negative hep B antibody less than 0.1 core hep b total antibody nonreactive,hep A antibody reactive.  Outpatient follow-up with infectious disease   Hypokalemia: Resolved Repleted during hospitalization   Microcytic anemia Stable  Discharge Diagnoses:  Principal Problem:   Septic arthritis of lumbar spine ([place]) Active Problems:   Staphylococcus aureus bacteremia   Drug abuse and dependence (HCC)   Hematuria   UTI (urinary tract infection)   Septic arthritis of vertebra (HCC)   Hypokalemia   Microcytic anemia   Hepatitis C virus infection    Discharge Instructions  Discharge Instructions     Call MD for:  difficulty breathing, headache or visual disturbances   Complete by: As directed    Call MD for:  extreme fatigue  Complete by: As directed    Call MD for:  persistant dizziness or light-headedness   Complete by: As directed    Call MD for:  persistant nausea and vomiting   Complete by: As directed    Call MD for:  severe uncontrolled pain   Complete by:  As directed    Call MD for:  temperature >100.4   Complete by: As directed    Diet - low sodium heart healthy   Complete by: As directed    Increase activity slowly   Complete by: As directed       Allergies as of 01/24/2022   No Known Allergies      Medication List     TAKE these medications    Buprenorphine HCl-Naloxone HCl 8-2 MG Film Commonly known as: Suboxone Place 1 Film under the tongue 2 (two) times daily for 2 days. Start taking on: January 25, 2022   cefadroxil 500 MG capsule Commonly known as: DURICEF Take 2 capsules (1,000 mg total) by mouth 2 (two) times daily for 28 days.        Follow-up Information     Sharlyne Cai, [place]. Schedule an appointment as soon as possible for a visit.   Why: Please schedule a follow up appointment in the next 7-10 days after discharge from the hospital. Contact information: [phone]        Golden Circle, FNP Follow up on 02/03/2022.   Specialty: Infectious Diseases Why: 2:15 pm appointment - please arrive 15 min early to register. Call clinic if you need to reschedule [phone] Contact information: [address] [place] [address] [phone]                No Known Allergies  Consultations: Infectious disease Neurosurgery Interventional radiology   Procedures/Studies: ECHO TEE  Result Date: 01/14/2022    TRANSESOPHOGEAL ECHO REPORT   Patient Name:   Krystal Valdez Date of Exam: 01/14/2022 Medical Rec #:  [id number]       Height:       67.0 in Accession #:    [id number]      Weight:       145.0 lb Date of Birth:  08/07/1996      BSA:          1.764 m Patient Age:    25 years        BP:           126/94 mmHg Patient Gender: F               HR:           70 bpm. Exam Location:  Inpatient Procedure: Transesophageal Echo, Color Doppler and Cardiac Doppler Indications:    Bacteremia  History:        Patient has prior history of Echocardiogram examinations, most                 recent  01/11/2022. Risk Factors:IVDU.  Sonographer:    Eartha Inch Referring Phys: [id number] Margie Billet PROCEDURE: After discussion of the risks and benefits of a TEE, an informed consent was obtained from the patient. The transesophogeal probe was passed without difficulty through the esophogus of the patient. Sedation performed by different physician. The patient was monitored while under deep sedation. Anesthestetic sedation was provided intravenously by Anesthesiology: 282.90m of Propofol. Image quality was good. The patient's vital signs; including heart rate, blood pressure, and oxygen saturation; remained stable throughout the procedure. The  patient developed no complications during the procedure. IMPRESSIONS  1. Left ventricular ejection fraction, by estimation, is 60 to 65%. The left ventricle has normal function. The left ventricle has no regional wall motion abnormalities.  2. Right ventricular systolic function is normal. The right ventricular size is normal.  3. No left atrial/left atrial appendage thrombus was detected.  4. The mitral valve is normal in structure. Trivial mitral valve regurgitation. No evidence of mitral stenosis.  5. The aortic valve is tricuspid. Aortic valve regurgitation is not visualized. No aortic stenosis is present.  6. The inferior vena cava is normal in size with greater than 50% respiratory variability, suggesting right atrial pressure of 3 mmHg. Conclusion(s)/Recommendation(s): Normal biventricular function without evidence of hemodynamically significant valvular heart disease. No evidence of vegetation/infective endocarditis on this transesophageael echocardiogram. FINDINGS  Left Ventricle: Left ventricular ejection fraction, by estimation, is 60 to 65%. The left ventricle has normal function. The left ventricle has no regional wall motion abnormalities. The left ventricular internal cavity size was normal in size. There is  no left ventricular hypertrophy. Right  Ventricle: The right ventricular size is normal. No increase in right ventricular wall thickness. Right ventricular systolic function is normal. Left Atrium: Left atrial size was normal in size. No left atrial/left atrial appendage thrombus was detected. Right Atrium: Right atrial size was normal in size. Pericardium: There is no evidence of pericardial effusion. Mitral Valve: The mitral valve is normal in structure. Trivial mitral valve regurgitation. No evidence of mitral valve stenosis. Tricuspid Valve: The tricuspid valve is normal in structure. Tricuspid valve regurgitation is not demonstrated. No evidence of tricuspid stenosis. Aortic Valve: The aortic valve is tricuspid. Aortic valve regurgitation is not visualized. No aortic stenosis is present. Pulmonic Valve: The pulmonic valve was normal in structure. Pulmonic valve regurgitation is not visualized. No evidence of pulmonic stenosis. Aorta: The aortic root is normal in size and structure. Venous: The inferior vena cava is normal in size with greater than 50% respiratory variability, suggesting right atrial pressure of 3 mmHg. IAS/Shunts: No atrial level shunt detected by color flow Doppler. Skeet Latch MD Electronically signed by Skeet Latch MD Signature Date/Time: 01/14/2022/1:42:41 PM    Final    ECHOCARDIOGRAM COMPLETE  Result Date: 01/11/2022    ECHOCARDIOGRAM REPORT   Patient Name:   Krystal Valdez Date of Exam: 01/11/2022 Medical Rec #:  [id number]       Height:       67.0 in Accession #:    [id number]      Weight:       145.0 lb Date of Birth:  Dec 21, 1995      BSA:          1.764 m Patient Age:    25 years        BP:           125/71 mmHg Patient Gender: F               HR:           91 bpm. Exam Location:  Inpatient Procedure: 2D Echo, Cardiac Doppler and Color Doppler Indications:    Bacteremia  History:        Patient has no prior history of Echocardiogram examinations.  Sonographer:    Jyl Heinz Referring Phys: [place]  1. Left ventricular ejection fraction, by estimation, is 60 to 65%. The left ventricle has normal function. The left ventricle has no regional wall motion abnormalities. Left  ventricular diastolic parameters were normal.  2. Right ventricular systolic function is normal. The right ventricular size is normal.  3. The mitral valve is normal in structure. Mild mitral valve regurgitation.  4. The aortic valve is normal in structure. Aortic valve regurgitation is not visualized.  5. The inferior vena cava is normal in size with greater than 50% respiratory variability, suggesting right atrial pressure of 3 mmHg. FINDINGS  Left Ventricle: Left ventricular ejection fraction, by estimation, is 60 to 65%. The left ventricle has normal function. The left ventricle has no regional wall motion abnormalities. The left ventricular internal cavity size was normal in size. There is  no left ventricular hypertrophy. Left ventricular diastolic parameters were normal. Right Ventricle: The right ventricular size is normal. Right vetricular wall thickness was not assessed. Right ventricular systolic function is normal. Left Atrium: Left atrial size was normal in size. Right Atrium: Right atrial size was normal in size. Pericardium: There is no evidence of pericardial effusion. Mitral Valve: The mitral valve is normal in structure. Mild mitral valve regurgitation. Tricuspid Valve: The tricuspid valve is normal in structure. Tricuspid valve regurgitation is trivial. Aortic Valve: The aortic valve is normal in structure. Aortic valve regurgitation is not visualized. Aortic valve peak gradient measures 7.7 mmHg. Pulmonic Valve: The pulmonic valve was not well visualized. Pulmonic valve regurgitation is mild. No evidence of pulmonic stenosis. Aorta: The aortic root and ascending aorta are structurally normal, with no evidence of dilitation. Venous: The inferior vena cava is normal in size with greater than 50%  respiratory variability, suggesting right atrial pressure of 3 mmHg. IAS/Shunts: No atrial level shunt detected by color flow Doppler.  LEFT VENTRICLE PLAX 2D LVIDd:         4.50 cm      Diastology LVIDs:         3.30 cm      LV e' medial:    9.68 cm/s LV PW:         1.00 cm      LV E/e' medial:  10.7 LV IVS:        0.80 cm      LV e' lateral:   11.60 cm/s LVOT diam:     2.00 cm      LV E/e' lateral: 9.0 LV SV:         72 LV SV Index:   41 LVOT Area:     3.14 cm  LV Volumes (MOD) LV vol d, MOD A2C: 109.0 ml LV vol d, MOD A4C: 102.0 ml LV vol s, MOD A2C: 37.3 ml LV vol s, MOD A4C: 36.8 ml LV SV MOD A2C:     71.7 ml LV SV MOD A4C:     102.0 ml LV SV MOD BP:      69.5 ml RIGHT VENTRICLE             IVC RV Basal diam:  2.80 cm     IVC diam: 1.90 cm RV Mid diam:    2.80 cm RV S prime:     15.40 cm/s TAPSE (M-mode): 2.4 cm LEFT ATRIUM             Index        RIGHT ATRIUM          Index LA diam:        3.00 cm 1.70 cm/m   RA Area:     9.61 cm LA Vol (A2C):   30.4 ml 17.23 ml/m  RA Volume:  19.00 ml 10.77 ml/m LA Vol (A4C):   44.0 ml 24.94 ml/m LA Biplane Vol: 38.0 ml 21.54 ml/m  AORTIC VALVE AV Area (Vmax): 2.85 cm AV Vmax:        139.00 cm/s AV Peak Grad:   7.7 mmHg LVOT Vmax:      126.00 cm/s LVOT Vmean:     83.500 cm/s LVOT VTI:       0.229 m  AORTA Ao Root diam: 2.90 cm Ao Asc diam:  2.40 cm MITRAL VALVE MV Area (PHT): 5.75 cm     SHUNTS MV Decel Time: 132 msec     Systemic VTI:  0.23 m MV E velocity: 104.00 cm/s  Systemic Diam: 2.00 cm MV A velocity: 104.00 cm/s MV E/A ratio:  1.00 Dorris Carnes MD Electronically signed by Dorris Carnes MD Signature Date/Time: 01/11/2022/12:07:39 PM    Final    MR THORACIC SPINE W WO CONTRAST  Result Date: 01/10/2022 CLINICAL DATA:  Lower back pain with infection suspected. IV drug abuse EXAM: MRI THORACIC AND LUMBAR SPINE WITHOUT AND WITH CONTRAST TECHNIQUE: Multiplanar and multiecho pulse sequences of the thoracic and lumbar spine were obtained without and with intravenous  contrast. CONTRAST:  57m GADAVIST GADOBUTROL 1 MMOL/ML IV SOLN COMPARISON:  None Available. FINDINGS: MRI THORACIC SPINE FINDINGS Alignment:  Physiologic. Vertebrae: No fracture, evidence of discitis, or bone lesion. Cord:  Normal signal and morphology. Paraspinal and other soft tissues: Negative. Disc levels: Diffusely preserved disc height and hydration. No herniation or impingement MRI LUMBAR SPINE FINDINGS Segmentation: Counting sequence on thoracic spine study is nondiagnostic due to motion. Based on the upper most ribs there are 6 lumbar type vertebrae, the lowest numbered S1. Alignment:  Normal Vertebrae: Mild marrow edema around the left L2-3 facet with extensive regional muscular T2 hyperintensity and enhancement. On postcontrast imaging there is collection posterior to the left L3 articular process measuring 10 x 15 mm. Conus medullaris: Extends to the L2-3 level and appears normal. Paraspinal and other soft tissues: Left-sided myositis and periarticular collection as noted above. Disc levels: No degenerative changes or impingement IMPRESSION: Thoracic spine: Negative. Lumbar spine: 1. When numbered from above there is a transitional S1 vertebra. 2. Left-sided intrinsic back muscles myositis with 10 x 15 mm collection posterior to the left L3 articular process. Suspect septic arthritis at L2-3. Electronically Signed   By: JJorje GuildM.D.   On: 01/10/2022 11:28   MR Lumbar Spine W Wo Contrast  Result Date: 01/10/2022 CLINICAL DATA:  Lower back pain with infection suspected. IV drug abuse EXAM: MRI THORACIC AND LUMBAR SPINE WITHOUT AND WITH CONTRAST TECHNIQUE: Multiplanar and multiecho pulse sequences of the thoracic and lumbar spine were obtained without and with intravenous contrast. CONTRAST:  754mGADAVIST GADOBUTROL 1 MMOL/ML IV SOLN COMPARISON:  None Available. FINDINGS: MRI THORACIC SPINE FINDINGS Alignment:  Physiologic. Vertebrae: No fracture, evidence of discitis, or bone lesion. Cord:   Normal signal and morphology. Paraspinal and other soft tissues: Negative. Disc levels: Diffusely preserved disc height and hydration. No herniation or impingement MRI LUMBAR SPINE FINDINGS Segmentation: Counting sequence on thoracic spine study is nondiagnostic due to motion. Based on the upper most ribs there are 6 lumbar type vertebrae, the lowest numbered S1. Alignment:  Normal Vertebrae: Mild marrow edema around the left L2-3 facet with extensive regional muscular T2 hyperintensity and enhancement. On postcontrast imaging there is collection posterior to the left L3 articular process measuring 10 x 15 mm. Conus medullaris: Extends to the L2-3 level and appears  normal. Paraspinal and other soft tissues: Left-sided myositis and periarticular collection as noted above. Disc levels: No degenerative changes or impingement IMPRESSION: Thoracic spine: Negative. Lumbar spine: 1. When numbered from above there is a transitional S1 vertebra. 2. Left-sided intrinsic back muscles myositis with 10 x 15 mm collection posterior to the left L3 articular process. Suspect septic arthritis at L2-3. Electronically Signed   By: Jorje Guild M.D.   On: 01/10/2022 11:28   CT Renal Stone Study  Result Date: 01/10/2022 CLINICAL DATA:  26 year old female presenting with left-sided abdominal and flank pain. EXAM: CT ABDOMEN AND PELVIS WITHOUT CONTRAST TECHNIQUE: Multidetector CT imaging of the abdomen and pelvis was performed following the standard protocol without IV contrast. RADIATION DOSE REDUCTION: This exam was performed according to the departmental dose-optimization program which includes automated exposure control, adjustment of the mA and/or kV according to patient size and/or use of iterative reconstruction technique. COMPARISON:  No priors. FINDINGS: Lower chest: Unremarkable. Hepatobiliary: Area of low attenuation adjacent to the falciform ligament within the liver, likely focal fatty infiltration. No other definite  suspicious cystic or solid hepatic lesions are confidently identified on today's noncontrast CT examination. Unenhanced appearance of the gallbladder is normal. Pancreas: No definite pancreatic mass or peripancreatic fluid collections or inflammatory changes are noted on today's noncontrast CT examination. Spleen: Spleen is enlarged measuring 13.6 x 6.5 x 14.6 cm (estimated splenic volume of 645 mL) . Adrenals/Urinary Tract: There are no abnormal calcifications within the collecting system of either kidney, along the course of either ureter, or within the lumen of the urinary bladder. No hydroureteronephrosis or perinephric stranding to suggest urinary tract obstruction at this time. The unenhanced appearance of the kidneys is unremarkable bilaterally. Unenhanced appearance of the urinary bladder is normal. Bilateral adrenal glands are normal in appearance. Stomach/Bowel: Unenhanced appearance of the stomach is normal. No pathologic dilatation of small bowel or colon. The appendix is not confidently identified and may be surgically absent. Regardless, there are no inflammatory changes noted adjacent to the cecum to suggest the presence of an acute appendicitis at this time. Vascular/Lymphatic: No atherosclerotic calcifications are noted in the abdominal aorta or pelvic vasculature. No lymphadenopathy noted in the abdomen or pelvis. Reproductive: Unenhanced appearance of the uterus and ovaries is unremarkable. Other: No significant volume of ascites.  No pneumoperitoneum. Musculoskeletal: There are no aggressive appearing lytic or blastic lesions noted in the visualized portions of the skeleton. IMPRESSION: 1. No acute findings are noted in the abdomen or pelvis to account for the patient's symptoms. 2. Splenomegaly. Electronically Signed   By: Vinnie Langton M.D.   On: 01/10/2022 06:34     Subjective: Patient seen examined bedside, resting comfortably.  No specific complaints this morning.  Ready for discharge  home after her last IV antibiotic dose this evening.  Continue discuss complete cessation of illicit substance, she agrees.  She has a follow-up appointment scheduled on 01/26/2022 and her substance abuse clinic.  No other questions or concerns at this time.  Denies headache, no fever/chills/night sweats, no nausea/vomiting/diarrhea, no chest pain, no palpitations, no shortness of breath, no abdominal pain, no focal weakness, no fatigue, no paresthesias.  No acute events overnight per nursing staff.  Discharge Exam: Vitals:   01/23/22 1942 01/24/22 0800  BP: 125/71 117/78  Pulse: (!) 108 (!) 103  Resp: 18   Temp: 97.7 F (36.5 C) 97.7 F (36.5 C)  SpO2: 100% 100%   Vitals:   01/22/22 2042 01/23/22 0941 01/23/22 1942 01/24/22 0800  BP: 123/79 138/83 125/71 117/78  Pulse: (!) 111 (!) 116 (!) 108 (!) 103  Resp: 18  18   Temp: 98 F (36.7 C) 98.1 F (36.7 C) 97.7 F (36.5 C) 97.7 F (36.5 C)  TempSrc: Oral Oral Oral Oral  SpO2: 100% 99% 100% 100%  Weight:      Height:        Physical Exam: GEN: NAD, alert and oriented x 3, wd/wn HEENT: NCAT, PERRL, EOMI, sclera clear, MMM PULM: CTAB w/o wheezes/crackles, normal respiratory effort in room air CV: RRR w/o M/G/R GI: abd soft, NTND, NABS, no R/G/M MSK: no peripheral edema, muscle strength globally intact 5/5 bilateral upper/lower extremities NEURO: CN II-XII intact, no focal deficits, sensation to light touch intact PSYCH: normal mood/affect Integumentary: dry/intact, no rashes or wounds    The results of significant diagnostics from this hospitalization (including imaging, microbiology, ancillary and laboratory) are listed below for reference.     Microbiology: No results found for this or any previous visit (from the past 240 hour(s)).   Labs: BNP (last 3 results) No results for input(s): "BNP" in the last 8760 hours. Basic Metabolic Panel: No results for input(s): "NA", "K", "CL", "CO2", "GLUCOSE", "BUN", "CREATININE",  "CALCIUM", "MG", "PHOS" in the last 168 hours. Liver Function Tests: No results for input(s): "AST", "ALT", "ALKPHOS", "BILITOT", "PROT", "ALBUMIN" in the last 168 hours. No results for input(s): "LIPASE", "AMYLASE" in the last 168 hours. No results for input(s): "AMMONIA" in the last 168 hours. CBC: No results for input(s): "WBC", "NEUTROABS", "HGB", "HCT", "MCV", "PLT" in the last 168 hours. Cardiac Enzymes: No results for input(s): "CKTOTAL", "CKMB", "CKMBINDEX", "TROPONINI" in the last 168 hours. BNP: Invalid input(s): "POCBNP" CBG: No results for input(s): "GLUCAP" in the last 168 hours. D-Dimer No results for input(s): "DDIMER" in the last 72 hours. Hgb A1c No results for input(s): "HGBA1C" in the last 72 hours. Lipid Profile No results for input(s): "CHOL", "HDL", "LDLCALC", "TRIG", "CHOLHDL", "LDLDIRECT" in the last 72 hours. Thyroid function studies No results for input(s): "TSH", "T4TOTAL", "T3FREE", "THYROIDAB" in the last 72 hours.  Invalid input(s): "FREET3" Anemia work up No results for input(s): "VITAMINB12", "FOLATE", "FERRITIN", "TIBC", "IRON", "RETICCTPCT" in the last 72 hours. Urinalysis    Component Value Date/Time   COLORURINE YELLOW 01/10/2022 0640   APPEARANCEUR HAZY (A) 01/10/2022 0640   LABSPEC 1.014 01/10/2022 0640   PHURINE 6.0 01/10/2022 0640   GLUCOSEU NEGATIVE 01/10/2022 0640   HGBUR LARGE (A) 01/10/2022 0640   BILIRUBINUR NEGATIVE 01/10/2022 0640   BILIRUBINUR negative 06/29/2016 2020   BILIRUBINUR neg 06/12/2013 1517   KETONESUR NEGATIVE 01/10/2022 0640   PROTEINUR 30 (A) 01/10/2022 0640   UROBILINOGEN 0.2 06/29/2016 2020   UROBILINOGEN 0.2 04/06/2014 2140   NITRITE POSITIVE (A) 01/10/2022 0640   LEUKOCYTESUR TRACE (A) 01/10/2022 0640   Sepsis Labs No results for input(s): "WBC" in the last 168 hours.  Invalid input(s): "PROCALCITONIN", "LACTICIDVEN" Microbiology No results found for this or any previous visit (from the past 240  hour(s)).   Time coordinating discharge: Over 30 minutes  SIGNED:   Donnamarie Poag [place] DO  [facility] 01/24/2022, 10:54 AM

## 2022-01-24 NOTE — TOC Transition Note (Signed)
Transition of Care [facility]) - CM/SW Discharge Note   Patient Details  Name: Krystal Valdez MRN: [id number] Date of Birth: 1996-08-05  Transition of Care [facility]) CM/SW Contact:  Curlene Labrum, RN Phone Number: 01/24/2022, 9:31 AM   Clinical Narrative:    CM met with the patient to discuss transitions of care to home today.  Follow up PCP and Outpatient drug treatment center noted in the discharge instructions.  The patient will receive last dose of IV antibiotics this afternoon at 4 pm.  The patient will be transported home by her father this evening at 6 pm by private vehicle to home.  CM and MSW with DTP Team will continue to follow the patient for discharge planning to home today.   Final next level of care: Home/Self Care Barriers to Discharge: Continued Medical Work up   Patient Goals and CMS Choice Patient states their goals for this hospitalization and ongoing recovery are:: Patient needs IV antibiotics through 6/12 - plans to discharge home with family. CMS Medicare.gov Compare Post Acute Care list provided to:: Patient Choice offered to / list presented to : Patient  Discharge Placement                       Discharge Plan and Services   Discharge Planning Services: CM Consult                                 Social Determinants of Health (SDOH) Interventions     Readmission Risk Interventions    01/21/2022   10:00 AM  Readmission Risk Prevention Plan  Post Dischage Appt Complete  Medication Screening Complete  Transportation Screening Complete

## 2022-01-24 NOTE — TOC Benefit Eligibility Note (Signed)
Patient Scientific laboratory technician completed.     The patient is currently admitted and upon discharge could be taking Oritavancin 1200 mg.   The current 30 day co-pay is, $4.   The patient is insured through [facility] MEDICAID.

## 2022-01-25 LAB — RPR: RPR Ser Ql: NONREACTIVE

## 2022-02-02 DIAGNOSIS — F1110 Opioid abuse, uncomplicated: Secondary | ICD-10-CM | POA: Insufficient documentation

## 2022-02-02 DIAGNOSIS — F1920 Other psychoactive substance dependence, uncomplicated: Secondary | ICD-10-CM | POA: Insufficient documentation

## 2022-02-03 ENCOUNTER — Inpatient Hospital Stay: Payer: Medicaid Other | Admitting: Family

## 2023-06-08 ENCOUNTER — Emergency Department (HOSPITAL_COMMUNITY)
Admission: EM | Admit: 2023-06-08 | Discharge: 2023-06-08 | Disposition: A | Discharge status: Home / Self Care | Payer: MEDICAID | Attending: Emergency Medicine | Admitting: Emergency Medicine

## 2023-06-08 ENCOUNTER — Emergency Department (HOSPITAL_COMMUNITY): Payer: MEDICAID

## 2023-06-08 ENCOUNTER — Other Ambulatory Visit: Payer: Self-pay

## 2023-06-08 ENCOUNTER — Encounter (HOSPITAL_COMMUNITY): Payer: Self-pay | Admitting: *Deleted

## 2023-06-08 DIAGNOSIS — S80212A Abrasion, left knee, initial encounter: Secondary | ICD-10-CM | POA: Insufficient documentation

## 2023-06-08 DIAGNOSIS — Y92410 Unspecified street and highway as the place of occurrence of the external cause: Secondary | ICD-10-CM | POA: Diagnosis not present

## 2023-06-08 DIAGNOSIS — Z23 Encounter for immunization: Secondary | ICD-10-CM | POA: Diagnosis not present

## 2023-06-08 DIAGNOSIS — S0093XA Contusion of unspecified part of head, initial encounter: Secondary | ICD-10-CM

## 2023-06-08 DIAGNOSIS — S0993XA Unspecified injury of face, initial encounter: Secondary | ICD-10-CM | POA: Diagnosis present

## 2023-06-08 DIAGNOSIS — M795 Residual foreign body in soft tissue: Secondary | ICD-10-CM

## 2023-06-08 DIAGNOSIS — S81842A Puncture wound with foreign body, left lower leg, initial encounter: Secondary | ICD-10-CM | POA: Diagnosis not present

## 2023-06-08 DIAGNOSIS — T07XXXA Unspecified multiple injuries, initial encounter: Secondary | ICD-10-CM

## 2023-06-08 DIAGNOSIS — S0181XA Laceration without foreign body of other part of head, initial encounter: Secondary | ICD-10-CM | POA: Diagnosis not present

## 2023-06-08 MED ORDER — CEPHALEXIN 500 MG PO CAPS: 500.0000 mg | ORAL_CAPSULE | Freq: Four times a day (QID) | ORAL | 0 refills | Status: DC

## 2023-06-08 MED ORDER — ACETAMINOPHEN 500 MG PO TABS
1000.0000 mg | ORAL_TABLET | Freq: Once | ORAL | Status: AC
Start: 2023-06-08 — End: 2023-06-08
  Administered 2023-06-08: 1000 mg via ORAL
  Filled 2023-06-08: qty 2

## 2023-06-08 MED ORDER — TETANUS-DIPHTH-ACELL PERTUSSIS 5-2.5-18.5 LF-MCG/0.5 IM SUSY
0.5000 mL | PREFILLED_SYRINGE | Freq: Once | INTRAMUSCULAR | Status: AC
  Administered 2023-06-08: 0.5 mL via INTRAMUSCULAR
  Filled 2023-06-08: qty 0.5

## 2023-06-08 MED ORDER — TRAMADOL HCL 50 MG PO TABS
50.0000 mg | ORAL_TABLET | Freq: Once | ORAL | Status: AC
  Administered 2023-06-08: 50 mg via ORAL
  Filled 2023-06-08: qty 1

## 2023-06-08 MED ORDER — LIDOCAINE-EPINEPHRINE (PF) 2 %-1:200000 IJ SOLN
20.0000 mL | Freq: Once | INTRAMUSCULAR | Status: AC
  Administered 2023-06-08: 20 mL
  Filled 2023-06-08: qty 20

## 2023-06-08 NOTE — ED Notes (Signed)
Patient transported to CT 

## 2023-06-08 NOTE — Discharge Instructions (Addendum)
It was our pleasure to provide your ER care today - we hope that you feel better.  As we discussed, it does appear there are residual foreign bodies (small pieces of glass) in your wounds. Keep wound areas very clean/dry. May shower, pad area gently dry. Take antibiotic as prescribed.   Take acetaminophen or ibuprofen as need.   Follow up with primary care doctor, or urgent care, for suture removal in one week.   Return to ER if worse, new/severe pain, infection of wounds (spreading redness, pus, etc.), or other concern.   You were given pain medication in the ER - no driving for the next 6 hours.
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
## 2023-08-16 NOTE — L&D Delivery Note (Signed)
   Delivery Note:   [id number] at [redacted]w[redacted]d  Admitting diagnosis: Pregnancy [Z34.90] Risks:  Patient Active Problem List   Diagnosis Date Noted   Pregnancy 03/21/2024   History of opioid abuse (HCC) Mar 14, 2024   Death of child 03-14-24   Integrated prenatal screen positive for trisomy 21 01/18/2024   Abnormal fetal ultrasound 01/15/2024   Fetal growth restriction antepartum 01/13/2024   Supervision of high risk pregnancy, antepartum 01/03/2024   Pregnancy complicated by Suboxone  maintenance, antepartum (HCC) 01/03/2024   Late prenatal care 01/03/2024   History of VBAC 12/19/2023   Other psychoactive substance dependence, uncomplicated (HCC) 02/02/2022   Hepatitis C virus infection 01/14/2022   UTI (urinary tract infection) during pregnancy, second trimester 01/11/2022   Patient reports that contractions started yesterday. She reprots that her water  broke at 0310 am clear fluid. At that time contractions had been 7 min apart. She reports infant delivered at 0320 am. Upon arrival to MAU, infant taken to NICU d/t cyanosis and was receiving blow-bi on the EMS truck.   Delivery of a Live born newborn  Birth Weight:  PENDING  APGAR: Unknown   Newborn Delivery   Birth date/time: 03/21/24 0320 Delivery type: VBAC      Third Stage:  In MAU patient given IV fentanyl  and IM pitocin  for pain, bleeding and discomfort. With Gentle cord traction and maternal pushing efforts Placenta delivered-Spontaneous intact with 3 vessels . Uterine tone firm  bleeding minimal  Uterotonics: IM pit given  Placenta to L&D for disposal.  None laceration identified.  Episiotomy:None Local analgesia: N/A  Repair:N/a Est. Blood Loss (mL):200.00  Complications: None   Mom to [facility] following recovery.  Baby boy to NICU.  Delivery Report:   Review the Delivery Report for details.    Kinser Fellman Erven) Emilio, MSN, CNM  [facility]  03/21/24 6:30 AM

## 2023-12-19 ENCOUNTER — Encounter: Payer: Self-pay | Admitting: Adult Health

## 2023-12-19 ENCOUNTER — Ambulatory Visit (INDEPENDENT_AMBULATORY_CARE_PROVIDER_SITE_OTHER): Payer: MEDICAID | Admitting: Adult Health

## 2023-12-19 ENCOUNTER — Other Ambulatory Visit (HOSPITAL_COMMUNITY)
Admission: RE | Admit: 2023-12-19 | Discharge: 2023-12-19 | Disposition: A | Discharge status: Home / Self Care | Payer: MEDICAID | Source: Ambulatory Visit | Attending: Adult Health | Admitting: Adult Health

## 2023-12-19 VITALS — BP 116/71 | HR 91 | Ht 67.0 in | Wt 143.5 lb

## 2023-12-19 DIAGNOSIS — F1911 Other psychoactive substance abuse, in remission: Secondary | ICD-10-CM | POA: Insufficient documentation

## 2023-12-19 DIAGNOSIS — Z124 Encounter for screening for malignant neoplasm of cervix: Secondary | ICD-10-CM | POA: Insufficient documentation

## 2023-12-19 DIAGNOSIS — R300 Dysuria: Secondary | ICD-10-CM

## 2023-12-19 DIAGNOSIS — N898 Other specified noninflammatory disorders of vagina: Secondary | ICD-10-CM | POA: Diagnosis not present

## 2023-12-19 DIAGNOSIS — O26892 Other specified pregnancy related conditions, second trimester: Secondary | ICD-10-CM | POA: Diagnosis not present

## 2023-12-19 DIAGNOSIS — Z3201 Encounter for pregnancy test, result positive: Secondary | ICD-10-CM

## 2023-12-19 DIAGNOSIS — Z3A00 Weeks of gestation of pregnancy not specified: Secondary | ICD-10-CM | POA: Diagnosis not present

## 2023-12-19 DIAGNOSIS — Z3490 Encounter for supervision of normal pregnancy, unspecified, unspecified trimester: Secondary | ICD-10-CM | POA: Insufficient documentation

## 2023-12-19 DIAGNOSIS — Z98891 History of uterine scar from previous surgery: Secondary | ICD-10-CM | POA: Insufficient documentation

## 2023-12-19 DIAGNOSIS — N390 Urinary tract infection, site not specified: Secondary | ICD-10-CM | POA: Diagnosis not present

## 2023-12-19 DIAGNOSIS — Z363 Encounter for antenatal screening for malformations: Secondary | ICD-10-CM

## 2023-12-19 DIAGNOSIS — Z1332 Encounter for screening for maternal depression: Secondary | ICD-10-CM | POA: Diagnosis not present

## 2023-12-19 LAB — POCT URINALYSIS DIPSTICK OB
Blood, UA: NEGATIVE
Glucose, UA: NEGATIVE
Ketones, UA: NEGATIVE
Nitrite, UA: POSITIVE
POC,PROTEIN,UA: NEGATIVE

## 2023-12-19 LAB — POCT URINE PREGNANCY: Preg Test, Ur: POSITIVE — AB

## 2023-12-19 MED ORDER — PRENATAL PLUS 27-1 MG PO TABS: 1.0000 | ORAL_TABLET | Freq: Every day | ORAL | 12 refills | Status: AC

## 2023-12-19 MED ORDER — CEPHALEXIN 500 MG PO CAPS
500.0000 mg | ORAL_CAPSULE | Freq: Three times a day (TID) | ORAL | 0 refills | Status: DC
Start: 2023-12-19 — End: 2024-01-03

## 2023-12-19 NOTE — Progress Notes (Signed)
 Subjective:     Patient ID: Krystal Valdez, female   DOB: 1995-11-08, 28 y.o.   MRN: [id number]  HPI Krystal Valdez is a 28 year old white female, with SO, [id number] in for confirmatory UPT, has missed periods and had 3+HPTs, not really sure when last period was. Had burning with urination for 2-3 days and vaginal discharge with itching.  PCP is Hamilton Levin FNP  Review of Systems  +missed periods and had 3+HPTs, not really sure when last period was.  + burning with urination for 2-3 days + vaginal discharge with itching.   Reviewed past medical,surgical, social and family history. Reviewed medications and allergies.  Objective:   Physical Exam BP 116/71 (BP Location: Right Arm, Patient Position: Sitting, Cuff Size: Normal)   Pulse 91   Ht 5\' 7"  (1.702 m)   Wt 143 lb 8 oz (65.1 kg)   LMP 09/30/2023 (Approximate)   Breastfeeding No   BMI 22.48 kg/m  UPT + about 11+3 weeks by ?LMP with EDD about 07/06/24. Urine dipstick +leuks and nitrates.  Skin warm and dry. Neck: mid line trachea, normal thyroid, good ROM, no lymphadenopathy noted. Lungs: clear to ausculation bilaterally. Cardiovascular: regular rate and rhythm.    Pelvic: external genitalia is normal in appearance no lesions, vagina: white discharge without odor,urethra has no lesions or masses noted, cervix:smooth and bulbous pap with GC/CHL and HR HPV genotyping performed,and CV swab performed, uterus: about 22-24 week size, FH 21 CM and FHR 128, adnexa: no masses or tenderness noted. Bladder is non tender and no masses felt.  AA is 0 Fall risk is low    12/19/2023    2:47 PM  Depression screen PHQ 2/9  Decreased Interest 0  Down, Depressed, Hopeless 0  PHQ - 2 Score 0  Altered sleeping 0  Tired, decreased energy 1  Change in appetite 0  Feeling bad or failure about yourself  1  Trouble concentrating 0  Moving slowly or fidgety/restless 0  Suicidal thoughts 0  PHQ-9 Score 2       12/19/2023    2:47 PM  GAD 7 : Generalized  Anxiety Score  Nervous, Anxious, on Edge 1  Control/stop worrying 0  Worry too much - different things 1  Trouble relaxing 0  Restless 0  Easily annoyed or irritable 0  Afraid - awful might happen 0  Total GAD 7 Score 2      Upstream - 12/19/23 1444       Pregnancy Intention Screening   Does the patient want to become pregnant in the next year? N/A    Does the patient's partner want to become pregnant in the next year? N/A    Would the patient like to discuss contraceptive options today? N/A      Contraception Wrap Up   Current Method Pregnant/Seeking Pregnancy    End Method Pregnant/Seeking Pregnancy    Contraception Counseling Provided No            Examination chaperoned by Alphonso Aschoff LPN   Assessment:     1. Pregnancy examination or test, positive result (Primary) - POCT urine pregnancy  2. Burning with urination UA C&S sent  - POC Urinalysis Dipstick OB - Urine Culture - Urinalysis, Routine w reflex microscopic  3. Vaginal discharge during pregnancy in second trimester CV swab sent for Trich,BV and yeast  - Cervicovaginal ancillary only( [place])  4. Pregnancy, unspecified gestational age Rx PNV  5. Urinary tract infection without hematuria, site unspecified Rx  keflex  500 mg 1 tid Meds ordered this encounter  Medications   cephALEXin  (KEFLEX ) 500 MG capsule    Sig: Take 1 capsule (500 mg total) by mouth 3 (three) times daily.    Dispense:  21 capsule    Refill:  0    Supervising Provider:   Evalyn Hillier H [[id number]]   prenatal vitamin w/FE, FA (PRENATAL 1 + 1) 27-1 MG TABS tablet    Sig: Take 1 tablet by mouth daily at 12 noon.    Dispense:  30 tablet    Refill:  12    Supervising Provider:   Evalyn Hillier H [[id number]]     6. Routine Papanicolaou smear Pap sent  - Cytology - PAP( [place])  7. History of VBAC  8. History of substance abuse (HCC) She is on Suboxone    9. Antenatal screening for malformation using ultrasonics Anatomy US   ASAP - US  OB DETAIL + 14 WK; Future     Plan:     Review OB packet

## 2023-12-20 LAB — URINALYSIS, ROUTINE W REFLEX MICROSCOPIC
Bilirubin, UA: NEGATIVE
Glucose, UA: NEGATIVE
Ketones, UA: NEGATIVE
Nitrite, UA: NEGATIVE
Protein,UA: NEGATIVE
RBC, UA: NEGATIVE
Specific Gravity, UA: 1.009 (ref 1.005–1.030)
Urobilinogen, Ur: 0.2 mg/dL (ref 0.2–1.0)
pH, UA: 6.5 (ref 5.0–7.5)

## 2023-12-20 LAB — MICROSCOPIC EXAMINATION
Casts: NONE SEEN /LPF
RBC, Urine: NONE SEEN /HPF (ref 0–2)

## 2023-12-21 LAB — CERVICOVAGINAL ANCILLARY ONLY
Candida Glabrata: POSITIVE — AB
Candida Vaginitis: POSITIVE — AB
Comment: NEGATIVE
Comment: NEGATIVE
Comment: NEGATIVE
Trichomonas: NEGATIVE

## 2023-12-23 LAB — URINE CULTURE

## 2023-12-25 LAB — CYTOLOGY - PAP
Chlamydia: NEGATIVE
Comment: NEGATIVE
Comment: NEGATIVE
Comment: NORMAL
Diagnosis: NEGATIVE
High risk HPV: NEGATIVE
Neisseria Gonorrhea: NEGATIVE

## 2024-01-03 ENCOUNTER — Ambulatory Visit: Payer: MEDICAID

## 2024-01-03 ENCOUNTER — Ambulatory Visit: Payer: MEDICAID | Admitting: Advanced Practice Midwife

## 2024-01-03 ENCOUNTER — Encounter: Payer: MEDICAID | Admitting: *Deleted

## 2024-01-03 ENCOUNTER — Encounter: Payer: Self-pay | Admitting: Advanced Practice Midwife

## 2024-01-03 VITALS — BP 115/71 | HR 77 | Wt 142.0 lb

## 2024-01-03 DIAGNOSIS — Z3480 Encounter for supervision of other normal pregnancy, unspecified trimester: Secondary | ICD-10-CM | POA: Insufficient documentation

## 2024-01-03 DIAGNOSIS — Z3A26 26 weeks gestation of pregnancy: Secondary | ICD-10-CM

## 2024-01-03 DIAGNOSIS — Z363 Encounter for antenatal screening for malformations: Secondary | ICD-10-CM

## 2024-01-03 DIAGNOSIS — Z131 Encounter for screening for diabetes mellitus: Secondary | ICD-10-CM | POA: Diagnosis not present

## 2024-01-03 DIAGNOSIS — O0932 Supervision of pregnancy with insufficient antenatal care, second trimester: Secondary | ICD-10-CM | POA: Diagnosis not present

## 2024-01-03 DIAGNOSIS — B1920 Unspecified viral hepatitis C without hepatic coma: Secondary | ICD-10-CM

## 2024-01-03 DIAGNOSIS — Z98891 History of uterine scar from previous surgery: Secondary | ICD-10-CM

## 2024-01-03 DIAGNOSIS — O0930 Supervision of pregnancy with insufficient antenatal care, unspecified trimester: Secondary | ICD-10-CM | POA: Insufficient documentation

## 2024-01-03 DIAGNOSIS — O0990 Supervision of high risk pregnancy, unspecified, unspecified trimester: Secondary | ICD-10-CM | POA: Insufficient documentation

## 2024-01-03 DIAGNOSIS — F1190 Opioid use, unspecified, uncomplicated: Secondary | ICD-10-CM

## 2024-01-03 DIAGNOSIS — F1120 Opioid dependence, uncomplicated: Secondary | ICD-10-CM | POA: Insufficient documentation

## 2024-01-03 DIAGNOSIS — O402XX0 Polyhydramnios, second trimester, not applicable or unspecified: Secondary | ICD-10-CM

## 2024-01-03 NOTE — Progress Notes (Addendum)
 US  26+1 wks,breech,anterior placenta gr 3,polyhydramnios,AFI 25 cm,CX 3.5 cm,intra abdominal umbilical vein varix 1.2 cm,dilated lateral ventricles,left ventricle 1.2 cm,right ventricle 1.3 cm,EFW 845 g EDD 04/09/2024 by today's ultrasound,unknown LMP,BPD 28+3 wks,HC 28+4,AC 26+6,FL 23 wks,short extremity's,unable to visualize spine because of fetal position

## 2024-01-03 NOTE — Progress Notes (Signed)
 INITIAL OBSTETRICAL VISIT Patient name: Krystal Valdez MRN [id number]  Date of birth: 02/27/1996 Chief Complaint:   Initial Prenatal Visit  History of Present Illness:   Krystal Valdez is a 28 y.o. [id number] Caucasian female at [redacted]w[redacted]d by US  at 26.1 weeks (today) with an Estimated Date of Delivery: 04/09/24 being seen today for her initial obstetrical visit.   No LMP recorded (lmp unknown). Patient is pregnant. Her obstetrical history is significant for 2013/02/05 vag del (child died age 65 from RSV); vag 2016, 2018 C/S for fetal intolerance; 02-06-2023 VBAC (all term); only has custody of the youngest.   Today she reports no complaints.  Last pap May 2025. Results were: NILM w/ HRHPV negative     01/03/2024    9:56 AM 12/19/2023    2:47 PM  Depression screen PHQ 2/9  Decreased Interest 0 0  Down, Depressed, Hopeless 0 0  PHQ - 2 Score 0 0  Altered sleeping 0 0  Tired, decreased energy 1 1  Change in appetite 0 0  Feeling bad or failure about yourself  0 1  Trouble concentrating 0 0  Moving slowly or fidgety/restless 0 0  Suicidal thoughts 0 0  PHQ-9 Score 1 2        01/03/2024    9:56 AM 12/19/2023    2:47 PM  GAD 7 : Generalized Anxiety Score  Nervous, Anxious, on Edge 1 1  Control/stop worrying 0 0  Worry too much - different things 1 1  Trouble relaxing 0 0  Restless 0 0  Easily annoyed or irritable 0 0  Afraid - awful might happen 0 0  Total GAD 7 Score 2 2     Review of Systems:   Pertinent items are noted in HPI Denies cramping/contractions, leakage of fluid, vaginal bleeding, abnormal vaginal discharge w/ itching/odor/irritation, headaches, visual changes, shortness of breath, chest pain, abdominal pain, severe nausea/vomiting, or problems with urination or bowel movements unless otherwise stated above.  Pertinent History Reviewed:  Reviewed past medical,surgical, social, obstetrical and family history.  Reviewed problem list, medications and allergies. OB History  Gravida  Para Term Preterm AB Living  6 4 4  1 3   SAB IAB Ectopic Multiple Live Births      4    # Outcome Date GA Lbr Len/2nd Weight Sex Type Anes PTL Lv  6 Current           5 Term 10/27/22 [redacted]w[redacted]d  7 lb 6 oz (3.345 kg) F VBAC EPI  LIV  4 AB 2020          3 Term 05/07/17 [redacted]w[redacted]d  5 lb (2.268 kg) F CS-LTranv EPI N LIV     Complications: Fetal Intolerance  2 Term 10/11/14 [redacted]w[redacted]d  9 lb 8 oz (4.309 kg) M Vag-Spont EPI N LIV  1 Term 03/14/13 [redacted]w[redacted]d 22:30 / 00:10  F Vag-Spont EPI  DEC   Physical Assessment:   Vitals:   01/03/24 1003  BP: 115/71  Pulse: 77  Weight: 142 lb (64.4 kg)  Body mass index is 22.24 kg/m.       Physical Examination:  General appearance - well appearing, and in no distress  Mental status - alert, oriented to person, place, and time  Psych:  She has a normal mood and affect  Skin - warm and dry, normal color, no suspicious lesions noted  Chest - effort normal, all lung fields clear to auscultation bilaterally  Heart - normal rate and regular rhythm  Abdomen - soft, nontender  Extremities:  No swelling or varicosities noted  Pelvic - not indicated  Thin prep pap is not done   TODAY'S ANATOMY U/S: US  26+1 wks,breech,anterior placenta gr 3,polyhydramnios,AFI 25 cm,CX 3.5 cm,left intra abdominal umbilical vein varix 1.2 cm,dilated lateral ventricles,left ventricle 1.2 cm,right ventricle 1.3 cm,EFW 845 g EDD 04/09/2024 by today's ultrasound,unknown LMP,BPD 28+3 wks,HC 28+4,AC 26+6,FL 23 wks,short extremity's,unable to visualize spine because of fetal position   No results found for this or any previous visit (from the past 24 hours).  Assessment & Plan:  1) High-Risk Pregnancy [id number] at [redacted]w[redacted]d with an Estimated Date of Delivery: 04/09/24   2) Initial OB visit  3) Late to care @ 26wks  4) Concerns on u/s, short extremities, mild poly (25cm), dilated lat ventricles (1.2cm and 1.3cm), intra-abd UVV> referred to MFM for further eval (5/28 @ 0900)  5) OUD, has been stable on  Suboxone  bid x 63yrs; prescriber is Hamilton Levin ([place] FNP); NAS referral ordered  6) Recent UTI 5/6, TOC today  7) Vag del x 2, C/S for fetal intol, VBAC, wants VBAC again  8) Hx Hep C, will check LFTs today  9) Hx first child died at age 46 from RSV; doesn't have custody of kids #2 and #3  Meds: No orders of the defined types were placed in this encounter.   Initial labs obtained Continue prenatal vitamins Reviewed n/v relief measures and warning s/s to report Reviewed recommended weight gain based on pre-gravid BMI Encouraged well-balanced diet Genetic & carrier screening discussed: requests Panorama and Horizon , too late for NT/IT Ultrasound discussed; fetal survey: requested CCNC completed> form faxed if has or is planning to apply for medicaid The nature of [facility] [employer] with multiple MDs and other Advanced Practice Providers was explained to patient; also emphasized that fellows, residents, and students are part of our team. Does have home bp cuff. Office bp cuff given: no. Rx sent: n/a. Check bp weekly, let us  know if consistently >140/90.   No indications for ASA therapy (per uptodate)  Follow-up: Return for first avail MFM referral; first avail PN1/PN2; 2wk HROB (MDs), Sign BTL consent today.   Orders Placed This Encounter  Procedures   Urine Culture   US  MFM OB DETAIL +14 WK   HORIZON CUSTOM   PANORAMA PRENATAL TEST   CBC/D/Plt+RPR+Rh+ABO+RubIgG...   Glucose Tolerance, 2 Hours w/1 Hour   Comprehensive metabolic panel with GFR   Amb referral to Neonatal Abstinence Syndrome (NAS) Team    Jolayne Natter CNM 01/03/2024 12:51 PM

## 2024-01-03 NOTE — Patient Instructions (Signed)
 Krystal Valdez, thank you for choosing our office today! We appreciate the opportunity to meet your healthcare needs. You may receive a short survey by mail, e-mail, or through [employer]. If you are happy with your care we would appreciate if you could take just a few minutes to complete the survey questions. We read all of your comments and take your feedback very seriously. Thank you again for choosing our office.  [facility] for [employer] [facility] at [facility]  [facility] ([address] [place], [place] [address]) Entrance C, located off of [address] Free 24/7 valet parking   Nausea & Vomiting Have saltine crackers or pretzels by your bed and eat a few bites before you raise your head out of bed in the morning Eat small frequent meals throughout the day instead of large meals Drink plenty of fluids throughout the day to stay hydrated, just don't drink a lot of fluids with your meals.  This can make your stomach fill up faster making you feel sick Do not brush your teeth right after you eat Products with real ginger are good for nausea, like ginger ale and ginger hard candy Make sure it says made with real ginger! Sucking on sour candy like lemon heads is also good for nausea If your prenatal vitamins make you nauseated, take them at night so you will sleep through the nausea Sea Bands If you feel like you need medicine for the nausea & vomiting please let us know If you are unable to keep any fluids or food down please let us know   Constipation Drink plenty of fluid, preferably water, throughout the day Eat foods high in fiber such as fruits, vegetables, and grains Exercise, such as walking, is a good way to keep your bowels regular Drink warm fluids, especially warm prune juice, or decaf coffee Eat a 1/2 cup of real oatmeal (not instant), 1/2 cup applesauce, and 1/2-1 cup warm prune juice every day If needed, you may take Colace (docusate sodium) stool softener  once or twice a day to help keep the stool soft.  If you still are having problems with constipation, you may take Miralax once daily as needed to help keep your bowels regular.   Home Blood Pressure Monitoring for Patients   Your provider has recommended that you check your blood pressure (BP) at least once a week at home. If you do not have a blood pressure cuff at home, one will be provided for you. Contact your provider if you have not received your monitor within 1 week.   Helpful Tips for Accurate Home Blood Pressure Checks  Don't smoke, exercise, or drink caffeine 30 minutes before checking your BP Use the restroom before checking your BP (a full bladder can raise your pressure) Relax in a comfortable upright chair Feet on the ground Left arm resting comfortably on a flat surface at the level of your heart Legs uncrossed Back supported Sit quietly and don't talk Place the cuff on your bare arm Adjust snuggly, so that only two fingertips can fit between your skin and the top of the cuff Check 2 readings separated by at least one minute Keep a log of your BP readings For a visual, please reference this diagram: http://ccnc.care/bpdiagram  Provider Name: [facility]     Phone: [phone]  Zone 1: ALL CLEAR  Continue to monitor your symptoms:  BP reading is less than 140 (top number) or less than 90 (bottom  number)  No right upper stomach pain No headaches or seeing spots No feeling nauseated or throwing up No swelling in face and hands  Zone 2: CAUTION Call your doctor's office for any of the following:  BP reading is greater than 140 (top number) or greater than 90 (bottom number)  Stomach pain under your ribs in the middle or right side Headaches or seeing spots Feeling nauseated or throwing up Swelling in face and hands  Zone 3: EMERGENCY  Seek immediate medical care if you have any of the following:  BP reading is greater than160 (top number) or greater than  110 (bottom number) Severe headaches not improving with Tylenol Serious difficulty catching your breath Any worsening symptoms from Zone 2    First Trimester of Pregnancy The first trimester of pregnancy is from week 1 until the end of week 12 (months 1 through 3). A week after a sperm fertilizes an egg, the egg will implant on the wall of the uterus. This embryo will begin to develop into a baby. Genes from you and your partner are forming the baby. The female genes determine whether the baby is a boy or a girl. At 6-8 weeks, the eyes and face are formed, and the heartbeat can be seen on ultrasound. At the end of 12 weeks, all the baby's organs are formed.  Now that you are pregnant, you will want to do everything you can to have a healthy baby. Two of the most important things are to get good prenatal care and to follow your health care provider's instructions. Prenatal care is all the medical care you receive before the baby's birth. This care will help prevent, find, and treat any problems during the pregnancy and childbirth. BODY CHANGES Your body goes through many changes during pregnancy. The changes vary from woman to woman.  You may gain or lose a couple of pounds at first. You may feel sick to your stomach (nauseous) and throw up (vomit). If the vomiting is uncontrollable, call your health care provider. You may tire easily. You may develop headaches that can be relieved by medicines approved by your health care provider. You may urinate more often. Painful urination may mean you have a bladder infection. You may develop heartburn as a result of your pregnancy. You may develop constipation because certain hormones are causing the muscles that push waste through your intestines to slow down. You may develop hemorrhoids or swollen, bulging veins (varicose veins). Your breasts may begin to grow larger and become tender. Your nipples may stick out more, and the tissue that surrounds them  (areola) may become darker. Your gums may bleed and may be sensitive to brushing and flossing. Dark spots or blotches (chloasma, mask of pregnancy) may develop on your face. This will likely fade after the baby is born. Your menstrual periods will stop. You may have a loss of appetite. You may develop cravings for certain kinds of food. You may have changes in your emotions from day to day, such as being excited to be pregnant or being concerned that something may go wrong with the pregnancy and baby. You may have more vivid and strange dreams. You may have changes in your hair. These can include thickening of your hair, rapid growth, and changes in texture. Some women also have hair loss during or after pregnancy, or hair that feels dry or thin. Your hair will most likely return to normal after your baby is born. WHAT TO EXPECT AT YOUR PRENATAL  VISITS During a routine prenatal visit: You will be weighed to make sure you and the baby are growing normally. Your blood pressure will be taken. Your abdomen will be measured to track your baby's growth. The fetal heartbeat will be listened to starting around week 10 or 12 of your pregnancy. Test results from any previous visits will be discussed. Your health care provider may ask you: How you are feeling. If you are feeling the baby move. If you have had any abnormal symptoms, such as leaking fluid, bleeding, severe headaches, or abdominal cramping. If you have any questions. Other tests that may be performed during your first trimester include: Blood tests to find your blood type and to check for the presence of any previous infections. They will also be used to check for low iron levels (anemia) and Rh antibodies. Later in the pregnancy, blood tests for diabetes will be done along with other tests if problems develop. Urine tests to check for infections, diabetes, or protein in the urine. An ultrasound to confirm the proper growth and development  of the baby. An amniocentesis to check for possible genetic problems. Fetal screens for spina bifida and Down syndrome. You may need other tests to make sure you and the baby are doing well. HOME CARE INSTRUCTIONS  Medicines Follow your health care provider's instructions regarding medicine use. Specific medicines may be either safe or unsafe to take during pregnancy. Take your prenatal vitamins as directed. If you develop constipation, try taking a stool softener if your health care provider approves. Diet Eat regular, well-balanced meals. Choose a variety of foods, such as meat or vegetable-based protein, fish, milk and low-fat dairy products, vegetables, fruits, and whole grain breads and cereals. Your health care provider will help you determine the amount of weight gain that is right for you. Avoid raw meat and uncooked cheese. These carry germs that can cause birth defects in the baby. Eating four or five small meals rather than three large meals a day may help relieve nausea and vomiting. If you start to feel nauseous, eating a few soda crackers can be helpful. Drinking liquids between meals instead of during meals also seems to help nausea and vomiting. If you develop constipation, eat more high-fiber foods, such as fresh vegetables or fruit and whole grains. Drink enough fluids to keep your urine clear or pale yellow. Activity and Exercise Exercise only as directed by your health care provider. Exercising will help you: Control your weight. Stay in shape. Be prepared for labor and delivery. Experiencing pain or cramping in the lower abdomen or low back is a good sign that you should stop exercising. Check with your health care provider before continuing normal exercises. Try to avoid standing for long periods of time. Move your legs often if you must stand in one place for a long time. Avoid heavy lifting. Wear low-heeled shoes, and practice good posture. You may continue to have sex  unless your health care provider directs you otherwise. Relief of Pain or Discomfort Wear a good support bra for breast tenderness.   Take warm sitz baths to soothe any pain or discomfort caused by hemorrhoids. Use hemorrhoid cream if your health care provider approves.   Rest with your legs elevated if you have leg cramps or low back pain. If you develop varicose veins in your legs, wear support hose. Elevate your feet for 15 minutes, 3-4 times a day. Limit salt in your diet. Prenatal Care Schedule your prenatal visits by the  twelfth week of pregnancy. They are usually scheduled monthly at first, then more often in the last 2 months before delivery. Write down your questions. Take them to your prenatal visits. Keep all your prenatal visits as directed by your health care provider. Safety Wear your seat belt at all times when driving. Make a list of emergency phone numbers, including numbers for family, friends, the hospital, and police and fire departments. General Tips Ask your health care provider for a referral to a local prenatal education class. Begin classes no later than at the beginning of month 6 of your pregnancy. Ask for help if you have counseling or nutritional needs during pregnancy. Your health care provider can offer advice or refer you to specialists for help with various needs. Do not use hot tubs, steam rooms, or saunas. Do not douche or use tampons or scented sanitary pads. Do not cross your legs for long periods of time. Avoid cat litter boxes and soil used by cats. These carry germs that can cause birth defects in the baby and possibly loss of the fetus by miscarriage or stillbirth. Avoid all smoking, herbs, alcohol, and medicines not prescribed by your health care provider. Chemicals in these affect the formation and growth of the baby. Schedule a dentist appointment. At home, brush your teeth with a soft toothbrush and be gentle when you floss. SEEK MEDICAL CARE IF:   You have dizziness. You have mild pelvic cramps, pelvic pressure, or nagging pain in the abdominal area. You have persistent nausea, vomiting, or diarrhea. You have a bad smelling vaginal discharge. You have pain with urination. You notice increased swelling in your face, hands, legs, or ankles. SEEK IMMEDIATE MEDICAL CARE IF:  You have a fever. You are leaking fluid from your vagina. You have spotting or bleeding from your vagina. You have severe abdominal cramping or pain. You have rapid weight gain or loss. You vomit blood or material that looks like coffee grounds. You are exposed to [place] measles and have never had them. You are exposed to fifth disease or chickenpox. You develop a severe headache. You have shortness of breath. You have any kind of trauma, such as from a fall or a car accident. Document Released: 07/26/2001 Document Revised: 12/16/2013 Document Reviewed: 06/11/2013 [facility] Patient Information 2015 [place], [place]. This information is not intended to replace advice given to you by your health care provider. Make sure you discuss any questions you have with your health care provider.

## 2024-01-05 ENCOUNTER — Other Ambulatory Visit: Payer: MEDICAID

## 2024-01-05 LAB — URINE CULTURE

## 2024-01-09 ENCOUNTER — Ambulatory Visit: Payer: Self-pay | Admitting: Advanced Practice Midwife

## 2024-01-09 ENCOUNTER — Other Ambulatory Visit: Payer: MEDICAID

## 2024-01-09 ENCOUNTER — Ambulatory Visit: Payer: Self-pay | Admitting: Adult Health

## 2024-01-10 ENCOUNTER — Other Ambulatory Visit: Payer: Self-pay | Admitting: *Deleted

## 2024-01-10 ENCOUNTER — Ambulatory Visit (HOSPITAL_BASED_OUTPATIENT_CLINIC_OR_DEPARTMENT_OTHER): Payer: MEDICAID

## 2024-01-10 ENCOUNTER — Encounter: Payer: Self-pay | Admitting: Advanced Practice Midwife

## 2024-01-10 ENCOUNTER — Ambulatory Visit: Payer: MEDICAID | Attending: Obstetrics and Gynecology

## 2024-01-10 VITALS — BP 123/58 | HR 103

## 2024-01-10 DIAGNOSIS — O99322 Drug use complicating pregnancy, second trimester: Secondary | ICD-10-CM | POA: Diagnosis present

## 2024-01-10 DIAGNOSIS — O358XX0 Maternal care for other (suspected) fetal abnormality and damage, not applicable or unspecified: Secondary | ICD-10-CM | POA: Diagnosis not present

## 2024-01-10 DIAGNOSIS — O402XX0 Polyhydramnios, second trimester, not applicable or unspecified: Secondary | ICD-10-CM

## 2024-01-10 DIAGNOSIS — O3509X0 Maternal care for (suspected) other central nervous system malformation or damage in fetus, not applicable or unspecified: Secondary | ICD-10-CM

## 2024-01-10 DIAGNOSIS — O34219 Maternal care for unspecified type scar from previous cesarean delivery: Secondary | ICD-10-CM

## 2024-01-10 DIAGNOSIS — Z3A27 27 weeks gestation of pregnancy: Secondary | ICD-10-CM | POA: Diagnosis not present

## 2024-01-10 DIAGNOSIS — O0930 Supervision of pregnancy with insufficient antenatal care, unspecified trimester: Secondary | ICD-10-CM | POA: Diagnosis present

## 2024-01-10 DIAGNOSIS — F1120 Opioid dependence, uncomplicated: Secondary | ICD-10-CM

## 2024-01-10 DIAGNOSIS — O3506X0 Maternal care for (suspected) central nervous system malformation or damage in fetus, hydrocephaly, not applicable or unspecified: Secondary | ICD-10-CM

## 2024-01-10 DIAGNOSIS — F1190 Opioid use, unspecified, uncomplicated: Secondary | ICD-10-CM

## 2024-01-10 LAB — COMPREHENSIVE METABOLIC PANEL WITH GFR
ALT: 33 IU/L — ABNORMAL HIGH (ref 0–32)
AST: 30 IU/L (ref 0–40)
Albumin: 3.7 g/dL — ABNORMAL LOW (ref 4.0–5.0)
Alkaline Phosphatase: 149 IU/L — ABNORMAL HIGH (ref 44–121)
BUN/Creatinine Ratio: 15 (ref 9–23)
BUN: 8 mg/dL (ref 6–20)
Bilirubin Total: 0.3 mg/dL (ref 0.0–1.2)
CO2: 20 mmol/L (ref 20–29)
Calcium: 8.9 mg/dL (ref 8.7–10.2)
Chloride: 102 mmol/L (ref 96–106)
Creatinine, Ser: 0.54 mg/dL — ABNORMAL LOW (ref 0.57–1.00)
Globulin, Total: 2.9 g/dL (ref 1.5–4.5)
Glucose: 79 mg/dL (ref 70–99)
Potassium: 4.3 mmol/L (ref 3.5–5.2)
Sodium: 138 mmol/L (ref 134–144)
Total Protein: 6.6 g/dL (ref 6.0–8.5)
eGFR: 128 mL/min/{1.73_m2} (ref >59)

## 2024-01-11 LAB — CBC/D/PLT+RPR+RH+ABO+RUBIGG...
Antibody Screen: NEGATIVE
Basophils Absolute: 0.1 10*3/uL (ref 0.0–0.2)
Basos: 1 %
EOS (ABSOLUTE): 0.1 10*3/uL (ref 0.0–0.4)
Eos: 1 %
HCV Ab: REACTIVE — AB
HIV Screen 4th Generation wRfx: NONREACTIVE
Hematocrit: 40.1 % (ref 34.0–46.6)
Hemoglobin: 13.5 g/dL (ref 11.1–15.9)
Hepatitis B Surface Ag: NEGATIVE
Immature Grans (Abs): 0 10*3/uL (ref 0.0–0.1)
Immature Granulocytes: 0 %
Lymphocytes Absolute: 1.9 10*3/uL (ref 0.7–3.1)
Lymphs: 23 %
MCH: 30.2 pg (ref 26.6–33.0)
MCHC: 33.7 g/dL (ref 31.5–35.7)
MCV: 90 fL (ref 79–97)
Monocytes Absolute: 0.5 10*3/uL (ref 0.1–0.9)
Monocytes: 6 %
Neutrophils Absolute: 5.7 10*3/uL (ref 1.4–7.0)
Neutrophils: 69 %
Platelets: 227 10*3/uL (ref 150–450)
RBC: 4.47 x10E6/uL (ref 3.77–5.28)
RDW: 13.2 % (ref 11.7–15.4)
RPR Ser Ql: NONREACTIVE
Rh Factor: POSITIVE
Rubella Antibodies, IGG: 8.19 {index} (ref >0.99)
WBC: 8.3 10*3/uL (ref 3.4–10.8)

## 2024-01-11 LAB — GLUCOSE TOLERANCE, 2 HOURS W/ 1HR
Glucose, 1 hour: 82 mg/dL (ref 70–179)
Glucose, 2 hour: 75 mg/dL (ref 70–152)
Glucose, Fasting: 73 mg/dL (ref 70–91)

## 2024-01-11 LAB — HCV RT-PCR, QUANT (NON-GRAPH)
HCV log10: 5.699 {Log_IU}/mL
Hepatitis C Quantitation: 500000 [IU]/mL

## 2024-01-11 NOTE — Progress Notes (Signed)
 MFM Consult Note  Krystal Valdez is currently at 27 weeks and 1 day.  She was seen as ventriculomegaly was noted in the fetal brain and a umbilical cord varix were noted on a recent ultrasound performed in your office.    The patient reports that she did not realize that she was pregnant until recently.    Her EDC of April 09, 2024 was based on the recent ultrasound performed in your office.    The patient has a significant history of drug abuse.  She reports that she started using multiple drugs following the death of her first child.    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  She has chronic hepatitis C.    She has screened negative for gestational diabetes in her current pregnancy.    She reports that she just had a cell free DNA test drawn yesterday.  The overall EFW obtained today of 1 pound 15 ounces measures at the 9th percentile for her gestational age.   Polyhydramnios with a total AFI of 27.34 cm was noted today.  A mature appearing anterior placenta is present.  Doppler studies of the umbilical arteries performed today shows normal forward flow.  There were no signs of absent or reversed end-diastolic flow.  Bilateral ventriculomegaly measuring 1.2 to 1.3 cm dilated was noted in the fetal brain.  The cavum septum pellucidum was also unable to be visualized today.    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  The patient was advised that the prognosis for children born with agenesis of the corpus callosum vary widely from asymptomatic with normal intellectual capacities to children with neurodevelopmental delays, motor defects, and seizure disorders.  The prognosis cannot be determined until after the child is born.  We will consider a prenatal consultation with pediatric neurology/neurosurgery to discuss the  prognosis and management after birth later in her pregnancy.  Chromosomal abnormalities (especially trisomy 76 and 13) are present in 20% of cases of agenesis of the corpus callosum.  The increased risk of genetic syndromes that cannot be diagnosed via prenatal ultrasounds associated with agenesis of corpus callosum was also discussed.  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  She was advised that an umbilical vein varix may be seen in normal fetuses.  However an umbilical vein varix has also been associated with fetal chromosomal abnormalities, cardiac defects, chromosome abnormalities, and IUGR.  Due to ventriculomegaly, possible agenesis of the corpus callosum, and the umbilical vein varix, the patient was offered and declined an amniocentesis today for definitive prenatal diagnosis of fetal chromosomal abnormalities.  She will wait the results of her cell free DNA test.  The views of the fetal anatomy were limited today due to her advanced gestational age and the fetal position.  Due to the multiple abnormalities noted today, she was referred to [facility] pediatric cardiology for a fetal echocardiogram.  She will return in 2 weeks for another ultrasound exam to assess the umbilical artery Doppler studies and to complete the views of the fetal anatomy.    The patient stated that all of her questions were answered today.  A total of 60 minutes was spent counseling and coordinating the care for this patient.  Greater than 50% of the time was spent in direct face-to-face contact.

## 2024-01-13 ENCOUNTER — Encounter: Payer: Self-pay | Admitting: Advanced Practice Midwife

## 2024-01-13 DIAGNOSIS — O365990 Maternal care for other known or suspected poor fetal growth, unspecified trimester, not applicable or unspecified: Secondary | ICD-10-CM | POA: Insufficient documentation

## 2024-01-15 DIAGNOSIS — O283 Abnormal ultrasonic finding on antenatal screening of mother: Secondary | ICD-10-CM | POA: Insufficient documentation

## 2024-01-15 LAB — PANORAMA PRENATAL TEST FULL PANEL:PANORAMA TEST PLUS 5 ADDITIONAL MICRODELETIONS
FETAL FRACTION: 17.9
REPORT SUMMARY: HIGH — AB
TRISOMY 21 RESULT TEXT: HIGH — AB

## 2024-01-17 ENCOUNTER — Encounter: Payer: MEDICAID | Admitting: Obstetrics & Gynecology

## 2024-01-17 LAB — HORIZON CUSTOM

## 2024-01-18 ENCOUNTER — Encounter: Payer: Self-pay | Admitting: Advanced Practice Midwife

## 2024-01-18 DIAGNOSIS — O285 Abnormal chromosomal and genetic finding on antenatal screening of mother: Secondary | ICD-10-CM | POA: Insufficient documentation

## 2024-01-18 NOTE — Progress Notes (Signed)
 Attempted to call and notify but was unable to leave a voicemail because the box was full.

## 2024-01-25 ENCOUNTER — Ambulatory Visit

## 2024-01-25 ENCOUNTER — Other Ambulatory Visit: Payer: MEDICAID

## 2024-01-25 DIAGNOSIS — O0930 Supervision of pregnancy with insufficient antenatal care, unspecified trimester: Secondary | ICD-10-CM

## 2024-01-25 DIAGNOSIS — O365990 Maternal care for other known or suspected poor fetal growth, unspecified trimester, not applicable or unspecified: Secondary | ICD-10-CM

## 2024-01-25 DIAGNOSIS — Z3480 Encounter for supervision of other normal pregnancy, unspecified trimester: Secondary | ICD-10-CM

## 2024-02-01 ENCOUNTER — Ambulatory Visit (HOSPITAL_BASED_OUTPATIENT_CLINIC_OR_DEPARTMENT_OTHER): Payer: MEDICAID

## 2024-02-01 ENCOUNTER — Ambulatory Visit: Payer: MEDICAID | Admitting: Obstetrics and Gynecology

## 2024-02-01 ENCOUNTER — Ambulatory Visit: Payer: MEDICAID | Attending: Obstetrics

## 2024-02-01 ENCOUNTER — Other Ambulatory Visit: Payer: Self-pay | Admitting: *Deleted

## 2024-02-01 VITALS — BP 135/59 | HR 77

## 2024-02-01 DIAGNOSIS — Q7190 Unspecified reduction defect of unspecified upper limb: Secondary | ICD-10-CM | POA: Insufficient documentation

## 2024-02-01 DIAGNOSIS — O0930 Supervision of pregnancy with insufficient antenatal care, unspecified trimester: Secondary | ICD-10-CM

## 2024-02-01 DIAGNOSIS — Z3A30 30 weeks gestation of pregnancy: Secondary | ICD-10-CM

## 2024-02-01 DIAGNOSIS — O365930 Maternal care for other known or suspected poor fetal growth, third trimester, not applicable or unspecified: Secondary | ICD-10-CM

## 2024-02-01 DIAGNOSIS — Z368A Encounter for antenatal screening for other genetic defects: Secondary | ICD-10-CM | POA: Diagnosis not present

## 2024-02-01 DIAGNOSIS — O99320 Drug use complicating pregnancy, unspecified trimester: Secondary | ICD-10-CM

## 2024-02-01 DIAGNOSIS — B1920 Unspecified viral hepatitis C without hepatic coma: Secondary | ICD-10-CM

## 2024-02-01 DIAGNOSIS — O365990 Maternal care for other known or suspected poor fetal growth, unspecified trimester, not applicable or unspecified: Secondary | ICD-10-CM

## 2024-02-01 DIAGNOSIS — O34219 Maternal care for unspecified type scar from previous cesarean delivery: Secondary | ICD-10-CM | POA: Insufficient documentation

## 2024-02-01 DIAGNOSIS — O403XX0 Polyhydramnios, third trimester, not applicable or unspecified: Secondary | ICD-10-CM | POA: Diagnosis not present

## 2024-02-01 DIAGNOSIS — B182 Chronic viral hepatitis C: Secondary | ICD-10-CM | POA: Insufficient documentation

## 2024-02-01 DIAGNOSIS — F1920 Other psychoactive substance dependence, uncomplicated: Secondary | ICD-10-CM

## 2024-02-01 DIAGNOSIS — F1120 Opioid dependence, uncomplicated: Secondary | ICD-10-CM

## 2024-02-01 DIAGNOSIS — O0933 Supervision of pregnancy with insufficient antenatal care, third trimester: Secondary | ICD-10-CM

## 2024-02-01 DIAGNOSIS — O285 Abnormal chromosomal and genetic finding on antenatal screening of mother: Secondary | ICD-10-CM | POA: Insufficient documentation

## 2024-02-01 DIAGNOSIS — O3509X0 Maternal care for (suspected) other central nervous system malformation or damage in fetus, not applicable or unspecified: Secondary | ICD-10-CM | POA: Diagnosis present

## 2024-02-01 DIAGNOSIS — Z36 Encounter for antenatal screening for chromosomal anomalies: Secondary | ICD-10-CM | POA: Diagnosis not present

## 2024-02-01 DIAGNOSIS — O98419 Viral hepatitis complicating pregnancy, unspecified trimester: Secondary | ICD-10-CM | POA: Insufficient documentation

## 2024-02-01 DIAGNOSIS — O358XX0 Maternal care for other (suspected) fetal abnormality and damage, not applicable or unspecified: Secondary | ICD-10-CM | POA: Diagnosis not present

## 2024-02-01 DIAGNOSIS — O98413 Viral hepatitis complicating pregnancy, third trimester: Secondary | ICD-10-CM

## 2024-02-01 DIAGNOSIS — O99323 Drug use complicating pregnancy, third trimester: Secondary | ICD-10-CM | POA: Diagnosis not present

## 2024-02-01 DIAGNOSIS — O283 Abnormal ultrasonic finding on antenatal screening of mother: Secondary | ICD-10-CM

## 2024-02-01 DIAGNOSIS — O99353 Diseases of the nervous system complicating pregnancy, third trimester: Secondary | ICD-10-CM | POA: Insufficient documentation

## 2024-02-01 DIAGNOSIS — G9389 Other specified disorders of brain: Secondary | ICD-10-CM | POA: Diagnosis not present

## 2024-02-01 DIAGNOSIS — Z3480 Encounter for supervision of other normal pregnancy, unspecified trimester: Secondary | ICD-10-CM | POA: Insufficient documentation

## 2024-02-01 DIAGNOSIS — Q7290 Unspecified reduction defect of unspecified lower limb: Secondary | ICD-10-CM | POA: Insufficient documentation

## 2024-02-01 NOTE — Progress Notes (Signed)
 Maternal-Fetal Medicine Consultation Name: Krystal Valdez MRN: [id number]  G6 P4013 at 30w 2d gestation.  Patient is here for fetal growth assessment.  Her high risk problems include: - Increased risk for fetal trisomy 21 on cell-free fetal DNA screening.  Patient had opted not have amniocentesis.  She has an appointment for fetal echocardiography later this month.  Abnormal finding seen at previous ultrasound include short long bones, ventriculomegaly, umbilical vein varix, absent CSP and polyhydramnios.  Patient was counseled on the possibility of agenesis of corpus callosum. - Fetal growth restriction. - Previous cesarean delivery followed by VBAC. - Hepatitis C infection.  Recent viral RNA levels where 500,000 copies IU/mL (HCV log10 5.66).  Ultrasound On today's ultrasound, the estimated fetal weight is at the 1st percentile and the abdominal circumference measurement at the 11th percentile.  Femur and humeral measurements like gestational age by 5 weeks.  All long bones otherwise appear normal with no evidence of bowing or fractures. Bilateral ventriculomegaly are seen (measuring 12 mm).  The CSP is not seen. Umbilical artery Doppler showed normal forward diastolic flow.  NST is reactive.  Increased risk for fetal Down syndrome After her previous ultrasound visit, patient was informed of cell-free fetal DNA screening results.  I explained the significance of cell free fetal DNA screening that has a greater detection rate for Down syndrome.  I explained that only amniocentesis will give a definitive result on the fetal karyotype.  Amniocentesis carries a small risk of preterm delivery (1 and 500 procedures).  Patient was concerned about her hepatitis C infection and perinatal transmission.  I explained that the likelihood of transmission is very low. Patient opted not have amniocentesis.  Severe fetal growth restriction Chromosomally abnormal fetuses have a higher incidence of fetal growth  restriction.  I discussed the ultrasound protocol of monitoring fetal growth restriction. Timing of delivery: If severe fetal growth restriction persists, we will recommend delivery at [redacted] weeks gestation.  Delivery between 34 and 37 weeks is possible if antenatal testing is not reassuring (absent end-diastolic flow)  Hepatitis C infection High viremia (> 1 million IU/ML) increases the risk of perinatal transmission rate.  Significantly, antepartum vaginal bleeding also increased the perinatal transmission rate.  Patient does not give history of vaginal bleeding. Recent trials with the Ribavarin-sparing HCV therapy showed significant response.  Antiviral treatment in pregnancy with ledipasvir and sofosbuvir showed good response with cure. Quantitative viral RNA greater than 600,000 IU/mL is associated with an increased risk of vertical transmission.  Patient would like to consider treatment after delivery.  Patient is keen on vaginal delivery.  Absent CSP I discussed fetal MRI that can be performed but may not add additional information that we will alter her management.  Patient prefers to have head ultrasound and MRI after delivery.   Recommendations - Continue weekly antenatal testing including NST till delivery. - Fetal growth assessment in 3 weeks.  Consultation including face-to-face (more than 50%) counseling 40 minutes.

## 2024-02-01 NOTE — Procedures (Signed)
 Krystal Valdez [birth year] [redacted]w[redacted]d  Fetus A Non-Stress Test Interpretation for 02/01/24  Indication: Abnormal genetic screening, IUGR, fetal anomalies  Fetal Heart Rate A Mode: External Baseline Rate (A): 140 bpm Variability: Moderate Accelerations: 10 x 10 Decelerations: None Multiple birth?: No  Uterine Activity Mode: Toco, Palpation Contraction Frequency (min): none noted Resting Tone Palpated: Relaxed  Interpretation (Fetal Testing) Nonstress Test Interpretation: Reactive Comments: Reviewed with Dr. Arnie Bibber

## 2024-02-08 ENCOUNTER — Ambulatory Visit: Payer: MEDICAID

## 2024-02-08 DIAGNOSIS — O365990 Maternal care for other known or suspected poor fetal growth, unspecified trimester, not applicable or unspecified: Secondary | ICD-10-CM

## 2024-02-08 DIAGNOSIS — O0930 Supervision of pregnancy with insufficient antenatal care, unspecified trimester: Secondary | ICD-10-CM

## 2024-02-08 DIAGNOSIS — O285 Abnormal chromosomal and genetic finding on antenatal screening of mother: Secondary | ICD-10-CM

## 2024-02-08 DIAGNOSIS — Z3480 Encounter for supervision of other normal pregnancy, unspecified trimester: Secondary | ICD-10-CM

## 2024-02-12 ENCOUNTER — Encounter: Payer: MEDICAID | Admitting: Women's Health

## 2024-02-14 ENCOUNTER — Ambulatory Visit: Payer: MEDICAID

## 2024-02-15 ENCOUNTER — Ambulatory Visit: Payer: MEDICAID | Attending: Obstetrics and Gynecology

## 2024-02-15 ENCOUNTER — Ambulatory Visit: Payer: MEDICAID

## 2024-02-15 ENCOUNTER — Other Ambulatory Visit: Payer: Self-pay | Admitting: Obstetrics and Gynecology

## 2024-02-15 ENCOUNTER — Ambulatory Visit (HOSPITAL_BASED_OUTPATIENT_CLINIC_OR_DEPARTMENT_OTHER): Payer: MEDICAID | Admitting: Obstetrics and Gynecology

## 2024-02-15 VITALS — BP 120/75 | HR 81

## 2024-02-15 DIAGNOSIS — O365990 Maternal care for other known or suspected poor fetal growth, unspecified trimester, not applicable or unspecified: Secondary | ICD-10-CM | POA: Diagnosis present

## 2024-02-15 DIAGNOSIS — O283 Abnormal ultrasonic finding on antenatal screening of mother: Secondary | ICD-10-CM | POA: Diagnosis present

## 2024-02-15 DIAGNOSIS — Z3A32 32 weeks gestation of pregnancy: Secondary | ICD-10-CM

## 2024-02-15 DIAGNOSIS — O285 Abnormal chromosomal and genetic finding on antenatal screening of mother: Secondary | ICD-10-CM | POA: Diagnosis present

## 2024-02-15 DIAGNOSIS — B182 Chronic viral hepatitis C: Secondary | ICD-10-CM | POA: Diagnosis not present

## 2024-02-15 DIAGNOSIS — O99320 Drug use complicating pregnancy, unspecified trimester: Secondary | ICD-10-CM | POA: Insufficient documentation

## 2024-02-15 DIAGNOSIS — F1190 Opioid use, unspecified, uncomplicated: Secondary | ICD-10-CM | POA: Diagnosis present

## 2024-02-15 DIAGNOSIS — B1920 Unspecified viral hepatitis C without hepatic coma: Secondary | ICD-10-CM | POA: Diagnosis present

## 2024-02-15 DIAGNOSIS — F1120 Opioid dependence, uncomplicated: Secondary | ICD-10-CM | POA: Diagnosis present

## 2024-02-15 DIAGNOSIS — O98413 Viral hepatitis complicating pregnancy, third trimester: Secondary | ICD-10-CM | POA: Diagnosis not present

## 2024-02-15 DIAGNOSIS — O365930 Maternal care for other known or suspected poor fetal growth, third trimester, not applicable or unspecified: Secondary | ICD-10-CM | POA: Diagnosis not present

## 2024-02-15 DIAGNOSIS — O0930 Supervision of pregnancy with insufficient antenatal care, unspecified trimester: Secondary | ICD-10-CM

## 2024-02-15 NOTE — Procedures (Signed)
 Krystal Valdez [birth year] [redacted]w[redacted]d  Fetus A Non-Stress Test Interpretation for 02/15/24  Indication: IUGR and abnormal genetic testing  Fetal Heart Rate A Mode: External Baseline Rate (A): 135 bpm Variability: Moderate Accelerations: 10 x 10 Decelerations: None Multiple birth?: No  Uterine Activity Mode: Palpation, Toco Contraction Frequency (min): none noted Resting Tone Palpated: Relaxed  Interpretation (Fetal Testing) Nonstress Test Interpretation: Non-reactive Comments: Reviewed with Dr. Arna, Pt scheduled for BPP and UAD immediately following NST

## 2024-02-15 NOTE — Progress Notes (Signed)
 After review, MFM consult with provider is not indicated for today  Arna Ranks, MD 02/15/2024 12:34 PM  [facility]

## 2024-02-20 ENCOUNTER — Encounter: Payer: Self-pay | Admitting: Women's Health

## 2024-02-20 ENCOUNTER — Ambulatory Visit: Payer: MEDICAID | Admitting: Women's Health

## 2024-02-20 ENCOUNTER — Other Ambulatory Visit (HOSPITAL_COMMUNITY)
Admission: RE | Admit: 2024-02-20 | Discharge: 2024-02-20 | Disposition: A | Discharge status: Home / Self Care | Payer: MEDICAID | Source: Ambulatory Visit | Attending: Women's Health | Admitting: Women's Health

## 2024-02-20 VITALS — BP 123/77 | HR 103 | Wt 142.2 lb

## 2024-02-20 DIAGNOSIS — O0990 Supervision of high risk pregnancy, unspecified, unspecified trimester: Secondary | ICD-10-CM

## 2024-02-20 DIAGNOSIS — O99323 Drug use complicating pregnancy, third trimester: Secondary | ICD-10-CM

## 2024-02-20 DIAGNOSIS — N898 Other specified noninflammatory disorders of vagina: Secondary | ICD-10-CM | POA: Diagnosis present

## 2024-02-20 DIAGNOSIS — O0993 Supervision of high risk pregnancy, unspecified, third trimester: Secondary | ICD-10-CM | POA: Insufficient documentation

## 2024-02-20 DIAGNOSIS — F1190 Opioid use, unspecified, uncomplicated: Secondary | ICD-10-CM

## 2024-02-20 DIAGNOSIS — O23593 Infection of other part of genital tract in pregnancy, third trimester: Secondary | ICD-10-CM | POA: Diagnosis not present

## 2024-02-20 DIAGNOSIS — O2342 Unspecified infection of urinary tract in pregnancy, second trimester: Secondary | ICD-10-CM

## 2024-02-20 DIAGNOSIS — F1111 Opioid abuse, in remission: Secondary | ICD-10-CM

## 2024-02-20 DIAGNOSIS — F1120 Opioid dependence, uncomplicated: Secondary | ICD-10-CM

## 2024-02-20 DIAGNOSIS — Z3A33 33 weeks gestation of pregnancy: Secondary | ICD-10-CM

## 2024-02-20 DIAGNOSIS — Z634 Disappearance and death of family member: Secondary | ICD-10-CM

## 2024-02-20 DIAGNOSIS — O365990 Maternal care for other known or suspected poor fetal growth, unspecified trimester, not applicable or unspecified: Secondary | ICD-10-CM

## 2024-02-20 MED ORDER — TERCONAZOLE 0.4 % VA CREA: 1.0000 | TOPICAL_CREAM | Freq: Every day | VAGINAL | 0 refills | Status: DC

## 2024-02-20 NOTE — Patient Instructions (Addendum)
Krystal Valdez, thank you for choosing our office today! We appreciate the opportunity to meet your healthcare needs. You may receive a short survey by mail, e-mail, or through MyChart. If you are happy with your care we would appreciate if you could take just a few minutes to complete the survey questions. We read all of your comments and take your feedback very seriously. Thank you again for choosing our office.  [facility] ([address] [place], [place] [address]) Entrance C, located off of [address] Free 24/7 valet parking   CLASSES: Go to [email or web address] to register for classes (childbirth, breastfeeding, waterbirth, infant CPR, daddy bootcamp, etc.)  Call the office ([phone]) or go to [facility] if: You begin to have strong, frequent contractions Your water breaks.  Sometimes it is a big gush of fluid, sometimes it is just a trickle that keeps getting your panties wet or running down your legs You have vaginal bleeding.  It is normal to have a small amount of spotting if your cervix was checked.  You don't feel your baby moving like normal.  If you don't, get you something to eat and drink and lay down and focus on feeling your baby move.   If your baby is still not moving like normal, you should call the office or go to [facility].  Call the office ([phone]) or go to [facility] for these signs of pre-eclampsia: Severe headache that does not go away with Tylenol Visual changes- seeing spots, double, blurred vision Pain under your right breast or upper abdomen that does not go away with Tums or heartburn medicine Nausea and/or vomiting Severe swelling in your hands, feet, and face   Tdap Vaccine It is recommended that you get the Tdap vaccine during the third trimester of EACH pregnancy to help protect your baby from getting pertussis (whooping cough) 27-36 weeks is the BEST time to do  this so that you can pass the protection on to your baby. During pregnancy is better than after pregnancy, but if you are unable to get it during pregnancy it will be offered at the hospital.  You can get this vaccine with us, at the health department, your family doctor, or some local pharmacies Everyone who will be around your baby should also be up-to-date on their vaccines before the baby comes. Adults (who are not pregnant) only need 1 dose of Tdap during adulthood.   [place] Pediatricians/Family Doctors [facility] ([facility]): [address], [phone]           [facility]: [address], [phone]                [facility] ([facility]): [address], [phone] (call to ask if accepting patients) [facility]: [address], [place], [phone]    [place] Pediatricians/Family Doctors [facility] ([facility]): [address], [phone] [facility]: [address], [phone] [facility]: [address], [phone]  [place] [facility]  [facility] ([facility]): [phone] [facility]: [address], [phone]   [place] [facility]: [address], [phone]  [place] [facility]  [facility]: [address], [phone]  Home Blood Pressure Monitoring for Patients   Your provider has recommended that you check your   blood pressure (BP) at least once a week at home. If you do not have a blood pressure cuff at home, one will be provided for you. Contact your provider if you have not received your monitor within 1 week.  ° °Helpful Tips for Accurate Home Blood Pressure Checks  °Don't smoke, exercise, or drink caffeine 30 minutes before checking your BP °Use the restroom before checking your BP (a full bladder can raise your  pressure) °Relax in a comfortable upright chair °Feet on the ground °Left arm resting comfortably on a flat surface at the level of your heart °Legs uncrossed °Back supported °Sit quietly and don't talk °Place the cuff on your bare arm °Adjust snuggly, so that only two fingertips can fit between your skin and the top of the cuff °Check 2 readings separated by at least one minute °Keep a log of your BP readings °For a visual, please reference this diagram: http://[email or web address].care/bpdiagram ° °Provider Name: [facility]     Phone: [phone] ° °Zone 1: ALL CLEAR  °Continue to monitor your symptoms:  °BP reading is less than 140 (top number) or less than 90 (bottom number)  °No right upper stomach pain °No headaches or seeing spots °No feeling nauseated or throwing up °No swelling in face and hands ° °Zone 2: CAUTION °Call your doctor's office for any of the following:  °BP reading is greater than 140 (top number) or greater than 90 (bottom number)  °Stomach pain under your ribs in the middle or right side °Headaches or seeing spots °Feeling nauseated or throwing up °Swelling in face and hands ° °Zone 3: EMERGENCY  °Seek immediate medical care if you have any of the following:  °BP reading is greater than160 (top number) or greater than 110 (bottom number) °Severe headaches not improving with Tylenol °Serious difficulty catching your breath °Any worsening symptoms from Zone 2  °Preterm Labor and Birth Information ° °The normal length of a pregnancy is 39-41 weeks. Preterm labor is when labor starts before 37 completed weeks of pregnancy. °What are the risk factors for preterm labor? °Preterm labor is more likely to occur in women who: °Have certain infections during pregnancy such as a bladder infection, sexually transmitted infection, or infection inside the uterus (chorioamnionitis). °Have a shorter-than-normal cervix. °Have gone into preterm labor before. °Have had surgery on their cervix. °Are younger than age 17  or older than age 35. °Are African American. °Are pregnant with twins or multiple babies (multiple gestation). °Take street drugs or smoke while pregnant. °Do not gain enough weight while pregnant. °Became pregnant shortly after having been pregnant. °What are the symptoms of preterm labor? °Symptoms of preterm labor include: °Cramps similar to those that can happen during a menstrual period. The cramps may happen with diarrhea. °Pain in the abdomen or lower back. °Regular uterine contractions that may feel like tightening of the abdomen. °A feeling of increased pressure in the pelvis. °Increased watery or bloody mucus discharge from the vagina. °Water breaking (ruptured amniotic sac). °Why is it important to recognize signs of preterm labor? °It is important to recognize signs of preterm labor because babies who are born prematurely may not be fully developed. This can put them at an increased risk for: °Long-term (chronic) heart and lung problems. °Difficulty immediately after birth with regulating body systems, including blood sugar, body temperature, heart rate, and breathing rate. °Bleeding in the brain. °Cerebral palsy. °Learning difficulties. °Death. °These risks are highest for babies who are born before 34 weeks   of pregnancy. How is preterm labor treated? Treatment depends on the length of your pregnancy, your condition, and the health of your baby. It may involve: Having a stitch (suture) placed in your cervix to prevent your cervix from opening too early (cerclage). Taking or being given medicines, such as: Hormone medicines. These may be given early in pregnancy to help support the pregnancy. Medicine to stop contractions. Medicines to help mature the babys lungs. These may be prescribed if the risk of delivery is high. Medicines to prevent your baby from developing cerebral palsy. If the labor happens before 34 weeks of pregnancy, you may need to stay in the hospital. What should I do if I  think I am in preterm labor? If you think that you are going into preterm labor, call your health care provider right away. How can I prevent preterm labor in future pregnancies? To increase your chance of having a full-term pregnancy: Do not use any tobacco products, such as cigarettes, chewing tobacco, and e-cigarettes. If you need help quitting, ask your health care provider. Do not use street drugs or medicines that have not been prescribed to you during your pregnancy. Talk with your health care provider before taking any herbal supplements, even if you have been taking them regularly. Make sure you gain a healthy amount of weight during your pregnancy. Watch for infection. If you think that you might have an infection, get it checked right away. Make sure to tell your health care provider if you have gone into preterm labor before. This information is not intended to replace advice given to you by your health care provider. Make sure you discuss any questions you have with your health care provider. Document Revised: 11/23/2018 Document Reviewed: 12/23/2015 Elsevier Patient Education  [place].

## 2024-02-20 NOTE — Progress Notes (Signed)
 HIGH-RISK PREGNANCY VISIT Patient name: Krystal Valdez MRN [id number]  Date of birth: 10-Sep-1995 Chief Complaint:   Routine Prenatal Visit (Vaginal,itching discharge)  History of Present Illness:   Krystal Valdez is a 28 y.o. [phone] female at [redacted]w[redacted]d with an Estimated Date of Delivery: 04/09/24 being seen today for ongoing management of a high-risk pregnancy complicated by severe FGR 1% w/ normal UAD, multiple fetal abnormalities, increased r/f T21, resolved mild poly, suboxone  therapy, active Hep C.    Today she reports vaginal itching/discharge x 1wk. Contractions: Irritability.  .  Movement: Present. denies leaking of fluid.      01/03/2024    9:56 AM 12/19/2023    2:47 PM  Depression screen PHQ 2/9  Decreased Interest 0 0  Down, Depressed, Hopeless 0 0  PHQ - 2 Score 0 0  Altered sleeping 0 0  Tired, decreased energy 1 1  Change in appetite 0 0  Feeling bad or failure about yourself  0 1  Trouble concentrating 0 0  Moving slowly or fidgety/restless 0 0  Suicidal thoughts 0 0  PHQ-9 Score 1 2        01/03/2024    9:56 AM 12/19/2023    2:47 PM  GAD 7 : Generalized Anxiety Score  Nervous, Anxious, on Edge 1 1  Control/stop worrying 0 0  Worry too much - different things 1 1  Trouble relaxing 0 0  Restless 0 0  Easily annoyed or irritable 0 0  Afraid - awful might happen 0 0  Total GAD 7 Score 2 2     Review of Systems:   Pertinent items are noted in HPI Denies abnormal vaginal discharge w/ itching/odor/irritation, headaches, visual changes, shortness of breath, chest pain, abdominal pain, severe nausea/vomiting, or problems with urination or bowel movements unless otherwise stated above. Pertinent History Reviewed:  Reviewed past medical,surgical, social, obstetrical and family history.  Reviewed problem list, medications and allergies. Physical Assessment:   Vitals:   02/20/24 1458 02/20/24 1511  BP: (!) 135/90 123/77  Pulse: (!) 119 (!) 103  Weight: 142 lb 3.2  oz (64.5 kg)   Body mass index is 22.27 kg/m.           Physical Examination:   General appearance: alert, well appearing, and in no distress  Mental status: alert, oriented to person, place, and time  Skin: warm & dry   Extremities: Edema: None    Cardiovascular: normal heart rate noted  Respiratory: normal respiratory effort, no distress  Abdomen: gravid, soft, non-tender  Pelvic: spec exam-cx visually closed, mod amt thick clumpy white d/c c/w yeast         Fetal Status: Fetal Heart Rate (bpm): 152 Fundal Height: 24 cm Movement: Present    Fetal Surveillance Testing today: doppler   Chaperone: Peggy Dones  No results found for this or any previous visit (from the past 24 hours).  Assessment & Plan:  High-risk pregnancy: [id number] at [redacted]w[redacted]d with an Estimated Date of Delivery: 04/09/24   1) Severe FGR 1% w/ normal UAD, followed by MFM  2) Increased r/f T21 w/ multiple fetal abnormalities, resolved polyhydramnios, normal fetal echo, being followed by MFM  3) Suboxone > 8mg  BID-TID by Mliss Louder ([place] FNP), NAS referral ordered/discussed  4) Active HepC> quant 500,000, LFTs 30/33 @ 26wks, repeat 36wks  5) H/O vag x 2, c/s then VBAC> plans TOLAC  6) Presumed vulvovaginal candida> based on exam, CV swab sent, rx terazol  7) Initial bp elevated> pt  states was talking, repeat normal. Sees MFM tomorrow  Meds:  Meds ordered this encounter  Medications   terconazole  (TERAZOL 7 ) 0.4 % vaginal cream    Sig: Place 1 applicator vaginally at bedtime.    Dispense:  45 g    Refill:  0    Labs/procedures today: spec exam and CV swab  Treatment Plan:  per MFM  Reviewed: Preterm labor symptoms and general obstetric precautions including but not limited to vaginal bleeding, contractions, leaking of fluid and fetal movement were reviewed in detail with the patient.  All questions were answered. Does have home bp cuff. Office bp cuff given: not applicable. Check bp daily, let us  know if  consistently >140 and/or >90.  Follow-up: Return in about 1 week (around 02/27/2024) for HROB MD only for remainder of pregnancy.   Future Appointments  Date Time Provider Department Center  02/21/2024  1:00 PM [facility] PROVIDER 1 [facility]  02/21/2024  1:15 PM [facility] NST [facility]  02/21/2024  2:30 PM [facility] US5 [facility]  02/27/2024 10:50 AM Jayne Vonn DEL, MD [facility]  02/29/2024  1:00 PM [facility] PROVIDER 1 [facility]  02/29/2024  1:15 PM [facility] NST [facility]  02/29/2024  2:15 PM [facility] US2 [facility]  03/07/2024 10:30 AM [facility] PROVIDER 1 [facility]  03/07/2024 10:45 AM [facility] NST [facility]  03/07/2024 11:30 AM [facility] US5 [facility]    Orders Placed This Encounter  Procedures   Amb referral to Neonatal Abstinence Syndrome (NAS) Team   Suzen JONELLE Fetters CNM, [facility] 02/20/2024 4:02 PM

## 2024-02-21 ENCOUNTER — Ambulatory Visit: Payer: MEDICAID

## 2024-02-21 ENCOUNTER — Other Ambulatory Visit: Payer: MEDICAID

## 2024-02-22 ENCOUNTER — Ambulatory Visit: Payer: Self-pay | Admitting: Women's Health

## 2024-02-22 LAB — CERVICOVAGINAL ANCILLARY ONLY
Bacterial Vaginitis (gardnerella): POSITIVE — AB
Candida Glabrata: NEGATIVE
Candida Vaginitis: POSITIVE — AB
Chlamydia: NEGATIVE
Comment: NEGATIVE
Comment: NEGATIVE
Comment: NEGATIVE
Comment: NEGATIVE
Comment: NEGATIVE
Comment: NORMAL
Neisseria Gonorrhea: NEGATIVE
Trichomonas: NEGATIVE

## 2024-02-22 MED ORDER — METRONIDAZOLE 500 MG PO TABS: 500.0000 mg | ORAL_TABLET | Freq: Two times a day (BID) | ORAL | 0 refills | Status: DC

## 2024-02-26 ENCOUNTER — Telehealth: Payer: Self-pay

## 2024-02-26 ENCOUNTER — Other Ambulatory Visit: Payer: Self-pay | Admitting: *Deleted

## 2024-02-26 DIAGNOSIS — F1111 Opioid abuse, in remission: Secondary | ICD-10-CM

## 2024-02-26 DIAGNOSIS — O365990 Maternal care for other known or suspected poor fetal growth, unspecified trimester, not applicable or unspecified: Secondary | ICD-10-CM

## 2024-02-26 NOTE — Telephone Encounter (Signed)
 Called patient to ensure she picked up prescription for BV treatment. Educated on causes and answered any questions patient had. She has been taking for two days

## 2024-02-27 ENCOUNTER — Encounter: Payer: Self-pay | Admitting: Obstetrics & Gynecology

## 2024-02-27 ENCOUNTER — Ambulatory Visit: Payer: MEDICAID | Admitting: Obstetrics & Gynecology

## 2024-02-27 VITALS — BP 129/78 | HR 74 | Wt 144.0 lb

## 2024-02-27 DIAGNOSIS — O365930 Maternal care for other known or suspected poor fetal growth, third trimester, not applicable or unspecified: Secondary | ICD-10-CM | POA: Diagnosis not present

## 2024-02-27 DIAGNOSIS — O0990 Supervision of high risk pregnancy, unspecified, unspecified trimester: Secondary | ICD-10-CM

## 2024-02-27 DIAGNOSIS — Z3A34 34 weeks gestation of pregnancy: Secondary | ICD-10-CM

## 2024-02-27 DIAGNOSIS — O365990 Maternal care for other known or suspected poor fetal growth, unspecified trimester, not applicable or unspecified: Secondary | ICD-10-CM

## 2024-02-27 DIAGNOSIS — O0993 Supervision of high risk pregnancy, unspecified, third trimester: Secondary | ICD-10-CM

## 2024-02-27 NOTE — Progress Notes (Signed)
 HIGH-RISK PREGNANCY VISIT Patient name: Krystal Valdez MRN [id number]  Date of birth: [birth year] Chief Complaint:   Routine Prenatal Visit  History of Present Illness:   DONI BACHA is a 28 y.o. H3E5986 female at [redacted]w[redacted]d with an Estimated Date of Delivery: 04/09/24 being seen today for ongoing management of a high-risk pregnancy complicated by     ICD-10-CM   1. Supervision of high risk pregnancy, antepartum  O09.90     2. Fetal growth restriction antepartum: 1% UAD 41%  O36.5990        Today she reports no complaints. Contractions: Not present. Vag. Bleeding: None.  Movement: Present. denies leaking of fluid.      01/03/2024    9:56 AM 12/19/2023    2:47 PM  Depression screen PHQ 2/9  Decreased Interest 0 0  Down, Depressed, Hopeless 0 0  PHQ - 2 Score 0 0  Altered sleeping 0 0  Tired, decreased energy 1 1  Change in appetite 0 0  Feeling bad or failure about yourself  0 1  Trouble concentrating 0 0  Moving slowly or fidgety/restless 0 0  Suicidal thoughts 0 0  PHQ-9 Score 1 2        01/03/2024    9:56 AM 12/19/2023    2:47 PM  GAD 7 : Generalized Anxiety Score  Nervous, Anxious, on Edge 1 1  Control/stop worrying 0 0  Worry too much - different things 1 1  Trouble relaxing 0 0  Restless 0 0  Easily annoyed or irritable 0 0  Afraid - awful might happen 0 0  Total GAD 7 Score 2 2     Review of Systems:   Pertinent items are noted in HPI Denies abnormal vaginal discharge w/ itching/odor/irritation, headaches, visual changes, shortness of breath, chest pain, abdominal pain, severe nausea/vomiting, or problems with urination or bowel movements unless otherwise stated above. Pertinent History Reviewed:  Reviewed past medical,surgical, social, obstetrical and family history.  Reviewed problem list, medications and allergies. Physical Assessment:   Vitals:   02/27/24 1103  BP: 129/78  Pulse: 74  Weight: 144 lb (65.3 kg)  Body mass index is 22.55 kg/m.            Physical Examination:   General appearance: alert, well appearing, and in no distress  Mental status: alert, oriented to person, place, and time  Skin: warm & dry   Extremities:      Cardiovascular: normal heart rate noted  Respiratory: normal respiratory effort, no distress  Abdomen: gravid, soft, non-tender  Pelvic: Cervical exam deferred         Fetal Status:     Movement: Present    Fetal Surveillance Testing today: reactive NST  Krystal Valdez is at [redacted]w[redacted]d Estimated Date of Delivery: 04/09/24  NST being performed due to FGR severe  Today the NST is Reactive  Fetal Monitoring:  Baseline: 140 bpm, Variability: Good {> 6 bpm), Accelerations: Reactive, and Decelerations: Absent   reactive  The accelerations are >15 bpm and more than 2 in 20 minutes  Final diagnosis:  Reactive NST  Vonn VEAR Inch, MD     Chaperone: N/A    No results found for this or any previous visit (from the past 24 hours).  Assessment & Plan:  High-risk pregnancy: [id number] at [redacted]w[redacted]d with an Estimated Date of Delivery: 04/09/24      ICD-10-CM   1. Supervision of high risk pregnancy, antepartum  O09.90     2.  Fetal growth restriction antepartum: 1% UAD 41%  O36.5990          Meds: No orders of the defined types were placed in this encounter.   Orders: No orders of the defined types were placed in this encounter.    Labs/procedures today: NST  Treatment Plan:  twice weekly surveillance    Follow-up: Return for keep scheduled.   Future Appointments  Date Time Provider Department Center  02/29/2024  1:00 PM [facility] PROVIDER 1 [facility]  02/29/2024  1:15 PM [facility] NST [facility]  02/29/2024  2:15 PM [facility] US2 [facility]  03/07/2024 10:30 AM [facility] PROVIDER 1 [facility]  03/07/2024 10:45 AM [facility] NST [facility]  03/07/2024 11:30 AM [facility] US5 [facility]    No orders of the defined types were placed in this encounter.  Vonn VEAR Inch  Attending Physician for the Center  for [facility] 02/27/2024 12:03 PM

## 2024-02-29 ENCOUNTER — Ambulatory Visit: Payer: MEDICAID

## 2024-03-05 ENCOUNTER — Other Ambulatory Visit: Payer: MEDICAID

## 2024-03-05 ENCOUNTER — Encounter: Payer: MEDICAID | Admitting: Obstetrics & Gynecology

## 2024-03-07 ENCOUNTER — Ambulatory Visit: Payer: MEDICAID

## 2024-03-11 ENCOUNTER — Other Ambulatory Visit: Payer: MEDICAID

## 2024-03-11 ENCOUNTER — Encounter: Payer: MEDICAID | Admitting: Obstetrics & Gynecology

## 2024-03-21 ENCOUNTER — Inpatient Hospital Stay (HOSPITAL_COMMUNITY): Payer: MEDICAID | Admitting: Anesthesiology

## 2024-03-21 ENCOUNTER — Encounter (HOSPITAL_COMMUNITY)
Admission: AD | Disposition: A | Discharge status: Home / Self Care | Payer: Self-pay | Source: Home / Self Care | Attending: Obstetrics and Gynecology

## 2024-03-21 ENCOUNTER — Inpatient Hospital Stay (HOSPITAL_COMMUNITY)
Admission: AD | Admit: 2024-03-21 | Discharge: 2024-03-22 | DRG: 797 | Disposition: A | Discharge status: Home / Self Care | Payer: MEDICAID | Attending: Obstetrics and Gynecology | Admitting: Obstetrics and Gynecology

## 2024-03-21 ENCOUNTER — Encounter (HOSPITAL_COMMUNITY): Payer: Self-pay | Admitting: Obstetrics and Gynecology

## 2024-03-21 ENCOUNTER — Other Ambulatory Visit: Payer: Self-pay

## 2024-03-21 DIAGNOSIS — O34219 Maternal care for unspecified type scar from previous cesarean delivery: Secondary | ICD-10-CM | POA: Diagnosis present

## 2024-03-21 DIAGNOSIS — B1920 Unspecified viral hepatitis C without hepatic coma: Secondary | ICD-10-CM | POA: Diagnosis present

## 2024-03-21 DIAGNOSIS — I4891 Unspecified atrial fibrillation: Secondary | ICD-10-CM

## 2024-03-21 DIAGNOSIS — F418 Other specified anxiety disorders: Secondary | ICD-10-CM

## 2024-03-21 DIAGNOSIS — O0933 Supervision of pregnancy with insufficient antenatal care, third trimester: Secondary | ICD-10-CM | POA: Diagnosis not present

## 2024-03-21 DIAGNOSIS — Z302 Encounter for sterilization: Secondary | ICD-10-CM | POA: Diagnosis not present

## 2024-03-21 DIAGNOSIS — O403XX0 Polyhydramnios, third trimester, not applicable or unspecified: Secondary | ICD-10-CM | POA: Diagnosis present

## 2024-03-21 DIAGNOSIS — Z3A37 37 weeks gestation of pregnancy: Secondary | ICD-10-CM

## 2024-03-21 DIAGNOSIS — Z833 Family history of diabetes mellitus: Secondary | ICD-10-CM

## 2024-03-21 DIAGNOSIS — O99334 Smoking (tobacco) complicating childbirth: Secondary | ICD-10-CM | POA: Diagnosis present

## 2024-03-21 DIAGNOSIS — F17210 Nicotine dependence, cigarettes, uncomplicated: Secondary | ICD-10-CM | POA: Diagnosis present

## 2024-03-21 DIAGNOSIS — Z8249 Family history of ischemic heart disease and other diseases of the circulatory system: Secondary | ICD-10-CM

## 2024-03-21 DIAGNOSIS — O9902 Anemia complicating childbirth: Secondary | ICD-10-CM | POA: Diagnosis present

## 2024-03-21 DIAGNOSIS — O365990 Maternal care for other known or suspected poor fetal growth, unspecified trimester, not applicable or unspecified: Secondary | ICD-10-CM | POA: Diagnosis present

## 2024-03-21 DIAGNOSIS — O0930 Supervision of pregnancy with insufficient antenatal care, unspecified trimester: Secondary | ICD-10-CM

## 2024-03-21 DIAGNOSIS — Z3490 Encounter for supervision of normal pregnancy, unspecified, unspecified trimester: Secondary | ICD-10-CM

## 2024-03-21 DIAGNOSIS — O285 Abnormal chromosomal and genetic finding on antenatal screening of mother: Secondary | ICD-10-CM | POA: Diagnosis present

## 2024-03-21 DIAGNOSIS — O365930 Maternal care for other known or suspected poor fetal growth, third trimester, not applicable or unspecified: Secondary | ICD-10-CM | POA: Diagnosis present

## 2024-03-21 DIAGNOSIS — O9842 Viral hepatitis complicating childbirth: Secondary | ICD-10-CM | POA: Diagnosis present

## 2024-03-21 DIAGNOSIS — F1120 Opioid dependence, uncomplicated: Secondary | ICD-10-CM

## 2024-03-21 DIAGNOSIS — Z98891 History of uterine scar from previous surgery: Secondary | ICD-10-CM

## 2024-03-21 DIAGNOSIS — O34211 Maternal care for low transverse scar from previous cesarean delivery: Secondary | ICD-10-CM | POA: Diagnosis not present

## 2024-03-21 DIAGNOSIS — O4202 Full-term premature rupture of membranes, onset of labor within 24 hours of rupture: Secondary | ICD-10-CM | POA: Diagnosis not present

## 2024-03-21 HISTORY — PX: TUBAL LIGATION: SHX77

## 2024-03-21 LAB — TYPE AND SCREEN
ABO/RH(D): B POS
Antibody Screen: NEGATIVE

## 2024-03-21 LAB — CBC
HCT: 41.2 % (ref 36.0–46.0)
Hemoglobin: 14.3 g/dL (ref 12.0–15.0)
MCH: 29.5 pg (ref 26.0–34.0)
MCHC: 34.7 g/dL (ref 30.0–36.0)
MCV: 84.9 fL (ref 80.0–100.0)
Platelets: 208 K/uL (ref 150–400)
RBC: 4.85 MIL/uL (ref 3.87–5.11)
RDW: 13.3 % (ref 11.5–15.5)
WBC: 18.4 K/uL — ABNORMAL HIGH (ref 4.0–10.5)
nRBC: 0 % (ref 0.0–0.2)

## 2024-03-21 LAB — RPR: RPR Ser Ql: NONREACTIVE

## 2024-03-21 SURGERY — LIGATION, FALLOPIAN TUBE, POSTPARTUM
Anesthesia: Spinal | Site: Abdomen | Laterality: Bilateral

## 2024-03-21 MED ORDER — EPHEDRINE 5 MG/ML INJ
INTRAVENOUS | Status: AC
Start: 2024-03-21 — End: 2024-03-21
  Filled 2024-03-21: qty 5

## 2024-03-21 MED ORDER — BENZOCAINE-MENTHOL 20-0.5 % EX AERO: 1.0000 | INHALATION_SPRAY | CUTANEOUS | Status: DC | PRN

## 2024-03-21 MED ORDER — ONDANSETRON HCL 4 MG/2ML IJ SOLN: 4.0000 mg | INTRAMUSCULAR | Status: DC | PRN

## 2024-03-21 MED ORDER — OXYCODONE HCL 5 MG PO TABS: 5.0000 mg | ORAL_TABLET | Freq: Once | ORAL | Status: DC | PRN

## 2024-03-21 MED ORDER — DIPHENHYDRAMINE HCL 25 MG PO CAPS: 25.0000 mg | ORAL_CAPSULE | Freq: Four times a day (QID) | ORAL | Status: DC | PRN

## 2024-03-21 MED ORDER — SCOPOLAMINE 1 MG/3DAYS TD PT72
1.0000 | MEDICATED_PATCH | Freq: Once | TRANSDERMAL | Status: DC
  Administered 2024-03-21: 1.5 mg via TRANSDERMAL
  Filled 2024-03-21: qty 1

## 2024-03-21 MED ORDER — OXYTOCIN 10 UNIT/ML IJ SOLN
INTRAMUSCULAR | Status: AC
  Administered 2024-03-21: 10 [IU] via INTRAMUSCULAR
  Filled 2024-03-21: qty 1

## 2024-03-21 MED ORDER — BUPRENORPHINE HCL-NALOXONE HCL 8-2 MG SL SUBL: 1.0000 | SUBLINGUAL_TABLET | Freq: Three times a day (TID) | SUBLINGUAL | Status: DC

## 2024-03-21 MED ORDER — ACETAMINOPHEN 10 MG/ML IV SOLN
INTRAVENOUS | Status: DC | PRN
  Administered 2024-03-21: 1000 mg via INTRAVENOUS

## 2024-03-21 MED ORDER — IBUPROFEN 600 MG PO TABS
600.0000 mg | ORAL_TABLET | Freq: Four times a day (QID) | ORAL | Status: DC
  Administered 2024-03-21 – 2024-03-22 (×3): 600 mg via ORAL
  Filled 2024-03-21 (×4): qty 1

## 2024-03-21 MED ORDER — OXYCODONE HCL 5 MG PO TABS: 10.0000 mg | ORAL_TABLET | ORAL | Status: DC | PRN

## 2024-03-21 MED ORDER — BUPRENORPHINE HCL-NALOXONE HCL 8-2 MG SL SUBL
1.0000 | SUBLINGUAL_TABLET | Freq: Every day | SUBLINGUAL | Status: DC
  Filled 2024-03-21: qty 1

## 2024-03-21 MED ORDER — METOCLOPRAMIDE HCL 10 MG PO TABS
10.0000 mg | ORAL_TABLET | Freq: Once | ORAL | Status: AC
  Administered 2024-03-21: 10 mg via ORAL
  Filled 2024-03-21: qty 1

## 2024-03-21 MED ORDER — STERILE WATER FOR IRRIGATION IR SOLN
Status: DC | PRN
  Administered 2024-03-21: 1000 mL

## 2024-03-21 MED ORDER — DIPHENHYDRAMINE HCL 25 MG PO CAPS: 25.0000 mg | ORAL_CAPSULE | ORAL | Status: DC | PRN

## 2024-03-21 MED ORDER — FENTANYL CITRATE (PF) 100 MCG/2ML IJ SOLN
25.0000 ug | INTRAMUSCULAR | Status: DC | PRN
  Administered 2024-03-21: 50 ug via INTRAVENOUS

## 2024-03-21 MED ORDER — EPHEDRINE SULFATE-NACL 50-0.9 MG/10ML-% IV SOSY
PREFILLED_SYRINGE | INTRAVENOUS | Status: DC | PRN
  Administered 2024-03-21 (×2): 5 mg via INTRAVENOUS

## 2024-03-21 MED ORDER — DEXMEDETOMIDINE HCL IN NACL 80 MCG/20ML IV SOLN
INTRAVENOUS | Status: DC | PRN
Start: 2024-03-21 — End: 2024-03-21
  Administered 2024-03-21: 4 ug via INTRAVENOUS
  Administered 2024-03-21 (×2): 8 ug via INTRAVENOUS

## 2024-03-21 MED ORDER — PRENATAL MULTIVITAMIN CH: 1.0000 | ORAL_TABLET | Freq: Every day | ORAL | Status: DC

## 2024-03-21 MED ORDER — SODIUM CHLORIDE 0.9% FLUSH: 3.0000 mL | INTRAVENOUS | Status: DC | PRN

## 2024-03-21 MED ORDER — FAMOTIDINE 20 MG PO TABS
40.0000 mg | ORAL_TABLET | Freq: Once | ORAL | Status: AC
  Administered 2024-03-21: 40 mg via ORAL
  Filled 2024-03-21: qty 2

## 2024-03-21 MED ORDER — SENNOSIDES-DOCUSATE SODIUM 8.6-50 MG PO TABS: 2.0000 | ORAL_TABLET | Freq: Every day | ORAL | Status: DC

## 2024-03-21 MED ORDER — BUPIVACAINE HCL (PF) 0.25 % IJ SOLN
INTRAMUSCULAR | Status: DC | PRN
  Administered 2024-03-21: 10 mL

## 2024-03-21 MED ORDER — PROPOFOL 10 MG/ML IV BOLUS
INTRAVENOUS | Status: AC
Start: 2024-03-21 — End: 2024-03-21
  Filled 2024-03-21: qty 20

## 2024-03-21 MED ORDER — IBUPROFEN 600 MG PO TABS: 600.0000 mg | ORAL_TABLET | Freq: Four times a day (QID) | ORAL | Status: DC

## 2024-03-21 MED ORDER — NALOXONE HCL 4 MG/10ML IJ SOLN: 1.0000 ug/kg/h | INTRAVENOUS | Status: DC | PRN

## 2024-03-21 MED ORDER — OXYCODONE HCL 5 MG PO TABS: 5.0000 mg | ORAL_TABLET | ORAL | Status: DC | PRN

## 2024-03-21 MED ORDER — ACETAMINOPHEN 325 MG PO TABS: 650.0000 mg | ORAL_TABLET | ORAL | Status: DC | PRN

## 2024-03-21 MED ORDER — NICOTINE 14 MG/24HR TD PT24
14.0000 mg | MEDICATED_PATCH | Freq: Every day | TRANSDERMAL | Status: DC
  Administered 2024-03-21: 14 mg via TRANSDERMAL
  Filled 2024-03-21 (×2): qty 1

## 2024-03-21 MED ORDER — SIMETHICONE 80 MG PO CHEW: 80.0000 mg | CHEWABLE_TABLET | ORAL | Status: DC | PRN

## 2024-03-21 MED ORDER — MORPHINE SULFATE (PF) 0.5 MG/ML IJ SOLN
INTRAMUSCULAR | Status: DC | PRN
  Administered 2024-03-21: 150 ug via INTRATHECAL

## 2024-03-21 MED ORDER — OXYTOCIN 10 UNIT/ML IJ SOLN: 10.0000 [IU] | Freq: Once | INTRAMUSCULAR | Status: AC

## 2024-03-21 MED ORDER — MORPHINE SULFATE (PF) 0.5 MG/ML IJ SOLN
INTRAMUSCULAR | Status: AC
  Filled 2024-03-21: qty 10

## 2024-03-21 MED ORDER — MORPHINE SULFATE (PF) 2 MG/ML IV SOLN
2.0000 mg | INTRAVENOUS | Status: DC | PRN
  Administered 2024-03-21: 2 mg via INTRAVENOUS
  Filled 2024-03-21: qty 1

## 2024-03-21 MED ORDER — SIMETHICONE 80 MG PO CHEW
80.0000 mg | CHEWABLE_TABLET | ORAL | Status: DC | PRN
  Filled 2024-03-21: qty 1

## 2024-03-21 MED ORDER — TETANUS-DIPHTH-ACELL PERTUSSIS 5-2.5-18.5 LF-MCG/0.5 IM SUSY: 0.5000 mL | PREFILLED_SYRINGE | Freq: Once | INTRAMUSCULAR | Status: DC

## 2024-03-21 MED ORDER — ZOLPIDEM TARTRATE 5 MG PO TABS: 5.0000 mg | ORAL_TABLET | Freq: Every evening | ORAL | Status: DC | PRN

## 2024-03-21 MED ORDER — SENNOSIDES-DOCUSATE SODIUM 8.6-50 MG PO TABS
2.0000 | ORAL_TABLET | ORAL | Status: DC
  Filled 2024-03-21: qty 2

## 2024-03-21 MED ORDER — ONDANSETRON HCL 4 MG/2ML IJ SOLN
4.0000 mg | INTRAMUSCULAR | Status: DC | PRN
  Administered 2024-03-21: 4 mg via INTRAVENOUS

## 2024-03-21 MED ORDER — ACETAMINOPHEN 10 MG/ML IV SOLN
INTRAVENOUS | Status: AC
  Filled 2024-03-21: qty 100

## 2024-03-21 MED ORDER — ONDANSETRON HCL 4 MG/2ML IJ SOLN: 4.0000 mg | Freq: Three times a day (TID) | INTRAMUSCULAR | Status: DC | PRN

## 2024-03-21 MED ORDER — COCONUT OIL OIL
1.0000 | TOPICAL_OIL | Status: DC | PRN
Start: 2024-03-21 — End: 2024-03-22

## 2024-03-21 MED ORDER — PROPOFOL 10 MG/ML IV BOLUS
INTRAVENOUS | Status: DC | PRN
  Administered 2024-03-21: 150 mg via INTRAVENOUS

## 2024-03-21 MED ORDER — LACTATED RINGERS IV SOLN: INTRAVENOUS | Status: DC

## 2024-03-21 MED ORDER — OXYCODONE HCL 5 MG PO TABS
5.0000 mg | ORAL_TABLET | ORAL | Status: DC | PRN
  Administered 2024-03-21 (×2): 5 mg via ORAL
  Filled 2024-03-21 (×3): qty 1

## 2024-03-21 MED ORDER — DIPHENHYDRAMINE HCL 50 MG/ML IJ SOLN: 12.5000 mg | INTRAMUSCULAR | Status: DC | PRN

## 2024-03-21 MED ORDER — BUPIVACAINE HCL (PF) 0.25 % IJ SOLN
INTRAMUSCULAR | Status: AC
  Filled 2024-03-21: qty 30

## 2024-03-21 MED ORDER — SODIUM CHLORIDE 0.9 % IR SOLN
Status: DC | PRN
  Administered 2024-03-21: 1000 mL

## 2024-03-21 MED ORDER — ONDANSETRON HCL 4 MG PO TABS: 4.0000 mg | ORAL_TABLET | ORAL | Status: DC | PRN

## 2024-03-21 MED ORDER — FENTANYL CITRATE (PF) 100 MCG/2ML IJ SOLN
INTRAMUSCULAR | Status: AC
  Filled 2024-03-21: qty 2

## 2024-03-21 MED ORDER — FENTANYL CITRATE (PF) 100 MCG/2ML IJ SOLN: 50.0000 ug | Freq: Once | INTRAMUSCULAR | Status: DC

## 2024-03-21 MED ORDER — DEXMEDETOMIDINE HCL IN NACL 80 MCG/20ML IV SOLN
INTRAVENOUS | Status: AC
  Filled 2024-03-21: qty 20

## 2024-03-21 MED ORDER — OXYCODONE HCL 5 MG/5ML PO SOLN: 5.0000 mg | Freq: Once | ORAL | Status: DC | PRN

## 2024-03-21 MED ORDER — DIBUCAINE (PERIANAL) 1 % EX OINT: 1.0000 | TOPICAL_OINTMENT | CUTANEOUS | Status: DC | PRN

## 2024-03-21 MED ORDER — OXYTOCIN-SODIUM CHLORIDE 30-0.9 UT/500ML-% IV SOLN: 2.5000 [IU]/h | INTRAVENOUS | Status: DC | PRN

## 2024-03-21 MED ORDER — ACETAMINOPHEN 500 MG PO TABS
1000.0000 mg | ORAL_TABLET | Freq: Four times a day (QID) | ORAL | Status: DC
  Administered 2024-03-21 – 2024-03-22 (×3): 1000 mg via ORAL
  Filled 2024-03-21 (×3): qty 2

## 2024-03-21 MED ORDER — ACETAMINOPHEN 325 MG PO TABS
650.0000 mg | ORAL_TABLET | ORAL | Status: DC | PRN
  Filled 2024-03-21: qty 2

## 2024-03-21 MED ORDER — POVIDONE-IODINE 10 % EX SWAB: 2.0000 | Freq: Once | CUTANEOUS | Status: DC

## 2024-03-21 MED ORDER — COCONUT OIL OIL: 1.0000 | TOPICAL_OIL | Status: DC | PRN

## 2024-03-21 MED ORDER — WITCH HAZEL-GLYCERIN EX PADS
1.0000 | MEDICATED_PAD | CUTANEOUS | Status: DC | PRN
Start: 2024-03-21 — End: 2024-03-22

## 2024-03-21 MED ORDER — MEASLES, MUMPS & RUBELLA VAC IJ SOLR: 0.5000 mL | Freq: Once | INTRAMUSCULAR | Status: DC

## 2024-03-21 MED ORDER — NALOXONE HCL 0.4 MG/ML IJ SOLN: 0.4000 mg | INTRAMUSCULAR | Status: DC | PRN

## 2024-03-21 MED ORDER — FENTANYL CITRATE (PF) 100 MCG/2ML IJ SOLN
100.0000 ug | Freq: Once | INTRAMUSCULAR | Status: AC
  Administered 2024-03-21: 100 ug via INTRAVENOUS
  Filled 2024-03-21: qty 2

## 2024-03-21 MED ORDER — WITCH HAZEL-GLYCERIN EX PADS: 1.0000 | MEDICATED_PAD | CUTANEOUS | Status: DC | PRN

## 2024-03-21 MED ORDER — ONDANSETRON HCL 4 MG/2ML IJ SOLN
INTRAMUSCULAR | Status: AC
  Filled 2024-03-21: qty 2

## 2024-03-21 MED ORDER — BUPIVACAINE IN DEXTROSE 0.75-8.25 % IT SOLN
INTRATHECAL | Status: DC | PRN
  Administered 2024-03-21: 1.6 mg via INTRATHECAL

## 2024-03-21 SURGICAL SUPPLY — 20 items
BLADE SURG 11 STRL SS (BLADE) ×1 IMPLANT
CHLORAPREP W/TINT 26 (MISCELLANEOUS) ×2 IMPLANT
DRSG OPSITE POSTOP 3X4 (GAUZE/BANDAGES/DRESSINGS) ×1 IMPLANT
DURAPREP 26ML APPLICATOR (WOUND CARE) IMPLANT
GLOVE BIOGEL PI IND STRL 7.0 (GLOVE) ×1 IMPLANT
GLOVE BIOGEL PI IND STRL 7.5 (GLOVE) ×2 IMPLANT
GLOVE ECLIPSE 7.5 STRL STRAW (GLOVE) ×1 IMPLANT
GOWN STRL REUS W/TWL LRG LVL3 (GOWN DISPOSABLE) ×2 IMPLANT
NEEDLE HYPO 22GX1.5 SAFETY (NEEDLE) ×1 IMPLANT
NS IRRIG 1000ML POUR BTL (IV SOLUTION) ×1 IMPLANT
PACK ABDOMINAL MINOR (CUSTOM PROCEDURE TRAY) ×1 IMPLANT
PROTECTOR NERVE ULNAR (MISCELLANEOUS) ×1 IMPLANT
SPONGE LAP 18X18 RF (DISPOSABLE) ×1 IMPLANT
SPONGE LAP 4X18 RFD (DISPOSABLE) IMPLANT
SUT PLAIN 0 NONE (SUTURE) IMPLANT
SUT VICRYL 0 UR6 27IN ABS (SUTURE) ×1 IMPLANT
SUT VICRYL 4-0 PS2 18IN ABS (SUTURE) ×1 IMPLANT
SYR CONTROL 10ML LL (SYRINGE) ×1 IMPLANT
TOWEL OR 17X24 6PK STRL BLUE (TOWEL DISPOSABLE) ×2 IMPLANT
TRAY FOLEY W/BAG SLVR 14FR (SET/KITS/TRAYS/PACK) ×1 IMPLANT

## 2024-03-21 NOTE — Anesthesia Procedure Notes (Signed)
 Procedure Name: LMA Insertion Date/Time: 03/21/2024 12:38 PM  Performed by: Steen Clarita CROME, CRNAPre-anesthesia Checklist: Patient identified, Patient being monitored, Emergency Drugs available, Timeout performed and Suction available Patient Re-evaluated:Patient Re-evaluated prior to induction Oxygen Delivery Method: Circle System Utilized Preoxygenation: Pre-oxygenation with 100% oxygen Induction Type: IV induction Ventilation: Mask ventilation without difficulty LMA: LMA inserted LMA Size: 4.0 Number of attempts: 1 Placement Confirmation: positive ETCO2 and breath sounds checked- equal and bilateral Tube secured with: Tape

## 2024-03-21 NOTE — Op Note (Signed)
 Krystal Valdez 03/21/2024  PREOPERATIVE DIAGNOSES: Undesired fertility  POSTOPERATIVE DIAGNOSES: Same  PROCEDURE:  Postpartum Bilateral Salpingectomy  SURGEON: Dr.  Devaughn Ban, MD  ASSISTANT:  Dr. Von.  An experienced assistant was required given the standard of surgical care given the complexity of the case.  This assistant was needed for exposure, dissection, suctioning, retraction, instrument exchange, and for overall help during the procedure.  ANESTHESIA:  Epidural and local analgesia using 10 ml of 0.25% Marcaine   COMPLICATIONS:  None immediate.  ESTIMATED BLOOD LOSS: <10 ml.  INDICATIONS:  28 y.o. H3E4985 with undesired fertility, status post vaginal delivery, desires permanent sterilization.  Other reversible forms of contraception were discussed with patient; she declines all other modalities. Risks of procedure discussed with patient including but not limited to: risk of regret, permanence of method, bleeding, infection, injury to surrounding organs and need for additional procedures.  Discussed failure risk of 1% with increased risk of ectopic gestation if pregnancy occurs.  Also discussed possibility of post-tubal syndrome with increased pelvic pain or menstrual irregularities.  Patient verbalized understanding of these risks and wants to proceed with sterilization.  Written informed consent obtained.     FINDINGS:  Normal uterus, tubes, and ovaries. Excised fallopian tubes were sent to pathology.   PROCEDURE DETAILS: The patient was taken to the operating room where her epidural anesthesia was dosed up to surgical level and found to be adequate.  She was then placed in the dorsal supine position and prepped and draped in sterile fashion.   After an adequate timeout was performed, attention was turned to the patient's abdomen   A small transverse skin incision was made under the umbilical fold after local analgesia was administered.. The incision was taken down to the layer of  fascia using the scalpel, and fascia was incised, and extended bilaterally using Mayo scissors. The peritoneum was entered in a sharp fashion.    Attention was then turned to the left fallopian tube. Two pairs of Kelly forceps were placed on the mesosalpinx underneath most of the tube.  This pedicle was double suture ligated with 2-0 plain gut, and the tube including the fimbriated end was excised.  The right fallopian tube was then identified, doubly ligated, and excised in a similar fashion allowing for bilateral tubal sterilization via bilateral salpingectomy. Good hemostasis was noted overall.  The instruments were then removed from the patient's abdomen and the fascial incision was repaired with 0 Vicryl, and the skin was closed with a 4-0 Vicryl subcuticular stitch. The patient tolerated the procedure well.  Instrument, sponge, and needle counts were correct times three.  The patient was then taken to the recovery room awake and in stable condition.   Devaughn Ban, MD Center for [employer], [facility]

## 2024-03-21 NOTE — Transfer of Care (Signed)
 Immediate Anesthesia Transfer of Care Note  Patient: Krystal Valdez  Procedure(s) Performed: LIGATION, FALLOPIAN TUBE, POSTPARTUM (Bilateral: Abdomen)  Patient Location: PACU  Anesthesia Type:General  Level of Consciousness: drowsy  Airway & Oxygen Therapy: Patient Spontanous Breathing  Post-op Assessment: Report given to RN and Post -op Vital signs reviewed and stable  Post vital signs: Reviewed and stable  Last Vitals:  Vitals Value Taken Time  BP 102/57 03/21/24 13:18  Temp    Pulse 63 03/21/24 13:20  Resp 8 03/21/24 13:20  SpO2 100 % 03/21/24 13:20  Vitals shown include unfiled device data.  Last Pain:  Vitals:   03/21/24 1052  TempSrc:   PainSc: 7          Complications: No notable events documented.

## 2024-03-21 NOTE — H&P (Addendum)
 Krystal Valdez is a 28 y.o. female presenting for SOL with successful VBAC on the side of the road. SROM 0310 clear fluid. VBAC at 0320. 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 active hepC Vagx2-C/S-VBAC,plansTOLAC__w/ BTS (5/21)   anx/dep-no meds .  OB History     Gravida  6   Para  4   Term  4   Preterm      AB  1   Living  3      SAB      IAB      Ectopic      Multiple      Live Births  4          Past Medical History:  Diagnosis Date   Allergy    Anxiety    Asthma    Asthma 11/02/2012   Depression    History of atrial fibrillation    Left breast abscess 04/02/2013   Will change antibiotics to septra  Ds and refer to Dr Mavis 8/21 at 11 am   Mastitis, left, acute 03/28/2013   rx augmentin .   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   Septic arthritis of vertebra (HCC)    UTI (lower urinary tract infection)    Vaginal Pap smear, abnormal    Past Surgical History:  Procedure Laterality Date   CESAREAN SECTION     INCISION AND DRAINAGE ABSCESS Left 04/05/2013   Procedure: INCISION AND DRAINAGE LEFT BREAST ABSCESS;  Surgeon: Oneil DELENA Mavis, MD;  Location: [facility];  Service: General;  Laterality: Left;   MYRINGOTOMY     bilateral, x 2   NO PAST SURGERIES     TEE WITHOUT CARDIOVERSION N/A 01/14/2022   Procedure: TRANSESOPHAGEAL ECHOCARDIOGRAM (TEE);  Surgeon: Raford Riggs, MD;  Location: [facility] ENDOSCOPY;  Service: Cardiovascular;  Laterality: N/A;   Family History: family history includes Asthma in her daughter and daughter; Autoimmune disease in her daughter; Brain cancer in her maternal grandmother; Cancer in an other family member; Diabetes in her father, maternal grandmother, mother, paternal  grandfather, and another family member; Drug abuse in her mother; Hyperlipidemia in her father; Hypertension in her father and another family member; Other in her daughter; Thyroid disease in her maternal grandmother. Social History:  reports that she has been smoking cigarettes. She has a 3 pack-year smoking history. She has never used smokeless tobacco. She reports that she does not currently use drugs after having used the following drugs: IV, Marijuana, and Heroin. She reports that she does not drink alcohol.      NURSING  PROVIDER  Office Location [facility] Dating by 26wk scan  [facility] Model Traditional Anatomy U/S Abnormal female 'RJ'  Initiated care at  13wks                 Language  English               LAB RESULTS   Support Person   Genetics NIPS: ^RF T21 AFP: too late      NT/IT (FT only) Too late      Carrier Screen Horizon: neg 2023  Rhogam  B/Positive/-- (05/27 0943) A1C/GTT Early HgbA1C:  Third trimester 2 hr GTT: 73/82/85  Flu Vaccine  TDaP Vaccine   Blood Type B/Positive/-- (05/27 0943)  RSV Vaccine   Antibody Negative (05/27 0943)  COVID Vaccine   Rubella 8.19 (05/27 0943)  Feeding Plan formula RPR Non Reactive (05/27 0943)  Contraception BTS HBsAg Negative (05/27 0943)  Circumcision yes HIV Non Reactive (05/27 0943)  Pediatrician  [facility] HCVAb Reactive (05/27 9056)  Prenatal Classes discussed      BTL Consent   Pap 12/19/23 NILM, -HRHPV  BTL Pre-payment   GC/CT Initial:  -/- 36wks:    VBAC Consent   GBS For PCN allergy, check sensitivities   BRx Optimized? [ ]  yes   [ ]  no      DME Rx [ x] BP cuff [ ]  Weight Scale Waterbirth  [ ]  Class [ ]  Consent [ ]  CNM visit  PHQ9 & GAD7 [ x ] new OB [  ] 28 weeks  [  ] 36 weeks Induction  [ ]  Orders Entered [ ] Foley Y/N   Review of Systems  Constitutional:  Negative for chills, fatigue and fever.  Eyes:  Negative for pain and visual disturbance.  Respiratory:  Negative for apnea, shortness of breath and wheezing.    Cardiovascular:  Negative for chest pain and palpitations.  Gastrointestinal:  Positive for abdominal pain. Negative for constipation, diarrhea, nausea and vomiting.  Genitourinary:  Positive for pelvic pain, vaginal bleeding and vaginal discharge. Negative for difficulty urinating, dysuria and vaginal pain.  Musculoskeletal:  Negative for back pain.  Neurological:  Negative for seizures, weakness and headaches.  Psychiatric/Behavioral:  Negative for suicidal ideas.    Maternal Medical History:  Reason for admission: Contractions.  Nausea.  Contractions: Frequency: regular.   Perceived severity is strong.   Fetal activity: Perceived fetal activity is normal.   Prenatal complications: IUGR, polyhydramnios and substance abuse.   Prenatal Complications - Diabetes: none.     Blood pressure (!) 145/92, pulse 67, temperature (!) 97.5 F (36.4 C), temperature source Oral, resp. rate 19, height 5' 7 (1.702 m), weight 65.8 kg, SpO2 100%, not currently breastfeeding. Exam Physical Exam Vitals and nursing note reviewed. Exam conducted with a chaperone present.  Constitutional:      General: She is in acute distress.     Appearance: Normal appearance. She is not ill-appearing.  HENT:     Head: Normocephalic.  Cardiovascular:     Rate and Rhythm: Normal rate and regular rhythm.  Pulmonary:     Effort: Pulmonary effort is normal.  Abdominal:     Palpations: Abdomen is soft.     Tenderness: There is abdominal tenderness.  Musculoskeletal:     Cervical back: Normal range of motion.  Skin:    General: Skin is warm and dry.  Neurological:     Mental Status: She is alert and oriented to person, place, and time.  Psychiatric:        Mood and Affect: Mood normal.     Prenatal labs: ABO, Rh: --/--/PENDING (08/07 0530) Antibody: PENDING (08/07 0530) Rubella: 8.19 (05/27 0943) RPR: Non Reactive (05/27 0943)  HBsAg: Negative (05/27 0943)  HIV: Non Reactive (05/27 0943)  GBS:   unknown    Assessment/Plan: Krystal Valdez , a  28 y.o. [id number] at [redacted]w[redacted]d presents following a successful VBAC on at home.   Labor: Placenta to be delivered in MAU  FWB: baby sent straight to NICU  GBS Unknown FNQ:Anuuoz  MOC: planning BTL. Pt last at ice cream at 0100. Recommended patient be NPO until able to perform  tubal at 12pm.   Circ: unsure  - admit to [facility] following recovery in MAU.   Claris CHRISTELLA Cedar, MSN CNM  03/21/2024, 6:22 AM

## 2024-03-21 NOTE — Progress Notes (Signed)
 Patient desires permanent sterilization.  Other reversible forms of contraception were discussed with patient; she declines all other modalities. Risks of procedure discussed with patient including but not limited to: risk of regret, permanence of method, bleeding, infection, injury to surrounding organs and need for additional procedures.  Failure risk of 1-2 % with increased risk of ectopic gestation if pregnancy occurs was also discussed with patient.  Patient verbalized understanding of these risks and wants to proceed with sterilization.  Written informed consent obtained.  To OR when ready.  Patient elects salpingectomy if safe feasible, is aware this is irreversible.

## 2024-03-21 NOTE — Discharge Summary (Signed)
 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 yo [id number] at [redacted]w[redacted]d was admitted s/p VBAC on 03/21/2024. Labor course was complicated by precipitous labor/delivery  Membrane Rupture Time/Date: 3:10 AM,03/21/2024  Delivery Method:VBAC, Spontaneous Operative Delivery:N/A Episiotomy: None Lacerations:  None Patient had a postpartum course complicated by ***.  She is ambulating, tolerating a regular diet, passing flatus, and urinating well. Patient is discharged home in stable condition on 03/21/24.  Newborn Data: Birth date:03/21/2024 Birth time:3:20 AM Gender:Female Living status:Living Apgars: ,  Weight:   Magnesium Sulfate received: No BMZ received: No Rhophylac:N/A MMR:N/A T-DaP:offered postpartum Flu: N/A RSV Vaccine received: No Transfusion:No  Immunizations received: Immunization History  Administered Date(s) Administered   Tdap 03/15/2013, 06/08/2023    Physical exam  Vitals:   03/21/24 0654 03/21/24 0700 03/21/24 0730 03/21/24 0811  BP: 114/61 131/77 127/73 122/77  Pulse: 67 71 63 79  Resp:   18 14  Temp: 97.7 F (36.5  C)   97.8 F (36.6 C)  TempSrc: Oral   Rectal  SpO2:  100% 99% 99%  Weight:      Height:       General: {Exam; general:21111117} Lochia: {Desc; appropriate/inappropriate:30686::appropriate} Uterine Fundus: {Desc; firm/soft:30687} Incision: {Exam; incision:21111123} DVT Evaluation: {Exam; dvt:2111122} Labs: Lab Results  Component Value Date   WBC 18.4 (H) 03/21/2024   HGB 14.3 03/21/2024   HCT 41.2 03/21/2024   MCV 84.9 03/21/2024   PLT 208 03/21/2024      Latest Ref Rng & Units 01/09/2024    9:43 AM  CMP  Glucose 70 - 99 mg/dL 79   BUN 6 - 20 mg/dL 8   Creatinine 9.42 - 8.99 mg/dL 9.45   Sodium 865 - 855 mmol/L 138   Potassium 3.5 - 5.2 mmol/L 4.3   Chloride 96 - 106 mmol/L 102   CO2 20 - 29 mmol/L 20   Calcium 8.7 - 10.2 mg/dL 8.9   Total Protein 6.0 - 8.5 g/dL 6.6   Total Bilirubin 0.0 - 1.2 mg/dL 0.3   Alkaline Phos 44 - 121 IU/L 149   AST 0 - 40 IU/L 30   ALT 0 - 32 IU/L 33    Edinburgh Score:     No data to display         No data recorded  After visit meds:  Allergies as of 03/21/2024   No Known Allergies   Med Rec must be completed prior to using this [facility]***  Discharge home in stable condition Infant Feeding: {Baby feeding:23562} Infant Disposition:{CHL IP OB HOME WITH FNUYZM:76418} Discharge instruction: per After Visit Summary and Postpartum booklet. Activity: Advance as tolerated. Pelvic rest for 6 weeks.  Diet: routine diet Future Appointments:No future appointments. Follow up Visit:  Loreli Suzen BIRCH, CNM  P Ft Admin Pool Replies will be sent to [employer] Please schedule this patient for Postpartum visit in: 4 weeks with the following provider: Any provider In-Person For C/S patients schedule nurse incision check in weeks 2 weeks: no High risk pregnancy complicated by: severe FGR, risk for T21, active Hep c Delivery mode:  VBAC Anticipated Birth Control:  BTL done PP (planning on this) PP Procedures needed:  none Schedule Integrated BH visit: no   03/21/2024 Suzen BIRCH Loreli, CNM

## 2024-03-21 NOTE — Anesthesia Preprocedure Evaluation (Signed)
 Anesthesia Evaluation  Patient identified by MRN, date of birth, ID band Patient awake    Reviewed: Allergy & Precautions, H&P , NPO status , Patient's Chart, lab work & pertinent test results  Airway Mallampati: II  TM Distance: >3 FB Neck ROM: Full    Dental no notable dental hx.    Pulmonary asthma , Current Smoker   Pulmonary exam normal breath sounds clear to auscultation       Cardiovascular Normal cardiovascular exam+ dysrhythmias Atrial Fibrillation  Rhythm:Regular Rate:Normal  TEE in 2023 to rule out endocarditis in the setting of bacteremia:  IMPRESSIONS     1. Left ventricular ejection fraction, by estimation, is 60 to 65%. The  left ventricle has normal function. The left ventricle has no regional  wall motion abnormalities.   2. Right ventricular systolic function is normal. The right ventricular  size is normal.   3. No left atrial/left atrial appendage thrombus was detected.   4. The mitral valve is normal in structure. Trivial mitral valve  regurgitation. No evidence of mitral stenosis.   5. The aortic valve is tricuspid. Aortic valve regurgitation is not  visualized. No aortic stenosis is present.   6. The inferior vena cava is normal in size with greater than 50%  respiratory variability, suggesting right atrial pressure of 3 mmHg.     Neuro/Psych  PSYCHIATRIC DISORDERS Anxiety Depression    negative neurological ROS     GI/Hepatic negative GI ROS,,,(+)     substance abuse  , Hepatitis -, C  Endo/Other  negative endocrine ROS    Renal/GU negative Renal ROS  negative genitourinary   Musculoskeletal  (+) Arthritis ,  narcotic dependent  Abdominal   Peds negative pediatric ROS (+)  Hematology  (+) Blood dyscrasia, anemia   Anesthesia Other Findings Delivery on side of road at 330 this morning. Hx of c-section in prior pregnancy  Reproductive/Obstetrics                               Anesthesia Physical Anesthesia Plan  ASA: 3  Anesthesia Plan: Spinal   Post-op Pain Management: Precedex  and Ofirmev  IV (intra-op)*   Induction:   PONV Risk Score and Plan: 2 and Treatment may vary due to age or medical condition and Ondansetron   Airway Management Planned: Natural Airway  Additional Equipment: None  Intra-op Plan:   Post-operative Plan:   Informed Consent: I have reviewed the patients History and Physical, chart, labs and discussed the procedure including the risks, benefits and alternatives for the proposed anesthesia with the patient or authorized representative who has indicated his/her understanding and acceptance.     Dental advisory given  Plan Discussed with: CRNA  Anesthesia Plan Comments:          Anesthesia Quick Evaluation

## 2024-03-21 NOTE — Progress Notes (Signed)
 Chaplain introduced spiritual care and offered support at bedside. Chaplain acknowledged the difficulties of the last few days as well as history of loss. Jeanenne nodded tearfully. Dorthula had also just returned from surgery and was experiencing significant pain. FOB was headed up to baby Roy's bedside. Chaplain and parents made plan to follow up on Monday and parents are aware that they can also call for support in the meantime.  Please page as further needs arise.  Alan HERO. Davee Lomax, M.Div. [facility] Chaplain Pager [phone] Office [phone]

## 2024-03-21 NOTE — Anesthesia Postprocedure Evaluation (Signed)
 Anesthesia Post Note  Patient: Krystal Valdez  Procedure(s) Performed: LIGATION, FALLOPIAN TUBE, POSTPARTUM (Bilateral: Abdomen)     Patient location during evaluation: PACU Anesthesia Type: Spinal and General Level of consciousness: awake and alert Pain management: pain level controlled Vital Signs Assessment: post-procedure vital signs reviewed and stable Respiratory status: spontaneous breathing, nonlabored ventilation, respiratory function stable and patient connected to nasal cannula oxygen Cardiovascular status: blood pressure returned to baseline and stable Postop Assessment: no apparent nausea or vomiting Anesthetic complications: no   No notable events documented.  Last Vitals:  Vitals:   03/21/24 1400 03/21/24 1415  BP: 118/75 123/77  Pulse: (!) 52 (!) 50  Resp: (!) 9 10  Temp:    SpO2: 99% 100%    Last Pain:  Vitals:   03/21/24 1419  TempSrc:   PainSc: 5     LLE Motor Response: Purposeful movement (03/21/24 1415) LLE Sensation: Increased (03/21/24 1415) RLE Motor Response: Purposeful movement (03/21/24 1415) RLE Sensation: Increased (03/21/24 1415)      Thom JONELLE Peoples

## 2024-03-21 NOTE — MAU Note (Signed)
 Krystal Valdez is a 28 y.o. at [redacted]w[redacted]d here in MAU reporting:   Arrived by EMS  Pt states she was at home and was having ctxs 7-8 min apart. She lives in [address] and tried to come by car but their car got stuck. Pt called 911/EMS. Pt states SROM at 0310 and pt ended up having baby en route to hospital at 0320. Pt and EMS reports no placenta out yet.   Nicu team  and women's AC present on arrival.   Baby is in NICU.    Vitals:   03/21/24 0457  BP: (!) 145/92  Pulse: 67  Resp: 19  Temp: (!) 97.5 F (36.4 C)  SpO2: 100%

## 2024-03-22 MED ORDER — NICOTINE 14 MG/24HR TD PT24: 14.0000 mg | MEDICATED_PATCH | Freq: Every day | TRANSDERMAL | 0 refills | Status: AC

## 2024-03-22 MED ORDER — IBUPROFEN 600 MG PO TABS: 600.0000 mg | ORAL_TABLET | Freq: Four times a day (QID) | ORAL | 0 refills | Status: AC | PRN

## 2024-03-22 NOTE — Clinical Social Work Maternal (Signed)
 CLINICAL SOCIAL WORK MATERNAL/CHILD NOTE  Patient Details  Name: Krystal Valdez MRN: [id number] Date of Birth: [birth year]  Date:  03/22/2024  Clinical Social Worker Initiating Note:  Nat Quiet, [place] Date/Time: Initiated:  03/22/24/0944     Child's Name:  Gaither Leigh Raddle.   Biological Parents:  Mother, Father (Mother: Reily Treloar December 06, 1993, Father: Gaither Leigh 02/06/1984)   Need for Interpreter:  None   Reason for Referral:  Other (Comment) (NICU infant; no custody of older kids; suboxone  use)   Address:  [address] [place], [address]   [address] [place] [address]    Phone number:  [phone] (home)     Additional phone number:   Household Members/Support Persons (HM/SP):   Household Member/Support Person 1, Household Member/Support Person 2   HM/SP Name Relationship DOB or Age  HM/SP -1 Gaither Leigh Significant Other 02-06-1984  HM/SP -2 [place] Daughter 10/27/2022  HM/SP -3        HM/SP -4        HM/SP -5        HM/SP -6        HM/SP -7        HM/SP -8          Natural Supports (not living in the home):  Extended Family, Immediate Family, Parent   Professional Supports: Other (Comment) (Suboxone  Treatment w/ [facility]. Mliss Louder)   Employment: Unemployed   Type of Work:     Education:  9 to 11 years   Homebound arranged: No  Financial Resources:  Medicaid   Other Resources:      Cultural/Religious Considerations Which May Impact Care:    Strengths:  Ability to meet basic needs  , Merchandiser, retail, Other  (Comment) (Suboxone  Treatment)   Psychotropic Medications:         Pediatrician:    [place] (including [place])  Pediatrician List:   [facility]  [facility])    Pediatrician Fax Number:    Risk Factors/Current Problems:  Substance Use     Cognitive State:  Alert  , Able to Concentrate      Mood/Affect:  Calm  , Interested  , Tearful     CSW Assessment: CSW received consult for NICU infant; no custody of older kids; suboxone  use. CSW met with MOB to offer support and complete assessment.   CSW met with MOB at bedside and introduced CSW role. MOB appeared calm, pleasant and welcomed CSW visit.  MOB reported that FOB stepped out of the room to complete the infant's birth certificate and planned to return. CSW offered MOB privacy and asked if FOB remain outside the room until the assessment was complete. MOB agreed. CSW inquired if MOB's demographic information on file was correct. MOB provided her current address, [address], [place] [address] and confirmed that her contact information on hospital file was correct. CSW inquired about MOB's housing situation. MOB reported that she lives with FOB Justo Leigh) and their one-year-old [place]) daughter. She reported that she is currently unemployed, while FOB works at [employer]. She reported that she receives [facility] benefits and FOB receives SNAP benefits. MOB shared that she has two children that are in the custody of her father Astrid Jacob) and grandmother Alvester Daring). CSW inquired if CPS was involved with the placement of her children.  MOB reported that [facility] CPS was involved due to her previous drug use to cope with the death of her child. MOB tearfully expressed that her son's NICU admission has been bringing up "lots of bad memories" and shared her daughter had seizures. CSW validated MOB's emotions, acknowledging the fear is experiencing. MOB shared that the infant's NICU nurse has been great at explaining things, so they can understand baby's plan of care. MOB expressed that her baby's diagnosis (suspected T21) has been hard to accept and shared her fears about whether he will live a normal life. She expressed that she would be possible to ensure his well-being. MOB asked CSW if the nurses would teach her how to care for him.  CSW validated MOB's feelings and encouraged MOB to ask NICU staff questions whenever she feels uncertain and reminded her support is available. CSW inquired about MOB's supports system. MOB identified FOB, her grandmother, her father, as primary sources of support.  CSW acknowledged MOB's emotions and inquired about her mental health. MOB reported shared that she has a history of depression. She reported that she does not take medication but uses her coping strategies such as taking breaths, talking about her feelings and listening to music. CSW encouraged MOB's to using her coping strategies. MOB reported that she had postpartum depression with her older children but stated she did not seek treatment. CSW provided education regarding the baby blues period vs. perinatal mood disorders, discussed treatment and gave resources for mental health follow up if concerns arise.  CSW recommended MOB complete a self-evaluation during the postpartum time period using the New Mom Checklist from Postpartum Progress and encouraged MOB to contact a medical professional if symptoms are noted at any time.  CSW assessed MOB for safety. MOB denied SI/HI and domestic violence concerns.  CSW inquired about MOB's Suboxone  treatment. MOB confirmed that she takes Suboxone  8mg , three times a day. She reported that she is followed by Mliss Louder with [facility] and has an appointment every Thursday. She reported that she would miss her appointment today but has enough Suboxone  to last her until her next appointment on Thursday.  CSW informed MOB that infant's urine drug screen was positive for amphetamines and THC. CSW inquired about MOB's substance use during the pregnancy. MOB disclosed that she snorted "phetamines" two weeks ago. 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 CSW asked MOB how often she used methamphetamines during the pregnancy.  MOB reported that she used "one or two times" before two weeks ago. CSW asked MOB the reason for her use. MOB stated, "I don't know why I used." CSW asked MOB if she used around her child. MOB denied using substance around her daughter and stated her daughter was in the care of her father when she used.  CSW reviewed the hospital drug screen policy and informed MOB that a report to CPS would be made. MOB tearfully expressed understanding and that she did not want CPS to take her son. MOB stated, "I messed up and I don't plan to use again." CSW acknowledged MOB's statement and asked if she was interested in residential treatment. MOB reported that she was interested in treatment and would go to [facility] for services but wanted to see what CPS would decide first. CSW informed MOB that CSW would continue to monitor the infant's CDS and report findings to CPS. MOB verbalized understanding.  CSW asked MOB  if she had essential items to care for her infant. MOB reported that that she had some infant items, but her family was going to purchase a car seat and crib. CSW asked MOB if she had chosen a pediatrician for the infant. MOB reported [facility].  CSW provided SSI information for MOB to follow up if infant diagnosis (T21) is confirmed. MOB reported that she is familiar with the process since she had to complete SSI for her daughter's seizures. CSW informed MOB that CSW would continue to check in and offer support while her infant remains in the NICU. MOB was receptive.  CSW made a report to [facility] CPS that infant's UDS was positive for amphetamines and THC. CSW will continue to follow the infant's CDS and make a report to CPS.   CSW Plan/Description:  Psychosocial Support and Ongoing Assessment of Needs, Perinatal Mood and Anxiety Disorder (PMADs) Education, No Further Intervention Required/No Barriers to Discharge, Hospital Drug Screen Policy Information, Child Protective Service Report   , CSW Will Continue to Monitor Umbilical Cord Tissue Drug Screen Results and Make Report if Warranted, Other Information/Referral to [facility], LCSW 03/22/2024, 12:12 PM

## 2024-03-25 LAB — SURGICAL PATHOLOGY

## 2024-04-05 ENCOUNTER — Telehealth (HOSPITAL_COMMUNITY): Payer: Self-pay | Admitting: *Deleted

## 2024-04-05 NOTE — Telephone Encounter (Signed)
 04/05/2024  Name: SHATIRA DOBOSZ MRN: [id number] DOB: November 04, 1995  Reason for Call:  Transition of Care Hospital Discharge Call  Contact Status: Patient Contact Status: Complete  Language assistant needed: Interpreter Mode: Interpreter Not Needed        Follow-Up Questions: Do You Have Any Concerns About Your Health As You Heal From Delivery?: No Do You Have Any Concerns About Your Infants Health?: Infant in NICU  Edinburgh Postnatal Depression Scale:  In the Past 7 Days: I have been able to laugh and see the funny side of things.: As much as I always could I have looked forward with enjoyment to things.: As much as I ever did I have blamed myself unnecessarily when things went wrong.: Yes, some of the time I have been anxious or worried for no good reason.: Yes, sometimes I have felt scared or panicky for no good reason.: No, not much Things have been getting on top of me.: No, most of the time I have coped quite well I have been so unhappy that I have had difficulty sleeping.: Not very often I have felt sad or miserable.: Not very often I have been so unhappy that I have been crying.: Only occasionally The thought of harming myself has occurred to me.: Never Edinburgh Postnatal Depression Scale Total: 9  PHQ2-9 Depression Scale:     Discharge Follow-up: Edinburgh score requires follow up?: No Patient was advised of the following resources:: Support Group, Breastfeeding Support Group  Post-discharge interventions: Reviewed Newborn Safe Sleep Practices  Mliss Sieve, RN 04/05/2024 11:57

## 2024-04-09 ENCOUNTER — Inpatient Hospital Stay (HOSPITAL_COMMUNITY): Admit: 2024-04-09

## 2024-04-29 ENCOUNTER — Encounter (HOSPITAL_COMMUNITY): Payer: Self-pay | Admitting: Obstetrics and Gynecology

## 2024-05-01 ENCOUNTER — Ambulatory Visit: Payer: MEDICAID | Admitting: Advanced Practice Midwife

## 2024-05-10 ENCOUNTER — Encounter (HOSPITAL_COMMUNITY): Payer: Self-pay | Admitting: Obstetrics and Gynecology
